# Patient Record
Sex: Female | Born: 1940 | Race: White | Hispanic: No | Smoking: Never smoker
Health system: Southern US, Community
[De-identification: ages and names within clinical notes are randomized; demographics above are authoritative.]

## PROBLEM LIST (undated history)

## (undated) DIAGNOSIS — R030 Elevated blood-pressure reading, without diagnosis of hypertension: Secondary | ICD-10-CM

## (undated) DIAGNOSIS — E559 Vitamin D deficiency, unspecified: Secondary | ICD-10-CM

## (undated) DIAGNOSIS — J45909 Unspecified asthma, uncomplicated: Secondary | ICD-10-CM

## (undated) DIAGNOSIS — T8859XA Other complications of anesthesia, initial encounter: Secondary | ICD-10-CM

## (undated) DIAGNOSIS — R7303 Prediabetes: Secondary | ICD-10-CM

## (undated) DIAGNOSIS — IMO0001 Reserved for inherently not codable concepts without codable children: Secondary | ICD-10-CM

## (undated) DIAGNOSIS — M1990 Unspecified osteoarthritis, unspecified site: Secondary | ICD-10-CM

## (undated) DIAGNOSIS — R011 Cardiac murmur, unspecified: Secondary | ICD-10-CM

## (undated) HISTORY — DX: Elevated blood-pressure reading, without diagnosis of hypertension: R03.0

## (undated) HISTORY — PX: FOOT SURGERY: SHX648

## (undated) HISTORY — DX: Prediabetes: R73.03

## (undated) HISTORY — DX: Reserved for inherently not codable concepts without codable children: IMO0001

## (undated) HISTORY — DX: Vitamin D deficiency, unspecified: E55.9

## (undated) HISTORY — PX: TONSILLECTOMY: SUR1361

## (undated) HISTORY — PX: VAGINAL HYSTERECTOMY: SUR661

## (undated) HISTORY — PX: OTHER SURGICAL HISTORY: SHX169

## (undated) HISTORY — PX: APPENDECTOMY: SHX54

---

## 1999-10-14 ENCOUNTER — Other Ambulatory Visit: Admission: RE | Admit: 1999-10-14 | Discharge: 1999-10-14 | Payer: Self-pay | Admitting: Gynecology

## 2000-04-08 ENCOUNTER — Encounter: Admission: RE | Admit: 2000-04-08 | Discharge: 2000-04-08 | Payer: Self-pay | Admitting: Gynecology

## 2000-04-08 ENCOUNTER — Encounter: Payer: Self-pay | Admitting: Gynecology

## 2000-08-04 ENCOUNTER — Encounter: Payer: Self-pay | Admitting: *Deleted

## 2000-08-04 ENCOUNTER — Ambulatory Visit (HOSPITAL_COMMUNITY): Admission: RE | Admit: 2000-08-04 | Discharge: 2000-08-04 | Payer: Self-pay | Admitting: *Deleted

## 2000-10-26 ENCOUNTER — Other Ambulatory Visit: Admission: RE | Admit: 2000-10-26 | Discharge: 2000-10-26 | Payer: Self-pay | Admitting: Gynecology

## 2001-04-11 ENCOUNTER — Encounter: Payer: Self-pay | Admitting: Gynecology

## 2001-04-11 ENCOUNTER — Encounter: Admission: RE | Admit: 2001-04-11 | Discharge: 2001-04-11 | Payer: Self-pay | Admitting: Gynecology

## 2001-10-30 ENCOUNTER — Other Ambulatory Visit: Admission: RE | Admit: 2001-10-30 | Discharge: 2001-10-30 | Payer: Self-pay | Admitting: Gynecology

## 2002-04-02 ENCOUNTER — Emergency Department (HOSPITAL_COMMUNITY): Admission: EM | Admit: 2002-04-02 | Discharge: 2002-04-02 | Payer: Self-pay | Admitting: Emergency Medicine

## 2002-04-19 ENCOUNTER — Encounter: Admission: RE | Admit: 2002-04-19 | Discharge: 2002-04-19 | Payer: Self-pay | Admitting: Gynecology

## 2002-04-19 ENCOUNTER — Encounter: Payer: Self-pay | Admitting: Gynecology

## 2003-05-06 ENCOUNTER — Encounter: Admission: RE | Admit: 2003-05-06 | Discharge: 2003-05-06 | Payer: Self-pay | Admitting: Internal Medicine

## 2003-05-06 ENCOUNTER — Encounter: Payer: Self-pay | Admitting: Internal Medicine

## 2004-12-02 ENCOUNTER — Ambulatory Visit (HOSPITAL_COMMUNITY): Admission: RE | Admit: 2004-12-02 | Discharge: 2004-12-02 | Payer: Self-pay | Admitting: Internal Medicine

## 2005-03-22 ENCOUNTER — Encounter: Admission: RE | Admit: 2005-03-22 | Discharge: 2005-03-22 | Payer: Self-pay | Admitting: Internal Medicine

## 2005-04-14 ENCOUNTER — Ambulatory Visit (HOSPITAL_COMMUNITY): Admission: RE | Admit: 2005-04-14 | Discharge: 2005-04-14 | Payer: Self-pay | Admitting: *Deleted

## 2005-04-14 ENCOUNTER — Encounter (INDEPENDENT_AMBULATORY_CARE_PROVIDER_SITE_OTHER): Payer: Self-pay | Admitting: Specialist

## 2005-12-07 ENCOUNTER — Ambulatory Visit (HOSPITAL_COMMUNITY): Admission: RE | Admit: 2005-12-07 | Discharge: 2005-12-07 | Payer: Self-pay | Admitting: Internal Medicine

## 2006-07-21 ENCOUNTER — Encounter: Admission: RE | Admit: 2006-07-21 | Discharge: 2006-07-21 | Payer: Self-pay | Admitting: Internal Medicine

## 2006-08-12 ENCOUNTER — Encounter: Admission: RE | Admit: 2006-08-12 | Discharge: 2006-08-12 | Payer: Self-pay | Admitting: Internal Medicine

## 2006-12-13 ENCOUNTER — Ambulatory Visit (HOSPITAL_COMMUNITY): Admission: RE | Admit: 2006-12-13 | Discharge: 2006-12-13 | Payer: Self-pay | Admitting: Internal Medicine

## 2007-12-15 ENCOUNTER — Ambulatory Visit (HOSPITAL_COMMUNITY): Admission: RE | Admit: 2007-12-15 | Discharge: 2007-12-15 | Payer: Self-pay | Admitting: Internal Medicine

## 2008-05-23 ENCOUNTER — Other Ambulatory Visit: Admission: RE | Admit: 2008-05-23 | Discharge: 2008-05-23 | Payer: Self-pay | Admitting: Internal Medicine

## 2008-05-29 ENCOUNTER — Encounter: Admission: RE | Admit: 2008-05-29 | Discharge: 2008-05-29 | Payer: Self-pay | Admitting: Internal Medicine

## 2008-12-17 ENCOUNTER — Ambulatory Visit (HOSPITAL_COMMUNITY): Admission: RE | Admit: 2008-12-17 | Discharge: 2008-12-17 | Payer: Self-pay | Admitting: Internal Medicine

## 2009-05-26 ENCOUNTER — Other Ambulatory Visit: Admission: RE | Admit: 2009-05-26 | Discharge: 2009-05-26 | Payer: Self-pay | Admitting: Internal Medicine

## 2009-12-18 ENCOUNTER — Ambulatory Visit (HOSPITAL_COMMUNITY): Admission: RE | Admit: 2009-12-18 | Discharge: 2009-12-18 | Payer: Self-pay | Admitting: Internal Medicine

## 2010-07-16 ENCOUNTER — Emergency Department (HOSPITAL_COMMUNITY): Admission: EM | Admit: 2010-07-16 | Discharge: 2010-01-27 | Payer: Self-pay | Admitting: Emergency Medicine

## 2010-11-16 ENCOUNTER — Other Ambulatory Visit (HOSPITAL_COMMUNITY): Payer: Self-pay | Admitting: Internal Medicine

## 2010-11-16 DIAGNOSIS — Z1231 Encounter for screening mammogram for malignant neoplasm of breast: Secondary | ICD-10-CM

## 2010-12-21 ENCOUNTER — Ambulatory Visit (HOSPITAL_COMMUNITY)
Admission: RE | Admit: 2010-12-21 | Discharge: 2010-12-21 | Disposition: A | Discharge status: Home / Self Care | Payer: Medicare Other | Source: Ambulatory Visit | Attending: Internal Medicine | Admitting: Internal Medicine

## 2010-12-21 DIAGNOSIS — Z1231 Encounter for screening mammogram for malignant neoplasm of breast: Secondary | ICD-10-CM | POA: Insufficient documentation

## 2010-12-25 NOTE — Op Note (Signed)
NAME:  Angelica Romero             ACCOUNT NO.:  [id number]   MEDICAL RECORD NO.:  [id number]          PATIENT TYPE:  AMB   LOCATION:  ENDO                         FACILITY:  [facility]   PHYSICIAN:  Georgiana Spinner, M.D.    DATE OF BIRTH:  May 29, 1941   DATE OF PROCEDURE:  04/14/2005  DATE OF DISCHARGE:                                 OPERATIVE REPORT   Upper endoscopy.   INDICATIONS:  GERD.   ANESTHESIA:  Demerol 50, Versed 4 mg.   PROCEDURE:  With patient mildly sedated in the left lateral decubitus  position, the Olympus videoscopic endoscope was inserted in the mouth,  passed under direct vision through the esophagus which appeared normal until  we reached the distal esophagus, and there were changes of esophagitis with  an ulcer and erythema, photographed and biopsied.  We entered into the  stomach.  Fundus, body, antrum, duodenal bulb, second portion of duodenum  were visualized.  From this point, the endoscope was slowly withdrawn,  taking circumferential views of duodenal mucosa until the endoscope had been  pulled back in the stomach, placed in retroflexion to view the stomach from  below.  The endoscope was straightened, withdrawn, taking circumferential  views of the remaining gastric and esophageal mucosa.  The patient's vital  signs and pulse oximeter remained stable.  The patient tolerated the  procedure well without apparent complications.   FINDINGS:  Esophagitis, biopsied.  Await biopsy report.  The patient will  call me for results and follow-up with me as an outpatient.           ______________________________  Georgiana Spinner, M.D.     GMO/MEDQ  D:  04/14/2005  T:  04/14/2005  Job:  [id number]

## 2010-12-25 NOTE — Op Note (Signed)
NAME:  Angelica Romero             ACCOUNT NO.:  [id number]   MEDICAL RECORD NO.:  [id number]          PATIENT TYPE:  AMB   LOCATION:  ENDO                         FACILITY:  [facility]   PHYSICIAN:  Georgiana Spinner, M.D.    DATE OF BIRTH:  Dec 15, 1940   DATE OF PROCEDURE:  04/14/2005  DATE OF DISCHARGE:                                 OPERATIVE REPORT   PROCEDURE:  Colonoscopy.   INDICATIONS:  Colon cancer screening.   ANESTHESIA:  Demerol 30, Versed 2 mg additionally.   DESCRIPTION OF PROCEDURE:  With the patient mildly sedated in the left  lateral decubitus position, the Olympus videoscopic colonoscope was inserted  into the rectum and passed under direct vision to the cecum identified by  the ileocecal valve and appendiceal orifice both of which were photographed.  From this point, the colonoscope was slowly withdrawn taking circumferential  views of colonic mucosa stopping in the rectum which appeared normal on  direct and showed hemorrhoids on retroflexed view. The endoscope was  straightened and withdrawn. The patient's vital signs and pulse oximeter  remained stable. The patient tolerated the procedure well without apparent  complications.   FINDINGS:  Internal hemorrhoids otherwise an unremarkable colonoscopic  examination to the cecum.   PLAN:  Consider repeat examination in 5-10 years.           ______________________________  Georgiana Spinner, M.D.     GMO/MEDQ  D:  04/14/2005  T:  04/14/2005  Job:  [id number]

## 2011-11-17 ENCOUNTER — Other Ambulatory Visit (HOSPITAL_COMMUNITY): Payer: Self-pay | Admitting: Internal Medicine

## 2011-11-17 DIAGNOSIS — Z1231 Encounter for screening mammogram for malignant neoplasm of breast: Secondary | ICD-10-CM

## 2011-12-22 ENCOUNTER — Ambulatory Visit (HOSPITAL_COMMUNITY)
Admission: RE | Admit: 2011-12-22 | Discharge: 2011-12-22 | Disposition: A | Discharge status: Home / Self Care | Payer: Medicare Other | Source: Ambulatory Visit | Attending: Internal Medicine | Admitting: Internal Medicine

## 2011-12-22 DIAGNOSIS — Z1231 Encounter for screening mammogram for malignant neoplasm of breast: Secondary | ICD-10-CM | POA: Insufficient documentation

## 2012-11-14 ENCOUNTER — Other Ambulatory Visit (HOSPITAL_COMMUNITY): Payer: Self-pay | Admitting: Internal Medicine

## 2012-11-14 DIAGNOSIS — Z1231 Encounter for screening mammogram for malignant neoplasm of breast: Secondary | ICD-10-CM

## 2012-12-25 ENCOUNTER — Ambulatory Visit (HOSPITAL_COMMUNITY)
Admission: RE | Admit: 2012-12-25 | Discharge: 2012-12-25 | Disposition: A | Discharge status: Home / Self Care | Payer: Medicare Other | Source: Ambulatory Visit | Attending: Internal Medicine | Admitting: Internal Medicine

## 2012-12-25 DIAGNOSIS — Z1231 Encounter for screening mammogram for malignant neoplasm of breast: Secondary | ICD-10-CM

## 2013-12-05 ENCOUNTER — Other Ambulatory Visit (HOSPITAL_COMMUNITY): Payer: Self-pay | Admitting: Internal Medicine

## 2013-12-05 DIAGNOSIS — Z1231 Encounter for screening mammogram for malignant neoplasm of breast: Secondary | ICD-10-CM

## 2013-12-27 ENCOUNTER — Ambulatory Visit (HOSPITAL_COMMUNITY)
Admission: RE | Admit: 2013-12-27 | Discharge: 2013-12-27 | Disposition: A | Discharge status: Home / Self Care | Payer: Medicare Other | Source: Ambulatory Visit | Attending: Internal Medicine | Admitting: Internal Medicine

## 2013-12-27 DIAGNOSIS — Z1231 Encounter for screening mammogram for malignant neoplasm of breast: Secondary | ICD-10-CM | POA: Insufficient documentation

## 2014-07-01 ENCOUNTER — Other Ambulatory Visit: Payer: Self-pay | Admitting: Internal Medicine

## 2014-07-01 DIAGNOSIS — I779 Disorder of arteries and arterioles, unspecified: Secondary | ICD-10-CM

## 2014-07-15 ENCOUNTER — Ambulatory Visit
Admission: RE | Admit: 2014-07-15 | Discharge: 2014-07-15 | Disposition: A | Discharge status: Home / Self Care | Payer: Medicare Other | Source: Ambulatory Visit | Attending: Internal Medicine | Admitting: Internal Medicine

## 2014-07-15 DIAGNOSIS — I779 Disorder of arteries and arterioles, unspecified: Secondary | ICD-10-CM

## 2014-08-28 DIAGNOSIS — H524 Presbyopia: Secondary | ICD-10-CM | POA: Diagnosis not present

## 2014-08-28 DIAGNOSIS — H40013 Open angle with borderline findings, low risk, bilateral: Secondary | ICD-10-CM | POA: Diagnosis not present

## 2014-08-28 DIAGNOSIS — H1851 Endothelial corneal dystrophy: Secondary | ICD-10-CM | POA: Diagnosis not present

## 2014-09-05 DIAGNOSIS — M6740 Ganglion, unspecified site: Secondary | ICD-10-CM | POA: Diagnosis not present

## 2014-09-05 DIAGNOSIS — L570 Actinic keratosis: Secondary | ICD-10-CM | POA: Diagnosis not present

## 2014-09-05 DIAGNOSIS — L820 Inflamed seborrheic keratosis: Secondary | ICD-10-CM | POA: Diagnosis not present

## 2014-12-02 ENCOUNTER — Other Ambulatory Visit (HOSPITAL_COMMUNITY): Payer: Self-pay | Admitting: Family Medicine

## 2014-12-02 DIAGNOSIS — Z1231 Encounter for screening mammogram for malignant neoplasm of breast: Secondary | ICD-10-CM

## 2014-12-30 ENCOUNTER — Ambulatory Visit (HOSPITAL_COMMUNITY): Payer: Medicare Other

## 2014-12-30 ENCOUNTER — Ambulatory Visit (HOSPITAL_COMMUNITY)
Admission: RE | Admit: 2014-12-30 | Discharge: 2014-12-30 | Disposition: A | Discharge status: Home / Self Care | Payer: Medicare Other | Source: Ambulatory Visit | Attending: Family Medicine | Admitting: Family Medicine

## 2014-12-30 DIAGNOSIS — Z1231 Encounter for screening mammogram for malignant neoplasm of breast: Secondary | ICD-10-CM | POA: Diagnosis not present

## 2015-01-02 ENCOUNTER — Ambulatory Visit (INDEPENDENT_AMBULATORY_CARE_PROVIDER_SITE_OTHER): Payer: Medicare Other | Admitting: Internal Medicine

## 2015-01-02 ENCOUNTER — Encounter: Payer: Self-pay | Admitting: Internal Medicine

## 2015-01-02 VITALS — BP 140/78 | HR 76 | Temp 97.8°F | Resp 16 | Ht 62.25 in | Wt 193.6 lb

## 2015-01-02 DIAGNOSIS — E7800 Pure hypercholesterolemia, unspecified: Secondary | ICD-10-CM

## 2015-01-02 DIAGNOSIS — R030 Elevated blood-pressure reading, without diagnosis of hypertension: Secondary | ICD-10-CM

## 2015-01-02 DIAGNOSIS — D509 Iron deficiency anemia, unspecified: Secondary | ICD-10-CM

## 2015-01-02 DIAGNOSIS — R0989 Other specified symptoms and signs involving the circulatory and respiratory systems: Secondary | ICD-10-CM | POA: Insufficient documentation

## 2015-01-02 DIAGNOSIS — R7309 Other abnormal glucose: Secondary | ICD-10-CM

## 2015-01-02 DIAGNOSIS — Z79899 Other long term (current) drug therapy: Secondary | ICD-10-CM | POA: Diagnosis not present

## 2015-01-02 DIAGNOSIS — E559 Vitamin D deficiency, unspecified: Secondary | ICD-10-CM | POA: Diagnosis not present

## 2015-01-02 DIAGNOSIS — E60 Dietary zinc deficiency: Secondary | ICD-10-CM

## 2015-01-02 DIAGNOSIS — IMO0001 Reserved for inherently not codable concepts without codable children: Secondary | ICD-10-CM

## 2015-01-02 LAB — CBC WITH DIFFERENTIAL/PLATELET
BASOS PCT: 1 % (ref 0–1)
Basophils Absolute: 0.1 10*3/uL (ref 0.0–0.1)
EOS ABS: 0.1 10*3/uL (ref 0.0–0.7)
EOS PCT: 2 % (ref 0–5)
HEMATOCRIT: 44.2 % (ref 36.0–46.0)
Hemoglobin: 15.1 g/dL — ABNORMAL HIGH (ref 12.0–15.0)
LYMPHS ABS: 2.3 10*3/uL (ref 0.7–4.0)
Lymphocytes Relative: 43 % (ref 12–46)
MCH: 30.4 pg (ref 26.0–34.0)
MCHC: 34.2 g/dL (ref 30.0–36.0)
MCV: 88.9 fL (ref 78.0–100.0)
MONO ABS: 0.6 10*3/uL (ref 0.1–1.0)
MPV: 9.6 fL (ref 8.6–12.4)
Monocytes Relative: 12 % (ref 3–12)
NEUTROS ABS: 2.3 10*3/uL (ref 1.7–7.7)
Neutrophils Relative %: 42 % — ABNORMAL LOW (ref 43–77)
Platelets: 246 10*3/uL (ref 150–400)
RBC: 4.97 MIL/uL (ref 3.87–5.11)
RDW: 14.8 % (ref 11.5–15.5)
WBC: 5.4 10*3/uL (ref 4.0–10.5)

## 2015-01-02 LAB — BASIC METABOLIC PANEL WITH GFR
BUN: 16 mg/dL (ref 6–23)
CALCIUM: 9.9 mg/dL (ref 8.4–10.5)
CHLORIDE: 107 meq/L (ref 96–112)
CO2: 26 mEq/L (ref 19–32)
Creat: 0.75 mg/dL (ref 0.50–1.10)
GFR, Est African American: >89 mL/min
GFR, Est Non African American: 78 mL/min
GLUCOSE: 98 mg/dL (ref 70–99)
POTASSIUM: 4.4 meq/L (ref 3.5–5.3)
Sodium: 142 mEq/L (ref 135–145)

## 2015-01-02 LAB — HEPATIC FUNCTION PANEL
ALBUMIN: 4.5 g/dL (ref 3.5–5.2)
ALK PHOS: 55 U/L (ref 39–117)
ALT: 30 U/L (ref 0–35)
AST: 31 U/L (ref 0–37)
Bilirubin, Direct: 0.1 mg/dL (ref 0.0–0.3)
Indirect Bilirubin: 0.4 mg/dL (ref 0.2–1.2)
TOTAL PROTEIN: 7.2 g/dL (ref 6.0–8.3)
Total Bilirubin: 0.5 mg/dL (ref 0.2–1.2)

## 2015-01-02 LAB — LIPID PANEL
CHOLESTEROL: 135 mg/dL (ref 0–200)
HDL: 47 mg/dL (ref >=46)
LDL Cholesterol: 77 mg/dL (ref 0–99)
Total CHOL/HDL Ratio: 2.9 Ratio
Triglycerides: 55 mg/dL (ref <150)
VLDL: 11 mg/dL (ref 0–40)

## 2015-01-02 LAB — TSH: TSH: 1.326 u[IU]/mL (ref 0.350–4.500)

## 2015-01-02 LAB — IRON AND TIBC
%SAT: 33 % (ref 20–55)
Iron: 99 ug/dL (ref 42–145)
TIBC: 302 ug/dL (ref 250–470)
UIBC: 203 ug/dL (ref 125–400)

## 2015-01-02 LAB — MAGNESIUM: Magnesium: 2.3 mg/dL (ref 1.5–2.5)

## 2015-01-02 NOTE — Patient Instructions (Signed)
Recommend Adult Low dose Aspirin or baby Aspirin 81 mg daily   To reduce risk of Colon Cancer 20 %,   Skin Cancer 26 % ,   Melanoma 46%   and   Pancreatic cancer 60%  ++++++++++++++++++  Vitamin D goal is between 70-100.   Please make sure that you are taking your Vitamin D as directed.   It is very important as a natural anti-inflammatory   helping hair, skin, and nails, as well as reducing stroke and heart attack risk.   It helps your bones and helps with mood.  It also decreases numerous cancer risks so please take it as directed.   Low Vit D is associated with a 200-300% higher risk for CANCER   and 200-300% higher risk for HEART   ATTACK  &  STROKE.    ......................................  It is also associated with higher death rate at younger ages,   autoimmune diseases like Rheumatoid arthritis, Lupus, Multiple Sclerosis.     Also many other serious conditions, like depression, Alzheimer's  Dementia, infertility, muscle aches, fatigue, fibromyalgia - just to name a few.  +++++++++++++++++++    Recommend the book "The END of DIETING" by Dr Joel Fuhrman   & the book "The END of DIABETES " by Dr Joel Fuhrman  At [employer] - get book & Audio CD's     Being diabetic has a  300% increased risk for heart attack, stroke, cancer, and alzheimer- type vascular dementia. It is very important that you work harder with diet by avoiding all foods that are white. Avoid white rice (brown & wild rice is OK), white potatoes (sweetpotatoes in moderation is OK), White bread or wheat bread or anything made out of white flour like bagels, donuts, rolls, buns, biscuits, cakes, pastries, cookies, pizza crust, and pasta (made from white flour & egg whites) - vegetarian pasta or spinach or wheat pasta is OK. Multigrain breads like Arnold's or [employer], or multigrain sandwich thins or flatbreads.  Diet, exercise and weight loss can reverse and cure diabetes in the early  stages.  Diet, exercise and weight loss is very important in the control and prevention of complications of diabetes which affects every system in your body, ie. Brain - dementia/stroke, eyes - glaucoma/blindness, heart - heart attack/heart failure, kidneys - dialysis, stomach - gastric paralysis, intestines - malabsorption, nerves - severe painful neuritis, circulation - gangrene & loss of a leg(s), and finally cancer and Alzheimers.    I recommend avoid fried & greasy foods,  sweets/candy, white rice (brown or wild rice or Quinoa is OK), white potatoes (sweet potatoes are OK) - anything made from white flour - bagels, doughnuts, rolls, buns, biscuits,white and wheat breads, pizza crust and traditional pasta made of white flour & egg white(vegetarian pasta or spinach or wheat pasta is OK).  Multi-grain bread is OK - like multi-grain flat bread or sandwich thins. Avoid alcohol in excess. Exercise is also important.    Eat all the vegetables you want - avoid meat, especially red meat and dairy - especially cheese.  Cheese is the most concentrated form of trans-fats which is the worst thing to clog up our arteries. Veggie cheese is OK which can be found in the fresh produce section at [employer] or [employer] or [employer]  ++++++++++++++++++++++++++  Preventive Care for Adults  A healthy lifestyle and preventive care can promote health and wellness. Preventive health guidelines for women include the following key practices.  A routine yearly physical is   a good way to check with your health care provider about your health and preventive screening. It is a chance to share any concerns and updates on your health and to receive a thorough exam.  Visit your dentist for a routine exam and preventive care every 6 months. Brush your teeth twice a day and floss once a day. Good oral hygiene prevents tooth decay and gum disease.  The frequency of eye exams is based on your age, health, family medical  history, use of contact lenses, and other factors. Follow your health care provider's recommendations for frequency of eye exams.  Eat a healthy diet. Foods like vegetables, fruits, whole grains, low-fat dairy products, and lean protein foods contain the nutrients you need without too many calories. Decrease your intake of foods high in solid fats, added sugars, and salt. Eat the right amount of calories for you.Get information about a proper diet from your health care provider, if necessary.  Regular physical exercise is one of the most important things you can do for your health. Most adults should get at least 150 minutes of moderate-intensity exercise (any activity that increases your heart rate and causes you to sweat) each week. In addition, most adults need muscle-strengthening exercises on 2 or more days a week.  Maintain a healthy weight. The body mass index (BMI) is a screening tool to identify possible weight problems. It provides an estimate of body fat based on height and weight. Your health care provider can find your BMI and can help you achieve or maintain a healthy weight.For adults 20 years and older:  A BMI below 18.5 is considered underweight.  A BMI of 18.5 to 24.9 is normal.  A BMI of 25 to 29.9 is considered overweight.  A BMI of 30 and above is considered obese.  Maintain normal blood lipids and cholesterol levels by exercising and minimizing your intake of saturated fat. Eat a balanced diet with plenty of fruit and vegetables. If your lipid or cholesterol levels are high, you are over 50, or you are at high risk for heart disease, you may need your cholesterol levels checked more frequently.Ongoing high lipid and cholesterol levels should be treated with medicines if diet and exercise are not working.  If you smoke, find out from your health care provider how to quit. If you do not use tobacco, do not start.  Lung cancer screening is recommended for adults aged 55-80  years who are at high risk for developing lung cancer because of a history of smoking. A yearly low-dose CT scan of the lungs is recommended for people who have at least a 30-pack-year history of smoking and are a current smoker or have quit within the past 15 years. A pack year of smoking is smoking an average of 1 pack of cigarettes a day for 1 year (for example: 1 pack a day for 30 years or 2 packs a day for 15 years). Yearly screening should continue until the smoker has stopped smoking for at least 15 years. Yearly screening should be stopped for people who develop a health problem that would prevent them from having lung cancer treatment.  Avoid use of street drugs. Do not share needles with anyone. Ask for help if you need support or instructions about stopping the use of drugs.  High blood pressure causes heart disease and increases the risk of stroke.  Ongoing high blood pressure should be treated with medicines if weight loss and exercise do not work.  If   you are 55-79 years old, ask your health care provider if you should take aspirin to prevent strokes.  Diabetes screening involves taking a blood sample to check your fasting blood sugar level. This should be done once every 3 years, after age 45, if you are within normal weight and without risk factors for diabetes. Testing should be considered at a younger age or be carried out more frequently if you are overweight and have at least 1 risk factor for diabetes.  Breast cancer screening is essential preventive care for women. You should practice "breast self-awareness." This means understanding the normal appearance and feel of your breasts and may include breast self-examination. Any changes detected, no matter how small, should be reported to a health care provider. Women in their 20s and 30s should have a clinical breast exam (CBE) by a health care provider as part of a regular health exam every 1 to 3 years. After age 40, women should have a  CBE every year. Starting at age 40, women should consider having a mammogram (breast X-ray test) every year. Women who have a family history of breast cancer should talk to their health care provider about genetic screening. Women at a high risk of breast cancer should talk to their health care providers about having an MRI and a mammogram every year.  Breast cancer gene (BRCA)-related cancer risk assessment is recommended for women who have family members with BRCA-related cancers. BRCA-related cancers include breast, ovarian, tubal, and peritoneal cancers. Having family members with these cancers may be associated with an increased risk for harmful changes (mutations) in the breast cancer genes BRCA1 and BRCA2. Results of the assessment will determine the need for genetic counseling and BRCA1 and BRCA2 testing.  Routine pelvic exams to screen for cancer are no longer recommended for nonpregnant women who are considered low risk for cancer of the pelvic organs (ovaries, uterus, and vagina) and who do not have symptoms. Ask your health care provider if a screening pelvic exam is right for you.  If you have had past treatment for cervical cancer or a condition that could lead to cancer, you need Pap tests and screening for cancer for at least 20 years after your treatment. If Pap tests have been discontinued, your risk factors (such as having a new sexual partner) need to be reassessed to determine if screening should be resumed. Some women have medical problems that increase the chance of getting cervical cancer. In these cases, your health care provider may recommend more frequent screening and Pap tests.    Colorectal cancer can be detected and often prevented. Most routine colorectal cancer screening begins at the age of 50 years and continues through age 75 years. However, your health care provider may recommend screening at an earlier age if you have risk factors for colon cancer. On a yearly basis,  your health care provider may provide home test kits to check for hidden blood in the stool. Use of a small camera at the end of a tube, to directly examine the colon (sigmoidoscopy or colonoscopy), can detect the earliest forms of colorectal cancer. Talk to your health care provider about this at age 50, when routine screening begins. Direct exam of the colon should be repeated every 5-10 years through age 75 years, unless early forms of pre-cancerous polyps or small growths are found.  Osteoporosis is a disease in which the bones lose minerals and strength with aging. This can result in serious bone fractures or breaks. The   risk of osteoporosis can be identified using a bone density scan. Women ages 65 years and over and women at risk for fractures or osteoporosis should discuss screening with their health care providers. Ask your health care provider whether you should take a calcium supplement or vitamin D to reduce the rate of osteoporosis.  Menopause can be associated with physical symptoms and risks. Hormone replacement therapy is available to decrease symptoms and risks. You should talk to your health care provider about whether hormone replacement therapy is right for you.  Use sunscreen. Apply sunscreen liberally and repeatedly throughout the day. You should seek shade when your shadow is shorter than you. Protect yourself by wearing long sleeves, pants, a wide-brimmed hat, and sunglasses year round, whenever you are outdoors.  Once a month, do a whole body skin exam, using a mirror to look at the skin on your back. Tell your health care provider of new moles, moles that have irregular borders, moles that are larger than a pencil eraser, or moles that have changed in shape or color.  Stay current with required vaccines (immunizations).  Influenza vaccine. All adults should be immunized every year.  Tetanus, diphtheria, and acellular pertussis (Td, Tdap) vaccine. Pregnant women should receive  1 dose of Tdap vaccine during each pregnancy. The dose should be obtained regardless of the length of time since the last dose. Immunization is preferred during the 27th-36th week of gestation. An adult who has not previously received Tdap or who does not know her vaccine status should receive 1 dose of Tdap. This initial dose should be followed by tetanus and diphtheria toxoids (Td) booster doses every 10 years. Adults with an unknown or incomplete history of completing a 3-dose immunization series with Td-containing vaccines should begin or complete a primary immunization series including a Tdap dose. Adults should receive a Td booster every 10 years.    Zoster vaccine. One dose is recommended for adults aged 60 years or older unless certain conditions are present.    Pneumococcal 13-valent conjugate (PCV13) vaccine. When indicated, a person who is uncertain of her immunization history and has no record of immunization should receive the PCV13 vaccine. An adult aged 19 years or older who has certain medical conditions and has not been previously immunized should receive 1 dose of PCV13 vaccine. This PCV13 should be followed with a dose of pneumococcal polysaccharide (PPSV23) vaccine. The PPSV23 vaccine dose should be obtained at least 8 weeks after the dose of PCV13 vaccine. An adult aged 19 years or older who has certain medical conditions and previously received 1 or more doses of PPSV23 vaccine should receive 1 dose of PCV13. The PCV13 vaccine dose should be obtained 1 or more years after the last PPSV23 vaccine dose.    Pneumococcal polysaccharide (PPSV23) vaccine. When PCV13 is also indicated, PCV13 should be obtained first. All adults aged 65 years and older should be immunized. An adult younger than age 65 years who has certain medical conditions should be immunized. Any person who resides in a nursing home or long-term care facility should be immunized. An adult smoker should be immunized.  People with an immunocompromised condition and certain other conditions should receive both PCV13 and PPSV23 vaccines. People with human immunodeficiency virus (HIV) infection should be immunized as soon as possible after diagnosis. Immunization during chemotherapy or radiation therapy should be avoided. Routine use of PPSV23 vaccine is not recommended for American Indians, [place] Natives, or people younger than 65 years unless there are medical   conditions that require PPSV23 vaccine. When indicated, people who have unknown immunization and have no record of immunization should receive PPSV23 vaccine. One-time revaccination 5 years after the first dose of PPSV23 is recommended for people aged 19-64 years who have chronic kidney failure, nephrotic syndrome, asplenia, or immunocompromised conditions. People who received 1-2 doses of PPSV23 before age 9 years should receive another dose of PPSV23 vaccine at age 62 years or later if at least 5 years have passed since the previous dose. Doses of PPSV23 are not needed for people immunized with PPSV23 at or after age 53 years.   Preventive Services / Frequency  Ages 32 years and over  Blood pressure check.  Lipid and cholesterol check.  Lung cancer screening. / Every year if you are aged 71-80 years and have a 30-pack-year history of smoking and currently smoke or have quit within the past 15 years. Yearly screening is stopped once you have quit smoking for at least 15 years or develop a health problem that would prevent you from having lung cancer treatment.  Clinical breast exam.** / Every year after age 65 years.  BRCA-related cancer risk assessment.** / For women who have family members with a BRCA-related cancer (breast, ovarian, tubal, or peritoneal cancers).  Mammogram.** / Every year beginning at age 46 years and continuing for as long as you are in good health. Consult with your health care provider.  Pap test.** / Every 3 years starting at  age 71 years through age 47 or 50 years with 3 consecutive normal Pap tests. Testing can be stopped between 65 and 70 years with 3 consecutive normal Pap tests and no abnormal Pap or HPV tests in the past 10 years.  Fecal occult blood test (FOBT) of stool. / Every year beginning at age 44 years and continuing until age 73 years. You may not need to do this test if you get a colonoscopy every 10 years.  Flexible sigmoidoscopy or colonoscopy.** / Every 5 years for a flexible sigmoidoscopy or every 10 years for a colonoscopy beginning at age 37 years and continuing until age 2 years.  Hepatitis C blood test.** / For all people born from [birth year] through 1967 and any individual with known risks for hepatitis C.  Osteoporosis screening.** / A one-time screening for women ages 9 years and over and women at risk for fractures or osteoporosis.  Skin self-exam. / Monthly.  Influenza vaccine. / Every year.  Tetanus, diphtheria, and acellular pertussis (Tdap/Td) vaccine.** / 1 dose of Td every 10 years.  Zoster vaccine.** / 1 dose for adults aged 31 years or older.  Pneumococcal 13-valent conjugate (PCV13) vaccine.** / Consult your health care provider.  Pneumococcal polysaccharide (PPSV23) vaccine.** / 1 dose for all adults aged 32 years and older. Screening for abdominal aortic aneurysm (AAA)  by ultrasound is recommended for people who have history of high blood pressure or who are current or former smokers.

## 2015-01-02 NOTE — Progress Notes (Signed)
Patient ID: Angelica Romero, female   DOB: [birth year], 74 y.o.   MRN: [id number]   This very nice 74 y.o. [name] presents to establish care.    Patient is on no treatment  for HTN, but is norted to have an elevated BP 140/78. She doe relate a very remote hx/o elevated BP during a pregnancy 40 +/- yrs ago. Patient has had no complaints of any cardiac type chest pain, palpitations, dyspnea/orthopnea/PND, dizziness, claudication, or dependent edema. She does not exercise.    Patient has limited insight to her medical status as to whether she's ever gad elevated Cholesterol, glucose or low Vitamin D.  Does relate 2 surgeries by Dr Ernesto Rutherford and in Dec 2015 he ordered Head CT scan which alleged was negative.   Current meds include Biotin 5000 mcg daily, Aleve 220mg  prn, bASA 81 mg daily, and Mag 400 mg daily.  Allergies  Allergen Reactions  . Codeine Nausea And Vomiting  . Hydrocodone Nausea And Vomiting  . Omeprazole Nausea And Vomiting   PMHx:   Immunization History  Administered Date(s) Administered  . Influenza-Unspecified 04/09/2014   FHx:    Non contributory  SHx:    Non contributory Systems Review:  Constitutional: Denies fever, chills, wt changes, headaches, insomnia, fatigue, night sweats, change in appetite. Eyes: Denies redness, blurred vision, diplopia, discharge, itchy, watery eyes.  ENT: Denies discharge, congestion, post nasal drip, epistaxis, sore throat, earache, hearing loss, dental pain, tinnitus, vertigo, sinus pain, snoring.  CV: Denies chest pain, palpitations, irregular heartbeat, syncope, dyspnea, diaphoresis, orthopnea, PND, claudication or edema. Respiratory: denies cough, dyspnea, DOE, pleurisy, hoarseness, laryngitis, wheezing.  Gastrointestinal: Denies dysphagia, odynophagia, heartburn, reflux, water brash, abdominal pain or cramps, nausea, vomiting, bloating, diarrhea, constipation, hematemesis, melena, hematochezia  or hemorrhoids. Genitourinary: Denies dysuria,  frequency, urgency, nocturia, hesitancy, discharge, hematuria or flank pain. Musculoskeletal: Denies arthralgias, myalgias, stiffness, jt. swelling, pain, limping or strain/sprain.  Skin: Denies pruritus, rash, hives, warts, acne, eczema or change in skin lesion(s). Neuro: No weakness, tremor, incoordination, spasms, paresthesia or pain. Psychiatric: Denies confusion, memory loss or sensory loss. Endo: Denies change in weight, skin or hair change.  Heme/Lymph: No excessive bleeding, bruising or enlarged lymph nodes.  Physical Exam  BP 140/78   Pulse 76  Temp 97.8 F  Resp 16  Ht 5' 2.25"   Wt 193 lb 9.6 oz     BMI 35.13   Appears over nourished and in no distress.  Eyes: PERRLA, EOMs, conjunctiva no swelling or erythema. Sinuses: No frontal/maxillary tenderness ENT/Mouth: EAC's clear, TM's nl w/o erythema, bulging. Nares clear w/o erythema, swelling, exudates. Oropharynx clear without erythema or exudates. Oral hygiene is good. Tongue normal, non obstructing. Hearing intact.  Neck: Supple. Thyroid nl. Car 2+/2+ without bruits, nodes or JVD. Chest: Respirations nl with BS clear & equal w/o rales, rhonchi, wheezing or stridor.  Cor: Heart sounds normal w/ regular rate and rhythm without sig. murmurs, gallops, clicks, or rubs. Peripheral pulses normal and equal  without edema.  Abdomen: Soft & bowel sounds normal. Non-tender w/o guarding, rebound, hernias, masses, or organomegaly.  Lymphatics: Unremarkable.  Musculoskeletal: Full ROM all peripheral extremities, joint stability, 5/5 strength, and normal gait.  Skin: Warm, dry without exposed rashes, lesions or ecchymosis apparent. Patient relates concern over increased hair loss.  Neuro: Cranial nerves intact, reflexes equal bilaterally. Sensory-motor testing grossly intact. Tendon reflexes grossly intact.  Pysch: Alert & oriented x 3.  Insight and judgement nl & appropriate. No ideations.  Assessment and Plan:  1. Elevated BP  -  TSH  2. Elevated cholesterol  - Lipid panel  3. Abnormal glucose  - Hemoglobin A1c - Insulin, random  4. Anemia, iron deficiency  - Iron and TIBC  5. Vitamin D deficiency  - Vit D  25 hydroxy   6. Zinc deficiency  - Zinc  7. Medication management  - CBC with Differential/Platelet - BASIC METABOLIC PANEL WITH GFR - Hepatic function panel - Magnesium   Recommended regular exercise, BP monitoring, weight control, and discussed med and SE's. Recommended labs to assess and monitor clinical status. Further disposition pending results of labs. Over 30 minutes of exam, counseling, chart review was performed

## 2015-01-03 LAB — HEMOGLOBIN A1C
Hgb A1c MFr Bld: 5.9 % — ABNORMAL HIGH (ref <5.7)
Mean Plasma Glucose: 123 mg/dL — ABNORMAL HIGH (ref <117)

## 2015-01-03 LAB — INSULIN, RANDOM: Insulin: 15 u[IU]/mL (ref 2.0–19.6)

## 2015-01-03 LAB — VITAMIN D 25 HYDROXY (VIT D DEFICIENCY, FRACTURES): VIT D 25 HYDROXY: 38 ng/mL (ref 30–100)

## 2015-01-07 LAB — ZINC: Zinc: 87 ug/dL (ref 60–130)

## 2015-01-14 ENCOUNTER — Encounter (INDEPENDENT_AMBULATORY_CARE_PROVIDER_SITE_OTHER): Payer: Self-pay

## 2015-02-10 ENCOUNTER — Encounter: Payer: Self-pay | Admitting: *Deleted

## 2015-05-29 DIAGNOSIS — H353130 Nonexudative age-related macular degeneration, bilateral, stage unspecified: Secondary | ICD-10-CM | POA: Diagnosis not present

## 2015-05-29 DIAGNOSIS — Z961 Presence of intraocular lens: Secondary | ICD-10-CM | POA: Diagnosis not present

## 2015-05-29 DIAGNOSIS — H40013 Open angle with borderline findings, low risk, bilateral: Secondary | ICD-10-CM | POA: Diagnosis not present

## 2015-05-29 DIAGNOSIS — H35033 Hypertensive retinopathy, bilateral: Secondary | ICD-10-CM | POA: Diagnosis not present

## 2015-07-31 ENCOUNTER — Ambulatory Visit (INDEPENDENT_AMBULATORY_CARE_PROVIDER_SITE_OTHER): Payer: Medicare Other | Admitting: Internal Medicine

## 2015-07-31 ENCOUNTER — Encounter: Payer: Self-pay | Admitting: Internal Medicine

## 2015-07-31 VITALS — BP 156/82 | HR 64 | Temp 97.4°F | Resp 16 | Ht 62.0 in | Wt 187.6 lb

## 2015-07-31 DIAGNOSIS — R7309 Other abnormal glucose: Secondary | ICD-10-CM | POA: Insufficient documentation

## 2015-07-31 DIAGNOSIS — R030 Elevated blood-pressure reading, without diagnosis of hypertension: Secondary | ICD-10-CM

## 2015-07-31 DIAGNOSIS — IMO0001 Reserved for inherently not codable concepts without codable children: Secondary | ICD-10-CM

## 2015-07-31 DIAGNOSIS — Z13220 Encounter for screening for lipoid disorders: Secondary | ICD-10-CM | POA: Diagnosis not present

## 2015-07-31 DIAGNOSIS — Z1331 Encounter for screening for depression: Secondary | ICD-10-CM

## 2015-07-31 DIAGNOSIS — E559 Vitamin D deficiency, unspecified: Secondary | ICD-10-CM | POA: Insufficient documentation

## 2015-07-31 DIAGNOSIS — Z23 Encounter for immunization: Secondary | ICD-10-CM

## 2015-07-31 DIAGNOSIS — Z79899 Other long term (current) drug therapy: Secondary | ICD-10-CM | POA: Diagnosis not present

## 2015-07-31 DIAGNOSIS — R7303 Prediabetes: Secondary | ICD-10-CM

## 2015-07-31 DIAGNOSIS — R6889 Other general symptoms and signs: Secondary | ICD-10-CM

## 2015-07-31 DIAGNOSIS — Z9181 History of falling: Secondary | ICD-10-CM

## 2015-07-31 DIAGNOSIS — Z0001 Encounter for general adult medical examination with abnormal findings: Secondary | ICD-10-CM

## 2015-07-31 DIAGNOSIS — Z1212 Encounter for screening for malignant neoplasm of rectum: Secondary | ICD-10-CM

## 2015-07-31 LAB — CBC WITH DIFFERENTIAL/PLATELET
BASOS PCT: 1 % (ref 0–1)
Basophils Absolute: 0.1 10*3/uL (ref 0.0–0.1)
Eosinophils Absolute: 0.2 10*3/uL (ref 0.0–0.7)
Eosinophils Relative: 3 % (ref 0–5)
HCT: 40.3 % (ref 36.0–46.0)
HEMOGLOBIN: 14.1 g/dL (ref 12.0–15.0)
Lymphocytes Relative: 35 % (ref 12–46)
Lymphs Abs: 2.3 10*3/uL (ref 0.7–4.0)
MCH: 31.1 pg (ref 26.0–34.0)
MCHC: 35 g/dL (ref 30.0–36.0)
MCV: 88.8 fL (ref 78.0–100.0)
MONOS PCT: 10 % (ref 3–12)
MPV: 9.9 fL (ref 8.6–12.4)
Monocytes Absolute: 0.7 10*3/uL (ref 0.1–1.0)
NEUTROS ABS: 3.3 10*3/uL (ref 1.7–7.7)
NEUTROS PCT: 51 % (ref 43–77)
Platelets: 262 10*3/uL (ref 150–400)
RBC: 4.54 MIL/uL (ref 3.87–5.11)
RDW: 15.1 % (ref 11.5–15.5)
WBC: 6.5 10*3/uL (ref 4.0–10.5)

## 2015-07-31 LAB — HEMOGLOBIN A1C
HEMOGLOBIN A1C: 5.7 % — AB (ref <5.7)
Mean Plasma Glucose: 117 mg/dL — ABNORMAL HIGH (ref <117)

## 2015-07-31 NOTE — Patient Instructions (Signed)

## 2015-07-31 NOTE — Progress Notes (Signed)
Patient ID: Angelica Romero, female   DOB: 10/06/1940, 74 y.o.   MRN: [id number]  Medicare  Annual  Wellness Visit And  Comprehensive Evaluation & Examination   Assessment:   Plan:   During the course of the visit the patient was educated and counseled about appropriate screening and preventive services including:    Pneumococcal vaccine   Influenza vaccine  Td vaccine  Screening electrocardiogram  Bone densitometry screening  Colorectal cancer screening  Diabetes screening  Glaucoma screening  Nutrition counseling   Advanced directives: requested  Screening recommendations, referrals: Vaccinations:  Immunization History  Administered Date(s) Administered  . DT 07/31/2015  . DTaP 06/09/2005  . Influenza-Unspecified 04/09/2014, 05/10/2015  . Pneumococcal Conjugate-13 07/01/2014  . Pneumococcal-Unspecified 06/10/1999, 06/09/2008  . Zoster 06/10/2007  Hep B vaccine not indicated  Nutrition assessed and recommended  Colonoscopy 04/2005 & is agreeable to do a ColoGard screen Recommended yearly ophthalmology/optometry visit for glaucoma screening and checkup Recommended yearly dental visit for hygiene and checkup Advanced directives - Yes  Conditions/risks identified: BMI: Discussed weight loss, diet, and increase physical activity.  Increase physical activity: AHA recommends 150 minutes of physical activity a week.  Medications reviewed PreDiabetes is not at goal, ACE/ARB therapy: Not Indicated Urinary Incontinence is not an issue: discussed non pharmacology and pharmacology options.  Fall risk: low- discussed PT, home fall assessment, medications.   Subjective:      Angelica Romero  presents for [employer] Visit and presents for a comprehensive evaluation, examination and management of multiple medical co-morbidities.  Date of last medicare wellness visit is unknown. This very nice 74 y.o. [name] presents for 3 month follow up with hx/o elevated BP,  Chol Screening, Pre-Diabetes and Vitamin D Deficiency.      Patient is monitored expectantly for elevated BP & BP has been controlled at home. Today's BP is elevated at 156/82. Patient has had no complaints of any cardiac type chest pain, palpitations, dyspnea/orthopnea/PND, dizziness, claudication, or dependent edema.     Hyperlipidemia is controlled with diet. Patient denies myalgias or other med SE's. Last Lipids were at goal with Cholesterol 135; HDL 47; LDL 77; Triglycerides 55 on 01/02/2015.     Also, the patient has history of Morbid Obesity (BMI 34+) and consequent PreDiabetes and has had no symptoms of reactive hypoglycemia, diabetic polys, paresthesias or visual blurring.  Last A1c was  5.9% on 01/02/2015.        Further, the patient also has history of Vitamin D Deficiency at 46 in May 2016 and supplements vitamin D without any suspected side-effects.  Names of Other Physician/Practitioners you currently use: 1. [facility] here for primary care 2. Dr Monna Fam, eye doctor, last visit Oct 2016 3. Dr Orene Desanctis, [place], dentist, last visit Nov 2016  Patient Care Team: [name], MD as PCP - General (Internal Medicine) Monna Fam, MD as Consulting Physician (Ophthalmology) Druscilla Brownie, MD as Consulting Physician (Dermatology)  Medication Review: Medication Sig  . naproxen sodium (ANAPROX) 220 MG tablet Take 220 mg by mouth 2 (two) times daily with a meal. PRN  . aspirin EC 81 MG tablet Take 81 mg by mouth daily. Reported on 07/31/2015  . Biotin 5000 MCG TABS Take 1 tablet by mouth daily. Reported on 07/31/2015  . Magnesium 400 MG TABS Take 1 tablet by mouth daily. Reported on 07/31/2015   Allergies  Allergen Reactions  . Codeine Nausea And Vomiting  . Hydrocodone Nausea And Vomiting  . Omeprazole  Nausea And Vomiting   Current Problems (verified) Patient Active Problem List   Diagnosis Date Noted  . Screening cholesterol level  07/31/2015  . Prediabetes 07/31/2015  . Vitamin D deficiency 07/31/2015  . Elevated BP 01/02/2015   Screening Tests Health Maintenance  Topic Date Due  . TETANUS/TDAP  11/13/1957  . INFLUENZA VACCINE  03/09/2016  . DEXA SCAN  Completed  . ZOSTAVAX  Completed  . PNA vac Low Risk Adult  Completed   Immunization History  Administered Date(s) Administered  . DT 07/31/2015  . DTaP 06/09/2005  . Influenza-Unspecified 04/09/2014, 05/10/2015  . Pneumococcal Conjugate-13 07/01/2014  . Pneumococcal-Unspecified 06/10/1999, 06/09/2008  . Zoster 06/10/2007   Preventative care: Last colonoscopy: 04/2005 (Dr Alfredia Client)   Risk Factors: Tobacco Social History  Substance Use Topics  . Smoking status: Never Smoker   . Smokeless tobacco: None  . Alcohol Use: No   She does not smoke.  Patient is not a former smoker. Are there smokers in your home (other than you)?  No Alcohol Current alcohol use: none  Caffeine Current caffeine use: coffee 2-3 cups/day /day  Exercise Current exercise: walking and yard work  Nutrition/Diet Current diet: in general, a "healthy" diet    Cardiac risk factors: advanced age (older than 81 for men, 22 for women), dyslipidemia, hypertension and obesity (BMI >= 30 kg/m2).  Depression Screen (Note: if answer to either of the following is "Yes", a more complete depression screening is indicated)   Q1: Over the past two weeks, have you felt down, depressed or hopeless? No  Q2: Over the past two weeks, have you felt little interest or pleasure in doing things? No  Have you lost interest or pleasure in daily life? No  Do you often feel hopeless? No  Do you cry easily over simple problems? No  Activities of Daily Living In your present state of health, do you have any difficulty performing the following activities?:  Driving? No Managing money?  No Feeding yourself? No Getting from bed to chair? No Climbing a flight of stairs? No Preparing food and eating?:  No Bathing or showering? No Getting dressed: No Getting to the toilet? No Using the toilet:No Moving around from place to place: No In the past year have you fallen or had a near fall?:No   Are you sexually active?  No  Do you have more than one partner?  No  Vision Difficulties: No  Hearing Difficulties: No Do you often ask people to speak up or repeat themselves? No Do you experience ringing or noises in your ears? No Do you have difficulty understanding soft or whispered voices? Sometimes.  Cognition  Do you feel that you have a problem with memory?No  Do you often misplace items? No  Do you feel safe at home?  Yes  Advanced directives Does patient have a [place]? Yes Does patient have a Living Will? Yes  ROS: Constitutional: Denies fever, chills, weight loss/gain, headaches, insomnia, fatigue, night sweats, and change in appetite. Eyes: Denies redness, blurred vision, diplopia, discharge, itchy, watery eyes.  ENT: Denies discharge, congestion, post nasal drip, epistaxis, sore throat, earache, hearing loss, dental pain, Tinnitus, Vertigo, Sinus pain, snoring.  Cardio: Denies chest pain, palpitations, irregular heartbeat, syncope, dyspnea, diaphoresis, orthopnea, PND, claudication, edema Respiratory: denies cough, dyspnea, DOE, pleurisy, hoarseness, laryngitis, wheezing.  Gastrointestinal: Denies dysphagia, heartburn, reflux, water brash, pain, cramps, nausea, vomiting, bloating, diarrhea, constipation, hematemesis, melena, hematochezia, jaundice, hemorrhoids Genitourinary: Denies dysuria, frequency, urgency, nocturia,  hesitancy, discharge, hematuria, flank pain Breast: Breast lumps, nipple discharge, bleeding.  Musculoskeletal: Denies arthralgia, myalgia, stiffness, Jt. Swelling, pain, limp, and strain/sprain. Denies falls. Skin: Denies puritis, rash, hives, warts, acne, eczema, changing in skin lesion Neuro: No weakness, tremor, incoordination, spasms,  paresthesia, pain Psychiatric: Denies confusion, memory loss, sensory loss. Denies Depression. Endocrine: Denies change in weight, skin, hair change, nocturia, and paresthesia, diabetic polys, visual blurring, hyper / hypo glycemic episodes.  Heme/Lymph: No excessive bleeding, bruising, enlarged lymph nodes  Objective:     BP 156/82 mmHg  Pulse 64  Temp(Src) 97.4 F (36.3 C)  Resp 16  Ht 5\' 2"  (1.575 m)  Wt 187 lb 9.6 oz (85.095 kg)  BMI 34.30 kg/m2  General Appearance: Well nourished, alert, WD/WN, female and in no apparent distress. Eyes: PERRLA, EOMs, conjunctiva no swelling or erythema, normal fundi and vessels. Sinuses: No frontal/maxillary tenderness ENT/Mouth: EACs patent / TMs  nl. Nares clear without erythema, swelling, mucoid exudates. Oral hygiene is good. No erythema, swelling, or exudate. Tongue normal, non-obstructing. Tonsils not swollen or erythematous. Hearing normal.  Neck: Supple, thyroid normal. No bruits, nodes or JVD. Respiratory: Respiratory effort normal.  BS equal and clear bilateral without rales, rhonci, wheezing or stridor. Cardio: Heart sounds are normal with regular rate and rhythm and no murmurs, rubs or gallops. Peripheral pulses are normal and equal bilaterally without edema. No aortic or femoral bruits. Chest: symmetric with normal excursions and percussion. Breasts: Symmetric, without lumps, nipple discharge, retractions, or fibrocystic changes.  Abdomen: Flat, soft  with nl bowel sounds. Nontender, no guarding, rebound, hernias, masses, or organomegaly.  Lymphatics: Non tender without lymphadenopathy.  Musculoskeletal: Full ROM all peripheral extremities, joint stability, 5/5 strength, and normal gait. Skin: Warm and dry without rashes, lesions, cyanosis, clubbing or  ecchymosis.  Neuro: Cranial nerves intact, reflexes equal bilaterally. Normal muscle tone, no cerebellar symptoms. Sensation intact.  Pysch: Alert and oriented X 3, normal affect,  Insight and Judgment appropriate.   Cognitive Testing  Alert? Yes  Normal Appearance?Yes  Oriented to person? Yes  Place? Yes   Time? Yes  Recall of three objects?  Yes  Can perform simple calculations? Yes  Displays appropriate judgment? Yes  Can read the correct time from a watch/clock?Yes  Medicare Attestation I have personally reviewed: The patient's medical and social history Their use of alcohol, tobacco or illicit drugs Their current medications and supplements The patient's functional ability including ADLs,fall risks, home safety risks, cognitive, and hearing and visual impairment Diet and physical activities Evidence for depression or mood disorders  The patient's weight, height, BMI, and visual acuity have been recorded in the chart.  I have made referrals, counseling, and provided education to the patient based on review of the above and I have provided the patient with a written personalized care plan for preventive services.  Over 40 minutes of exam, counseling, chart review was performed.  Leeasia Secrist DAVID, MD   08/01/2015

## 2015-08-01 ENCOUNTER — Encounter: Payer: Self-pay | Admitting: Internal Medicine

## 2015-08-01 LAB — MAGNESIUM: Magnesium: 2.1 mg/dL (ref 1.5–2.5)

## 2015-08-01 LAB — URINALYSIS, MICROSCOPIC ONLY
BACTERIA UA: NONE SEEN [HPF]
CASTS: NONE SEEN [LPF]
Crystals: NONE SEEN [HPF]
RBC / HPF: NONE SEEN RBC/HPF (ref <=2)
YEAST: NONE SEEN [HPF]

## 2015-08-01 LAB — BASIC METABOLIC PANEL WITH GFR
BUN: 15 mg/dL (ref 7–25)
CO2: 25 mmol/L (ref 20–31)
Calcium: 9.7 mg/dL (ref 8.6–10.4)
Chloride: 105 mmol/L (ref 98–110)
Creat: 0.71 mg/dL (ref 0.60–0.93)
GFR, EST NON AFRICAN AMERICAN: 83 mL/min (ref >=60)
GFR, Est African American: >89 mL/min (ref >=60)
Glucose, Bld: 86 mg/dL (ref 65–99)
Potassium: 4 mmol/L (ref 3.5–5.3)
Sodium: 137 mmol/L (ref 135–146)

## 2015-08-01 LAB — HEPATIC FUNCTION PANEL
ALT: 25 U/L (ref 6–29)
AST: 29 U/L (ref 10–35)
Albumin: 4.3 g/dL (ref 3.6–5.1)
Alkaline Phosphatase: 57 U/L (ref 33–130)
BILIRUBIN DIRECT: 0.1 mg/dL (ref <=0.2)
BILIRUBIN INDIRECT: 0.2 mg/dL (ref 0.2–1.2)
Total Bilirubin: 0.3 mg/dL (ref 0.2–1.2)
Total Protein: 6.9 g/dL (ref 6.1–8.1)

## 2015-08-01 LAB — LIPID PANEL
Cholesterol: 122 mg/dL — ABNORMAL LOW (ref 125–200)
HDL: 51 mg/dL (ref >=46)
LDL CALC: 52 mg/dL (ref <130)
Total CHOL/HDL Ratio: 2.4 Ratio (ref <=5.0)
Triglycerides: 95 mg/dL (ref <150)
VLDL: 19 mg/dL (ref <30)

## 2015-08-01 LAB — URINALYSIS, ROUTINE W REFLEX MICROSCOPIC
Bilirubin Urine: NEGATIVE
Glucose, UA: NEGATIVE
Hgb urine dipstick: NEGATIVE
Ketones, ur: NEGATIVE
Nitrite: NEGATIVE
Protein, ur: NEGATIVE
Specific Gravity, Urine: 1.013 (ref 1.001–1.035)
pH: 6.5 (ref 5.0–8.0)

## 2015-08-01 LAB — TSH: TSH: 1.187 u[IU]/mL (ref 0.350–4.500)

## 2015-08-01 LAB — INSULIN, RANDOM: INSULIN: 16.6 u[IU]/mL (ref 2.0–19.6)

## 2015-08-01 LAB — VITAMIN D 25 HYDROXY (VIT D DEFICIENCY, FRACTURES): VIT D 25 HYDROXY: 33 ng/mL (ref 30–100)

## 2015-08-20 DIAGNOSIS — Z1212 Encounter for screening for malignant neoplasm of rectum: Secondary | ICD-10-CM | POA: Diagnosis not present

## 2015-08-20 DIAGNOSIS — Z1211 Encounter for screening for malignant neoplasm of colon: Secondary | ICD-10-CM | POA: Diagnosis not present

## 2015-09-01 LAB — COLOGUARD: Cologuard: NEGATIVE

## 2015-09-03 ENCOUNTER — Telehealth: Payer: Self-pay | Admitting: *Deleted

## 2015-09-03 NOTE — Telephone Encounter (Signed)
Pt aware of negative Cologuard results.

## 2015-09-08 ENCOUNTER — Other Ambulatory Visit: Payer: Self-pay | Admitting: *Deleted

## 2015-09-08 DIAGNOSIS — Z1212 Encounter for screening for malignant neoplasm of rectum: Secondary | ICD-10-CM

## 2015-09-08 LAB — POC HEMOCCULT BLD/STL (HOME/3-CARD/SCREEN)
Card #3 Fecal Occult Blood, POC: NEGATIVE
FECAL OCCULT BLD: NEGATIVE
Fecal Occult Blood, POC: NEGATIVE

## 2015-11-01 ENCOUNTER — Encounter: Payer: Self-pay | Admitting: *Deleted

## 2015-12-11 ENCOUNTER — Other Ambulatory Visit: Payer: Self-pay

## 2015-12-11 DIAGNOSIS — Z1231 Encounter for screening mammogram for malignant neoplasm of breast: Secondary | ICD-10-CM

## 2015-12-26 ENCOUNTER — Ambulatory Visit (INDEPENDENT_AMBULATORY_CARE_PROVIDER_SITE_OTHER): Payer: Medicare Other | Admitting: Internal Medicine

## 2015-12-26 ENCOUNTER — Encounter: Payer: Self-pay | Admitting: Internal Medicine

## 2015-12-26 VITALS — BP 140/82 | HR 72 | Temp 97.7°F | Resp 16 | Ht 62.0 in | Wt 191.0 lb

## 2015-12-26 DIAGNOSIS — J0101 Acute recurrent maxillary sinusitis: Secondary | ICD-10-CM | POA: Diagnosis not present

## 2015-12-26 DIAGNOSIS — J209 Acute bronchitis, unspecified: Secondary | ICD-10-CM | POA: Diagnosis not present

## 2015-12-26 MED ORDER — PREDNISONE 20 MG PO TABS: ORAL_TABLET | ORAL | Status: DC

## 2015-12-26 MED ORDER — AZITHROMYCIN 250 MG PO TABS: ORAL_TABLET | ORAL | Status: AC

## 2015-12-26 NOTE — Progress Notes (Signed)
  Subjective:    Patient ID: Angelica Romero, female    DOB: 06/10/1941, 75 y.o.   MRN: [id number]  HPI  Thids very nice 75 yo [name] presents with 1 + week hx/o sinus soreness,  pressure & congestion as well as cough productive of a greenish sputum. Denies fevers, chills, sweats, rash or dyspnea.   Medication Sig  . aspirin EC 81 MG tablet Take 81 mg by mouth daily. Reported on 07/31/2015  . Biotin 5000 MCG TABS Take 1 tablet by mouth daily. Reported on 07/31/2015  . naproxen sodium (ANAPROX) 220 MG tablet Take 220 mg by mouth 2 (two) times daily with a meal. PRN  . Magnesium 400 MG TABS Take 1 tablet by mouth daily. Reported on 07/31/2015   No facility-administered medications prior to visit.   Allergies  Allergen Reactions  . Codeine Nausea And Vomiting  . Hydrocodone Nausea And Vomiting  . Omeprazole Nausea And Vomiting   Past Medical History  Diagnosis Date  . Elevated BP   . Vitamin D deficiency   . Prediabetes     Review of Systems  10 point systems review negative except as above.    Objective:   Physical Exam  BP 140/82 mmHg  Pulse 72  Temp(Src) 97.7 F (36.5 C)  Resp 16  Ht 5\' 2"  (1.575 m)  Wt 191 lb (86.637 kg)  BMI 34.93 kg/m2  Sl hoarse. (+) congested cough.  No respiratory stridor. Skin clear w/o rash, cyanosis.   HEENT - Eac's patent. TM's Nl. EOM's full. PERRLA. (+) tender maxillary areas. NasoOroPharynx clear. Neck - supple. Nl Thyroid. Carotids 2+ & No bruits, nodes, JVD Chest - CScfattered coarse rales &  Rhonchi and no wheezes. Cor - Nl HS. RRR w/o sig MGR. PP 1(+). No edema. MS- FROM w/o deformities. Muscle power, tone and bulk Nl. Gait Nl. Neuro -Nl w/o focal abnormalities.    Assessment & Plan:    1. Acute recurrent maxillary sinusitis   2. Acute bronchitis, unspecified organism  - azithromycin (ZITHROMAX Z-PAK) 250 MG tablet; Take 2 tablets (500 mg) on  Day 1,  followed by 1 tablet (250 mg) once daily on Days 2 through 5.  Dispense: 6  each; Refill: 0 - predniSONE (DELTASONE) 20 MG tablet; 1 tab 3 x day for 3 days, then 1 tab 2 x day for 3 days, then 1 tab 1 x day for 5 days  Dispense: 20 tablet; Refill: 0 - Recc delsym  - Discussed  med's, SE's and diet as DASH.

## 2015-12-31 ENCOUNTER — Ambulatory Visit
Admission: RE | Admit: 2015-12-31 | Discharge: 2015-12-31 | Disposition: A | Discharge status: Home / Self Care | Payer: Medicare Other | Source: Ambulatory Visit

## 2015-12-31 DIAGNOSIS — Z1231 Encounter for screening mammogram for malignant neoplasm of breast: Secondary | ICD-10-CM | POA: Diagnosis not present

## 2016-02-05 ENCOUNTER — Encounter: Payer: Self-pay | Admitting: Internal Medicine

## 2016-02-05 ENCOUNTER — Ambulatory Visit (INDEPENDENT_AMBULATORY_CARE_PROVIDER_SITE_OTHER): Payer: Medicare Other | Admitting: Internal Medicine

## 2016-02-05 VITALS — BP 122/64 | HR 68 | Temp 98.0°F | Resp 16 | Ht 62.0 in | Wt 194.0 lb

## 2016-02-05 DIAGNOSIS — Z0000 Encounter for general adult medical examination without abnormal findings: Secondary | ICD-10-CM | POA: Insufficient documentation

## 2016-02-05 DIAGNOSIS — J309 Allergic rhinitis, unspecified: Secondary | ICD-10-CM | POA: Diagnosis not present

## 2016-02-05 DIAGNOSIS — R7303 Prediabetes: Secondary | ICD-10-CM

## 2016-02-05 DIAGNOSIS — R030 Elevated blood-pressure reading, without diagnosis of hypertension: Secondary | ICD-10-CM | POA: Diagnosis not present

## 2016-02-05 DIAGNOSIS — B351 Tinea unguium: Secondary | ICD-10-CM

## 2016-02-05 DIAGNOSIS — E669 Obesity, unspecified: Secondary | ICD-10-CM

## 2016-02-05 DIAGNOSIS — IMO0001 Reserved for inherently not codable concepts without codable children: Secondary | ICD-10-CM

## 2016-02-05 DIAGNOSIS — E559 Vitamin D deficiency, unspecified: Secondary | ICD-10-CM

## 2016-02-05 MED ORDER — FLUTICASONE PROPIONATE 50 MCG/ACT NA SUSP: 2.0000 | Freq: Every day | NASAL | Status: DC

## 2016-02-05 MED ORDER — TERBINAFINE HCL 250 MG PO TABS: 250.0000 mg | ORAL_TABLET | Freq: Every day | ORAL | Status: DC

## 2016-02-05 MED ORDER — FLUTICASONE FUROATE-VILANTEROL 200-25 MCG/INH IN AEPB: 1.0000 | INHALATION_SPRAY | Freq: Every day | RESPIRATORY_TRACT | Status: DC

## 2016-02-05 NOTE — Progress Notes (Signed)
MEDICARE ANNUAL WELLNESS VISIT AND FOLLOW UP  Assessment:    1. Medicare annual wellness visit, subsequent -due next year  2. Onychomycosis -lamasil -recheck LFTs in 6 weeks at nurse visit  3. Elevated BP -well controlled today -dash diet -exercise as tolerated -monitor  4. Prediabetes -diet and exercise as tolerated  5. Vitamin D deficiency -cont Vit D supplement  6. Allergic rhinitis, unspecified allergic rhinitis type -zyrtec -flonase -breo sample -call if no improvement  7.  Obesity (BMI 35) -diet and exercise as tolerated    Over 30 minutes of exam, counseling, chart review, and critical decision making was performed  Future Appointments Date Time Provider [place]  03/18/2016 9:00 AM [facility] None  09/08/2016 9:00 AM [name], MD [facility] None    Plan:   During the course of the visit the patient was educated and counseled about appropriate screening and preventive services including:    Pneumococcal vaccine   Influenza vaccine  Td vaccine  Prevnar 13  Screening electrocardiogram  Screening mammography  Bone densitometry screening  Colorectal cancer screening  Diabetes screening  Glaucoma screening  Nutrition counseling   Advanced directives: given info/requested copies   Subjective:   Angelica Romero is a 75 y.o. female who presents for Medicare Annual Wellness Visit and 3 month follow up on hypertension, prediabetes, hyperlipidemia, vitamin D def.   Her blood pressure has been controlled at home, today their BP is BP: 122/64 mmHg She does workout. She denies chest pain, shortness of breath, dizziness.  She reports that she is walking the dogs and also is doing a lot of yard work.  She reports that when she sits down she gets tired.  She realizes that her weight is heavier than it should be.  She reports that she thinks its from her diet.     She is not on cholesterol medication and denies  myalgias. Her cholesterol is at goal. The cholesterol last visit was:   Lab Results  Component Value Date   CHOL 122* 07/31/2015   HDL 51 07/31/2015   LDLCALC 52 07/31/2015   TRIG 95 07/31/2015   CHOLHDL 2.4 07/31/2015   She reports that she has been working on diet and exercise.  She walks daily.   Lab Results  Component Value Date   HGBA1C 5.7* 07/31/2015    Last GFR Lab Results  Component Value Date   GFRNONAA 83 07/31/2015   Lab Results  Component Value Date   GFRAA >89 07/31/2015   Patient is on Vitamin D supplement. Lab Results  Component Value Date   VD25OH 33 07/31/2015     She is concerned for fungal growth in her left second toenail.  It has been getting thicker and is curling which is sometimes painful.  She does have a hammer toe on this side which she states does not bother her.  She has tried OTC topicals.    She also reports that her URI which she was seen for approximately a month ago is not improving much.  She is still coughing.  She has a lot of nasal drainage.  She does not bring anything up.  She could not tolerate prednisone well.    Medication Review Current Outpatient Prescriptions on File Prior to Visit  Medication Sig Dispense Refill  . aspirin EC 81 MG tablet Take 81 mg by mouth daily. Reported on 07/31/2015    . Biotin 5000 MCG TABS Take 1 tablet by mouth daily. Reported on 07/31/2015    .  Cholecalciferol (VITAMIN D PO) Take 5,000 Units by mouth daily.    . naproxen sodium (ANAPROX) 220 MG tablet Take 220 mg by mouth 2 (two) times daily with a meal. PRN     No current facility-administered medications on file prior to visit.    Allergies: Allergies  Allergen Reactions  . Codeine Nausea And Vomiting  . Hydrocodone Nausea And Vomiting  . Omeprazole Nausea And Vomiting    Current Problems (verified) has Elevated BP; Screening cholesterol level; Prediabetes; and Vitamin D deficiency on her problem list.  Screening Tests Immunization  History  Administered Date(s) Administered  . DT 07/31/2015  . DTaP 06/09/2005  . Influenza-Unspecified 04/09/2014, 05/10/2015  . Pneumococcal Conjugate-13 07/01/2014  . Pneumococcal-Unspecified 06/10/1999, 06/09/2008  . Zoster 06/10/2007    Preventative care: Last colonoscopy: 08/20/15 Last mammogram: 01/02/16  [id number]  Prior vaccinations: TD or Tdap: 2016  Influenza: 2016  Pneumococcal: 2009 Prevnar13: 2015 Shingles/Zostavax: 2008  Names of Other Physician/Practitioners you currently use: 1. [facility]- here for primary care 2. Herbert Deaner, eye doctor, last visit 8/17 3. Dr. Orene Desanctis, dentist, last visit 02/09/16 Patient Care Team: [name], MD as PCP - General (Internal Medicine) Monna Fam, MD as Consulting Physician (Ophthalmology) Druscilla Brownie, MD as Consulting Physician (Dermatology)  Surgical: She  has no past surgical history on file. Family Her family history is not on file. Social history  She reports that she has never smoked. She does not have any smokeless tobacco history on file. She reports that she does not drink alcohol. Her drug history is not on file.  MEDICARE WELLNESS OBJECTIVES: Physical activity: Current Exercise Habits: Home exercise routine, Type of exercise: walking (yard work), Intensity: Mild Cardiac risk factors: Cardiac Risk Factors include: advanced age (>57men, >39 women);dyslipidemia;hypertension;sedentary lifestyle Depression/mood screen:   Depression screen [facility] 2/9 02/05/2016  Decreased Interest 0  Down, Depressed, Hopeless 0  PHQ - 2 Score 0    ADLs:  In your present state of health, do you have any difficulty performing the following activities: 02/05/2016 12/26/2015  Hearing? N N  Vision? N N  Difficulty concentrating or making decisions? N N  Walking or climbing stairs? N N  Dressing or bathing? N N  Doing errands, shopping? N N  Preparing Food and eating ? N -  Using the Toilet? N  -  In the past six months, have you accidently leaked urine? N -  Do you have problems with loss of bowel control? N -  Managing your Medications? N -  Managing your Finances? N -  Housekeeping or managing your Housekeeping? N -     Cognitive Testing  Alert? Yes  Normal Appearance?Yes  Oriented to person? Yes  Place? Yes   Time? Yes  Recall of three objects?  Yes  Can perform simple calculations? Yes  Displays appropriate judgment?Yes  Can read the correct time from a watch face?Yes  EOL planning: Does patient have an advance directive?: Yes Type of Advance Directive: Healthcare Power of Attorney, Living will Does patient want to make changes to advanced directive?: No - Patient declined Copy of advanced directive(s) in chart?: Yes   Objective:   Today's Vitals   02/05/16 0935  BP: 122/64  Pulse: 68  Temp: 98 F (36.7 C)  TempSrc: Temporal  Resp: 16  Height: 5\' 2"  (1.575 m)  Weight: 194 lb (87.998 kg)   Body mass index is 35.47 kg/(m^2).  General appearance: alert, no distress, WD/WN,  female HEENT: normocephalic, sclerae anicteric,  TMs pearly, nares patent, no discharge or erythema, pharynx normal Oral cavity: MMM, no lesions Neck: supple, no lymphadenopathy, no thyromegaly, no masses Heart: RRR, normal S1, S2, no murmurs Lungs: CTA bilaterally, no wheezes, rhonchi, or rales Abdomen: +bs, soft, non tender, non distended, no masses, no hepatomegaly, no splenomegaly Musculoskeletal: nontender, no swelling, no obvious deformity Extremities: no edema, no cyanosis, no clubbing Pulses: 2+ symmetric, upper and lower extremities, normal cap refill Neurological: alert, oriented x 3, CN2-12 intact, strength normal upper extremities and lower extremities, sensation normal throughout, DTRs 2+ throughout, no cerebellar signs, gait normal Psychiatric: normal affect, behavior normal, pleasant  Breast: defer Gyn: defer Rectal: defer   Medicare Attestation I have personally  reviewed: The patient's medical and social history Their use of alcohol, tobacco or illicit drugs Their current medications and supplements The patient's functional ability including ADLs,fall risks, home safety risks, cognitive, and hearing and visual impairment Diet and physical activities Evidence for depression or mood disorders  The patient's weight, height, BMI, and visual acuity have been recorded in the chart.  I have made referrals, counseling, and provided education to the patient based on review of the above and I have provided the patient with a written personalized care plan for preventive services.     Starlyn Skeans, PA-C   02/05/2016

## 2016-03-18 ENCOUNTER — Other Ambulatory Visit: Payer: Medicare Other

## 2016-03-18 DIAGNOSIS — Z79899 Other long term (current) drug therapy: Secondary | ICD-10-CM | POA: Diagnosis not present

## 2016-03-18 LAB — HEPATIC FUNCTION PANEL
ALT: 24 U/L (ref 6–29)
AST: 28 U/L (ref 10–35)
Albumin: 4.5 g/dL (ref 3.6–5.1)
Alkaline Phosphatase: 62 U/L (ref 33–130)
BILIRUBIN DIRECT: 0.1 mg/dL (ref <=0.2)
Indirect Bilirubin: 0.3 mg/dL (ref 0.2–1.2)
TOTAL PROTEIN: 7 g/dL (ref 6.1–8.1)
Total Bilirubin: 0.4 mg/dL (ref 0.2–1.2)

## 2016-05-13 ENCOUNTER — Other Ambulatory Visit: Payer: Self-pay | Admitting: *Deleted

## 2016-05-13 MED ORDER — FLUTICASONE PROPIONATE 50 MCG/ACT NA SUSP: 2.0000 | Freq: Every day | NASAL | 3 refills | Status: DC

## 2016-06-01 DIAGNOSIS — H35033 Hypertensive retinopathy, bilateral: Secondary | ICD-10-CM | POA: Diagnosis not present

## 2016-06-01 DIAGNOSIS — H35363 Drusen (degenerative) of macula, bilateral: Secondary | ICD-10-CM | POA: Diagnosis not present

## 2016-06-01 DIAGNOSIS — H40013 Open angle with borderline findings, low risk, bilateral: Secondary | ICD-10-CM | POA: Diagnosis not present

## 2016-06-01 DIAGNOSIS — H532 Diplopia: Secondary | ICD-10-CM | POA: Diagnosis not present

## 2016-09-08 ENCOUNTER — Ambulatory Visit (INDEPENDENT_AMBULATORY_CARE_PROVIDER_SITE_OTHER): Payer: Medicare Other | Admitting: Internal Medicine

## 2016-09-08 ENCOUNTER — Encounter: Payer: Self-pay | Admitting: Internal Medicine

## 2016-09-08 VITALS — BP 136/78 | HR 52 | Temp 97.3°F | Resp 16 | Ht 62.0 in | Wt 193.8 lb

## 2016-09-08 DIAGNOSIS — E785 Hyperlipidemia, unspecified: Secondary | ICD-10-CM | POA: Insufficient documentation

## 2016-09-08 DIAGNOSIS — E559 Vitamin D deficiency, unspecified: Secondary | ICD-10-CM

## 2016-09-08 DIAGNOSIS — Z136 Encounter for screening for cardiovascular disorders: Secondary | ICD-10-CM | POA: Diagnosis not present

## 2016-09-08 DIAGNOSIS — E7800 Pure hypercholesterolemia, unspecified: Secondary | ICD-10-CM | POA: Diagnosis not present

## 2016-09-08 DIAGNOSIS — Z0000 Encounter for general adult medical examination without abnormal findings: Secondary | ICD-10-CM | POA: Diagnosis not present

## 2016-09-08 DIAGNOSIS — R030 Elevated blood-pressure reading, without diagnosis of hypertension: Secondary | ICD-10-CM

## 2016-09-08 DIAGNOSIS — E782 Mixed hyperlipidemia: Secondary | ICD-10-CM | POA: Insufficient documentation

## 2016-09-08 DIAGNOSIS — R7303 Prediabetes: Secondary | ICD-10-CM | POA: Diagnosis not present

## 2016-09-08 DIAGNOSIS — Z1212 Encounter for screening for malignant neoplasm of rectum: Secondary | ICD-10-CM

## 2016-09-08 DIAGNOSIS — Z79899 Other long term (current) drug therapy: Secondary | ICD-10-CM

## 2016-09-08 DIAGNOSIS — Z0001 Encounter for general adult medical examination with abnormal findings: Secondary | ICD-10-CM

## 2016-09-08 NOTE — Progress Notes (Signed)
[facility] [name], M.D.    Angelica Romero. [name], P.A.-C      Starlyn Skeans, P.A.-C  [facility]                [address] [place] [id number] Telephone [phone] Telefax [phone]  Annual Screening/Preventative Visit & Comprehensive Evaluation &  Examination     This very nice 76 y.o. [facility] presents for a Screening/Preventative Visit & comprehensive evaluation and management of multiple medical co-morbidities.  Patient has been followed for HTN, T2_NIDDM  Prediabetes, Hyperlipidemia and Vitamin D Deficiency.      Patient has hx/o borderline elevated BP 140/78 in May 2016 and is followed expectantly. Patient's BP has been controlled at home and patient denies any cardiac symptoms as chest pain, palpitations, shortness of breath, dizziness or ankle swelling. Today's BP is at goal - 136/78.      Patient's lipids are controlled with diet. Last lipids were at goal: Lab Results  Component Value Date   CHOL 122 (L) 07/31/2015   HDL 51 07/31/2015   LDLCALC 52 07/31/2015   TRIG 95 07/31/2015   CHOLHDL 2.4 07/31/2015      Patient has prediabetes predating since May 2016 with A1c 5.9% and patient denies reactive hypoglycemic symptoms, visual blurring, diabetic polys, or paresthesias. Last A1c was near goal: Lab Results  Component Value Date   HGBA1C 5.7 (H) 07/31/2015      Finally, patient has history of Vitamin D Deficiency and last Vitamin D was low: Lab Results  Component Value Date   VD25OH 33 07/31/2015   Current Outpatient Prescriptions on File Prior to Visit  Medication Sig  . aspirin EC 81 MG  Take 81 mg by mouth daily.  . Biotin 5000 MCG TABS Take 1 tablet by mouth daily  . VITAMIN D 5,000 Units Take daily.  Marland Kitchen FLONASE  nasal spray Place 2 sprays into both nostrils daily. - uses occas to infreq.  Marland Kitchen BREO ELLIPTA 200-25 Inhale 1 puff into the lungs daily. -uses rarely  .  ANAPROX 220 MG tablet Take 220 mg by mouth 2 (two) times daily with a meal. PRN   Allergies  Allergen Reactions  . Codeine Nausea And Vomiting  . Hydrocodone Nausea And Vomiting  . Omeprazole Nausea And Vomiting   Past Medical History:  Diagnosis Date  . Elevated BP   . Prediabetes   . Vitamin D deficiency    Health Maintenance  Topic Date Due  . TETANUS/TDAP  07/30/2025  . INFLUENZA VACCINE  Completed  . DEXA SCAN  Completed  . ZOSTAVAX  Completed  . PNA vac Low Risk Adult  Completed   Immunization History  Administered Date(s) Administered  . DT 07/31/2015  . DTaP 06/09/2005  . Influenza-Unspecified 04/09/2014, 05/10/2015, 04/22/2016  . Pneumococcal Conjugate-13 07/01/2014  . Pneumococcal-Unspecified 06/10/1999, 06/09/2008  . Zoster 06/10/2007   No past surgical history on file. No family history on file. Social History  Substance Use Topics  . Smoking status: Never Smoker  . Smokeless tobacco: Not on file  . Alcohol use No    ROS Constitutional: Denies fever, chills, weight loss/gain, headaches, insomnia,  night sweats, and change in appetite. Does c/o fatigue. Eyes: Denies redness, blurred vision, diplopia, discharge, itchy, watery eyes.  ENT: Denies discharge, congestion, post nasal drip, epistaxis, sore throat, earache, hearing loss, dental pain, Tinnitus, Vertigo, Sinus  pain, snoring.  Cardio: Denies chest pain, palpitations, irregular heartbeat, syncope, dyspnea, diaphoresis, orthopnea, PND, claudication, edema Respiratory: denies cough, dyspnea, DOE, pleurisy, hoarseness, laryngitis, wheezing.  Gastrointestinal: Denies dysphagia, heartburn, reflux, water brash, pain, cramps, nausea, vomiting, bloating, diarrhea, constipation, hematemesis, melena, hematochezia, jaundice, hemorrhoids Genitourinary: Denies dysuria, frequency, urgency, nocturia, hesitancy, discharge, hematuria, flank pain Breast: Breast lumps, nipple discharge, bleeding.  Musculoskeletal: Denies  arthralgia, myalgia, stiffness, Jt. Swelling, pain, limp, and strain/sprain. Denies falls. Skin: Denies puritis, rash, hives, warts, acne, eczema, changing in skin lesion Neuro: No weakness, tremor, incoordination, spasms, paresthesia, pain Psychiatric: Denies confusion, memory loss, sensory loss. Denies Depression. Endocrine: Denies change in weight, skin, hair change, nocturia, and paresthesia, diabetic polys, visual blurring, hyper / hypo glycemic episodes.  Heme/Lymph: No excessive bleeding, bruising, enlarged lymph nodes.  Physical Exam  BP 136/78   Pulse (!) 52   Temp 97.3 F (36.3 C)   Resp 16   Ht 5\' 2"  (1.575 m)   Wt 193 lb 12.8 oz (87.9 kg)   BMI 35.45 kg/m   General Appearance: Over nourished and in no apparent distress.  Eyes: PERRLA, EOMs, conjunctiva no swelling or erythema, normal fundi and vessels. Sinuses: No frontal/maxillary tenderness ENT/Mouth: EACs patent / TMs  nl. Nares clear without erythema, swelling, mucoid exudates. Oral hygiene is good. No erythema, swelling, or exudate. Tongue normal, non-obstructing. Tonsils not swollen or erythematous. Hearing normal.  Neck: Supple, thyroid normal. No bruits, nodes or JVD. Respiratory: Respiratory effort normal.  BS equal and clear bilateral without rales, rhonci, wheezing or stridor. Cardio: Heart sounds are normal with regular rate and rhythm and no murmurs, rubs or gallops. Peripheral pulses are normal and equal bilaterally without edema. No aortic or femoral bruits. Chest: symmetric with normal excursions and percussion. Breasts: Symmetric, without lumps, nipple discharge, retractions, or fibrocystic changes.  Abdomen:  Rotund, soft with bowel sounds active. Nontender, no guarding, rebound, hernias, masses, or organomegaly.  Lymphatics: Non tender without lymphadenopathy.  Genitourinary:  Musculoskeletal: Full ROM all peripheral extremities, joint stability, 5/5 strength, and normal gait. Skin: Warm and dry without  rashes, lesions, cyanosis, clubbing or  ecchymosis.  Neuro: Cranial nerves intact, reflexes equal bilaterally. Normal muscle tone, no cerebellar symptoms. Sensation intact.  Pysch: Alert and oriented X 3, normal affect, Insight and Judgment appropriate.   Assessment and Plan  1. Annual Preventative Screening Examination   2. Elevated BP without diagnosis of hypertension  - Microalbumin / creatinine urine ratio - EKG 12-Lead - Urinalysis, Routine w reflex microscopic - CBC with Differential/Platelet - BASIC METABOLIC PANEL WITH GFR - TSH  3. Elevated cholesterol  - EKG 12-Lead - Hepatic function panel - Lipid panel - TSH  4. Prediabetes  - EKG 12-Lead - Hemoglobin A1c - Insulin, random  5. Vitamin D deficiency  - VITAMIN D 25 Hydroxy   6. Screening for rectal cancer  - POC Hemoccult Bld/Stl   7. Screening for ischemic heart disease  - EKG 12-Lead  8. Medication management  - Urinalysis, Routine w reflex microscopic - CBC with Differential/Platelet - BASIC METABOLIC PANEL WITH GFR - Hepatic function panel - Magnesium - Hemoglobin A1c - VITAMIN D 25 Hydroxy       Continue prudent diet as discussed, weight control, BP monitoring, regular exercise, and medications. Discussed med's effects and SE's. Screening labs and tests as requested with regular follow-up as recommended. Over 40 minutes of exam, counseling, chart review and high complex critical decision making was performed.

## 2016-09-08 NOTE — Patient Instructions (Signed)

## 2016-09-09 LAB — HEMOGLOBIN A1C
Hgb A1c MFr Bld: 5.5 % (ref <5.7)
Mean Plasma Glucose: 111 mg/dL

## 2016-09-09 LAB — URINALYSIS, MICROSCOPIC ONLY
Bacteria, UA: NONE SEEN [HPF]
CASTS: NONE SEEN [LPF]
Crystals: NONE SEEN [HPF]
RBC / HPF: NONE SEEN RBC/HPF (ref <=2)
YEAST: NONE SEEN [HPF]

## 2016-09-09 LAB — BASIC METABOLIC PANEL WITH GFR
BUN: 24 mg/dL (ref 7–25)
CHLORIDE: 105 mmol/L (ref 98–110)
CO2: 26 mmol/L (ref 20–31)
Calcium: 9.5 mg/dL (ref 8.6–10.4)
Creat: 0.85 mg/dL (ref 0.60–0.93)
GFR, Est African American: 76 mL/min (ref >=60)
GFR, Est Non African American: 66 mL/min (ref >=60)
GLUCOSE: 91 mg/dL (ref 65–99)
POTASSIUM: 4.5 mmol/L (ref 3.5–5.3)
Sodium: 139 mmol/L (ref 135–146)

## 2016-09-09 LAB — CBC WITH DIFFERENTIAL/PLATELET
BASOS PCT: 0 %
Basophils Absolute: 0 cells/uL (ref 0–200)
Eosinophils Absolute: 85 cells/uL (ref 15–500)
Eosinophils Relative: 1 %
HEMATOCRIT: 47.4 % — AB (ref 35.0–45.0)
HEMOGLOBIN: 16 g/dL — AB (ref 11.7–15.5)
Lymphocytes Relative: 11 %
Lymphs Abs: 935 cells/uL (ref 850–3900)
MCH: 30.8 pg (ref 27.0–33.0)
MCHC: 33.8 g/dL (ref 32.0–36.0)
MCV: 91.2 fL (ref 80.0–100.0)
MONO ABS: 595 {cells}/uL (ref 200–950)
MPV: 9.8 fL (ref 7.5–12.5)
Monocytes Relative: 7 %
NEUTROS ABS: 6885 {cells}/uL (ref 1500–7800)
Neutrophils Relative %: 81 %
Platelets: 303 10*3/uL (ref 140–400)
RBC: 5.2 MIL/uL — ABNORMAL HIGH (ref 3.80–5.10)
RDW: 14.3 % (ref 11.0–15.0)
WBC: 8.5 10*3/uL (ref 3.8–10.8)

## 2016-09-09 LAB — LIPID PANEL
Cholesterol: 142 mg/dL (ref <200)
HDL: 47 mg/dL — AB (ref >50)
LDL CALC: 81 mg/dL (ref <100)
Total CHOL/HDL Ratio: 3 Ratio (ref <5.0)
Triglycerides: 69 mg/dL (ref <150)
VLDL: 14 mg/dL (ref <30)

## 2016-09-09 LAB — HEPATIC FUNCTION PANEL
ALBUMIN: 4.6 g/dL (ref 3.6–5.1)
ALT: 25 U/L (ref 6–29)
AST: 27 U/L (ref 10–35)
Alkaline Phosphatase: 56 U/L (ref 33–130)
Bilirubin, Direct: 0.1 mg/dL (ref <=0.2)
Indirect Bilirubin: 0.4 mg/dL (ref 0.2–1.2)
TOTAL PROTEIN: 7.3 g/dL (ref 6.1–8.1)
Total Bilirubin: 0.5 mg/dL (ref 0.2–1.2)

## 2016-09-09 LAB — URINALYSIS, ROUTINE W REFLEX MICROSCOPIC
Bilirubin Urine: NEGATIVE
GLUCOSE, UA: NEGATIVE
Hgb urine dipstick: NEGATIVE
KETONES UR: NEGATIVE
NITRITE: NEGATIVE
PH: 5.5 (ref 5.0–8.0)
Protein, ur: NEGATIVE
SPECIFIC GRAVITY, URINE: 1.023 (ref 1.001–1.035)

## 2016-09-09 LAB — INSULIN, RANDOM: INSULIN: 15.6 u[IU]/mL (ref 2.0–19.6)

## 2016-09-09 LAB — MAGNESIUM: Magnesium: 2.1 mg/dL (ref 1.5–2.5)

## 2016-09-09 LAB — MICROALBUMIN / CREATININE URINE RATIO
Creatinine, Urine: 178 mg/dL (ref 20–320)
MICROALB UR: 0.7 mg/dL
MICROALB/CREAT RATIO: 4 ug/mg{creat} (ref <30)

## 2016-09-09 LAB — VITAMIN D 25 HYDROXY (VIT D DEFICIENCY, FRACTURES): Vit D, 25-Hydroxy: 64 ng/mL (ref 30–100)

## 2016-09-09 LAB — TSH: TSH: 1.97 m[IU]/L

## 2016-10-05 ENCOUNTER — Other Ambulatory Visit: Payer: Self-pay

## 2016-10-05 DIAGNOSIS — Z1212 Encounter for screening for malignant neoplasm of rectum: Secondary | ICD-10-CM

## 2016-10-05 LAB — POC HEMOCCULT BLD/STL (HOME/3-CARD/SCREEN)
FECAL OCCULT BLD: NEGATIVE
FECAL OCCULT BLD: NEGATIVE
Fecal Occult Blood, POC: NEGATIVE

## 2016-11-18 ENCOUNTER — Other Ambulatory Visit: Payer: Self-pay | Admitting: Internal Medicine

## 2016-11-18 DIAGNOSIS — Z1231 Encounter for screening mammogram for malignant neoplasm of breast: Secondary | ICD-10-CM

## 2016-12-06 DIAGNOSIS — H02833 Dermatochalasis of right eye, unspecified eyelid: Secondary | ICD-10-CM | POA: Diagnosis not present

## 2016-12-06 DIAGNOSIS — H40013 Open angle with borderline findings, low risk, bilateral: Secondary | ICD-10-CM | POA: Diagnosis not present

## 2016-12-27 DIAGNOSIS — H02423 Myogenic ptosis of bilateral eyelids: Secondary | ICD-10-CM | POA: Diagnosis not present

## 2016-12-27 DIAGNOSIS — H02831 Dermatochalasis of right upper eyelid: Secondary | ICD-10-CM | POA: Diagnosis not present

## 2016-12-27 DIAGNOSIS — H02834 Dermatochalasis of left upper eyelid: Secondary | ICD-10-CM | POA: Diagnosis not present

## 2016-12-27 DIAGNOSIS — H0279 Other degenerative disorders of eyelid and periocular area: Secondary | ICD-10-CM | POA: Diagnosis not present

## 2016-12-27 DIAGNOSIS — H02413 Mechanical ptosis of bilateral eyelids: Secondary | ICD-10-CM | POA: Diagnosis not present

## 2016-12-31 ENCOUNTER — Ambulatory Visit
Admission: RE | Admit: 2016-12-31 | Discharge: 2016-12-31 | Disposition: A | Discharge status: Home / Self Care | Payer: Medicare Other | Source: Ambulatory Visit | Attending: Internal Medicine | Admitting: Internal Medicine

## 2016-12-31 DIAGNOSIS — Z1231 Encounter for screening mammogram for malignant neoplasm of breast: Secondary | ICD-10-CM

## 2017-02-14 DIAGNOSIS — L908 Other atrophic disorders of skin: Secondary | ICD-10-CM | POA: Diagnosis not present

## 2017-02-14 DIAGNOSIS — H0230 Blepharochalasis unspecified eye, unspecified eyelid: Secondary | ICD-10-CM | POA: Diagnosis not present

## 2017-03-07 ENCOUNTER — Ambulatory Visit: Payer: Self-pay | Admitting: Internal Medicine

## 2017-03-18 DIAGNOSIS — H0230 Blepharochalasis unspecified eye, unspecified eyelid: Secondary | ICD-10-CM | POA: Diagnosis not present

## 2017-03-18 DIAGNOSIS — L908 Other atrophic disorders of skin: Secondary | ICD-10-CM | POA: Diagnosis not present

## 2017-03-22 DIAGNOSIS — H02834 Dermatochalasis of left upper eyelid: Secondary | ICD-10-CM | POA: Diagnosis not present

## 2017-03-22 DIAGNOSIS — H02831 Dermatochalasis of right upper eyelid: Secondary | ICD-10-CM | POA: Diagnosis not present

## 2017-03-22 DIAGNOSIS — L908 Other atrophic disorders of skin: Secondary | ICD-10-CM | POA: Diagnosis not present

## 2017-03-22 DIAGNOSIS — H02403 Unspecified ptosis of bilateral eyelids: Secondary | ICD-10-CM | POA: Diagnosis not present

## 2017-04-12 NOTE — Progress Notes (Signed)
MEDICARE ANNUAL WELLNESS VISIT AND FOLLOW UP  Assessment:   Prediabetes Discussed general issues about diabetes pathophysiology and management., Educational material distributed., Suggested low cholesterol diet., Encouraged aerobic exercise., Discussed foot care., Reminded to get yearly retinal exam.  Elevated cholesterol -continue medications, check lipids, decrease fatty foods, increase activity.  -     Lipid panel  Elevated BP without diagnosis of hypertension - continue medications, DASH diet, exercise and monitor at home. Call if greater than 130/80.  -     CBC with Differential/Platelet -     BASIC METABOLIC PANEL WITH GFR -     Hepatic function panel -     TSH  Vitamin D deficiency Continue supplement  Medicare annual wellness visit, subsequent 1 year  Advanced care planning/counseling discussion Discussed with patient  Medication management  Screening, anemia, deficiency, iron -     Iron,Total/Total Iron Binding Cap -     Vitamin B12  Morbid Obesity with co morbidities - long discussion about weight loss, diet, and exercise  Over 30 minutes of exam, counseling, chart review, and critical decision making was performed  Future Appointments Date Time Provider [place]  07/14/2017 10:45 AM [name], MD [facility] None  10/05/2017 9:00 AM [name], MD [facility] None    Plan:   During the course of the visit the patient was educated and counseled about appropriate screening and preventive services including:    Pneumococcal vaccine   Influenza vaccine  Td vaccine  Prevnar 13  Screening electrocardiogram  Screening mammography  Bone densitometry screening  Colorectal cancer screening  Diabetes screening  Glaucoma screening  Nutrition counseling   Advanced directives: given info/requested copies   Subjective:   Angelica Romero is a 76 y.o. female who presents for Medicare Annual Wellness Visit and 3 month follow up  on hypertension, prediabetes, hyperlipidemia, vitamin D def.   Her blood pressure has been controlled at home, today their BP is BP: 120/78 She does workout. She denies chest pain, shortness of breath, dizziness.  She had eye surgery for ptosis July 14th but was out in the yard and now has a rash on her face last Thursday, has been putting cream on it.  She is not on breo at this time.  She has some epigastric pain, pain in her legs at night.  She is not on cholesterol medication and denies myalgias. Her cholesterol is at goal. The cholesterol last visit was:   Lab Results  Component Value Date   CHOL 142 09/08/2016   HDL 47 (L) 09/08/2016   LDLCALC 81 09/08/2016   TRIG 69 09/08/2016   CHOLHDL 3.0 09/08/2016   She reports that she has been working on diet and exercise.  She walks daily.   Lab Results  Component Value Date   HGBA1C 5.5 09/08/2016   Last GFR Lab Results  Component Value Date   GFRNONAA 66 09/08/2016   Patient is on Vitamin D supplement. Lab Results  Component Value Date   VD25OH 64 09/08/2016     BMI is Body mass index is 35.67 kg/m., she is working on diet and exercise. Wt Readings from Last 3 Encounters:  04/13/17 195 lb (88.5 kg)  09/08/16 193 lb 12.8 oz (87.9 kg)  02/05/16 194 lb (88 kg)     Medication Review Current Outpatient Prescriptions on File Prior to Visit  Medication Sig Dispense Refill  . aspirin EC 81 MG tablet Take 81 mg by mouth daily. Reported on 07/31/2015    .  Biotin 5000 MCG TABS Take 1 tablet by mouth daily. Reported on 07/31/2015    . Cholecalciferol (VITAMIN D PO) Take 5,000 Units by mouth daily.    . fluticasone (FLONASE) 50 MCG/ACT nasal spray Place 2 sprays into both nostrils daily. (Patient not taking: Reported on 04/13/2017) 16 g 3  . fluticasone furoate-vilanterol (BREO ELLIPTA) 200-25 MCG/INH AEPB Inhale 1 puff into the lungs daily. (Patient not taking: Reported on 04/13/2017) 1 each 0  . naproxen sodium (ANAPROX) 220 MG tablet  Take 220 mg by mouth 2 (two) times daily with a meal. PRN     No current facility-administered medications on file prior to visit.     Allergies: Allergies  Allergen Reactions  . Codeine Nausea And Vomiting  . Hydrocodone Nausea And Vomiting  . Omeprazole Nausea And Vomiting    Current Problems (verified) has Screening for rectal cancer; Prediabetes; Vitamin D deficiency; Medicare annual wellness visit, subsequent; Elevated cholesterol; and Elevated BP without diagnosis of hypertension on her problem list.  Screening Tests Immunization History  Administered Date(s) Administered  . DT 07/31/2015  . DTaP 06/09/2005  . Influenza-Unspecified 04/09/2014, 05/10/2015, 04/22/2016  . Pneumococcal Conjugate-13 07/01/2014  . Pneumococcal-Unspecified 06/10/1999, 06/09/2008  . Zoster 06/10/2007    Preventative care: Last colonoscopy: 08/20/15 Last mammogram: 12/2016  [id number] 06/2014 lumbar spine CXR 2007  Prior vaccinations: TD or Tdap: 2016  Influenza: 2018 today  Pneumococcal: 2009 Prevnar13: 2015 Shingles/Zostavax: 2008  Names of Other Physician/Practitioners you currently use: 1. [facility]- here for primary care 2. Herbert Deaner, eye doctor, last visit 2018 3. Dr. Orene Desanctis, dentist, last visit q 6 months Patient Care Team: [name], MD as PCP - General (Internal Medicine) Monna Fam, MD as Consulting Physician (Ophthalmology) Druscilla Brownie, MD as Consulting Physician (Dermatology)  Surgical: She  has no past surgical history on file. Family Her family history includes Breast cancer in her mother. Social history  She reports that she has never smoked. She has never used smokeless tobacco. She reports that she does not drink alcohol. Her drug history is not on file.  MEDICARE WELLNESS OBJECTIVES: Physical activity: Current Exercise Habits: The patient does not participate in regular exercise at present (care taker and yard  work) Cardiac risk factors:   Depression/mood screen:   Depression screen [facility] 2/9 04/13/2017  Decreased Interest 0  Down, Depressed, Hopeless 0  PHQ - 2 Score 0    ADLs:  In your present state of health, do you have any difficulty performing the following activities: 09/08/2016  Hearing? N  Vision? N  Difficulty concentrating or making decisions? N  Walking or climbing stairs? N  Dressing or bathing? N  Doing errands, shopping? N  Some recent data might be hidden     Cognitive Testing  Alert? Yes  Normal Appearance?Yes  Oriented to person? Yes  Place? Yes   Time? Yes  Recall of three objects?  Yes  Can perform simple calculations? Yes  Displays appropriate judgment?Yes  Can read the correct time from a watch face?Yes  EOL planning: Does Patient Have a Medical Advance Directive?: Yes Type of Advance Directive: Healthcare Power of Attorney, Living will Copy of [place] in Chart?: No - copy requested   Objective:   Today's Vitals   04/13/17 1054  BP: 120/78  Pulse: 71  Resp: 16  Temp: (!) 97.4 F (36.3 C)  SpO2: 96%  Weight: 195 lb (88.5 kg)  Height: 5\' 2"  (1.575 m)  PainSc: 0-No pain  Body mass index is 35.67 kg/m.  General appearance: alert, no distress, WD/WN,  female HEENT: normocephalic, sclerae anicteric, TMs pearly, nares patent, no discharge or erythema, pharynx normal Oral cavity: MMM, no lesions Neck: supple, no lymphadenopathy, no thyromegaly, no masses Heart: RRR, normal S1, S2, no murmurs Lungs: CTA bilaterally, no wheezes, rhonchi, or rales Abdomen: +bs, soft, non tender, non distended, no masses, no hepatomegaly, no splenomegaly Musculoskeletal: nontender, no swelling, no obvious deformity Extremities: no edema, no cyanosis, no clubbing Pulses: 2+ symmetric, upper and lower extremities, normal cap refill Neurological: alert, oriented x 3, CN2-12 intact, strength normal upper extremities and lower extremities, sensation normal  throughout, DTRs 2+ throughout, no cerebellar signs, gait normal Psychiatric: normal affect, behavior normal, pleasant  Breast: defer Gyn: defer Rectal: defer   Medicare Attestation I have personally reviewed: The patient's medical and social history Their use of alcohol, tobacco or illicit drugs Their current medications and supplements The patient's functional ability including ADLs,fall risks, home safety risks, cognitive, and hearing and visual impairment Diet and physical activities Evidence for depression or mood disorders  The patient's weight, height, BMI, and visual acuity have been recorded in the chart.  I have made referrals, counseling, and provided education to the patient based on review of the above and I have provided the patient with a written personalized care plan for preventive services.     Vicie Mutters, PA-C   04/13/2017

## 2017-04-13 ENCOUNTER — Ambulatory Visit (INDEPENDENT_AMBULATORY_CARE_PROVIDER_SITE_OTHER): Payer: Medicare Other | Admitting: Physician Assistant

## 2017-04-13 ENCOUNTER — Encounter: Payer: Self-pay | Admitting: Physician Assistant

## 2017-04-13 VITALS — BP 120/78 | HR 71 | Temp 97.4°F | Resp 16 | Ht 62.0 in | Wt 195.0 lb

## 2017-04-13 DIAGNOSIS — E7800 Pure hypercholesterolemia, unspecified: Secondary | ICD-10-CM

## 2017-04-13 DIAGNOSIS — R7303 Prediabetes: Secondary | ICD-10-CM | POA: Diagnosis not present

## 2017-04-13 DIAGNOSIS — Z7189 Other specified counseling: Secondary | ICD-10-CM | POA: Diagnosis not present

## 2017-04-13 DIAGNOSIS — R6889 Other general symptoms and signs: Secondary | ICD-10-CM

## 2017-04-13 DIAGNOSIS — E559 Vitamin D deficiency, unspecified: Secondary | ICD-10-CM | POA: Diagnosis not present

## 2017-04-13 DIAGNOSIS — R030 Elevated blood-pressure reading, without diagnosis of hypertension: Secondary | ICD-10-CM | POA: Diagnosis not present

## 2017-04-13 DIAGNOSIS — Z130 Encounter for screening for diseases of the blood and blood-forming organs and certain disorders involving the immune mechanism: Secondary | ICD-10-CM

## 2017-04-13 DIAGNOSIS — Z0001 Encounter for general adult medical examination with abnormal findings: Secondary | ICD-10-CM | POA: Diagnosis not present

## 2017-04-13 DIAGNOSIS — R0989 Other specified symptoms and signs involving the circulatory and respiratory systems: Secondary | ICD-10-CM | POA: Diagnosis not present

## 2017-04-13 DIAGNOSIS — E782 Mixed hyperlipidemia: Secondary | ICD-10-CM | POA: Diagnosis not present

## 2017-04-13 DIAGNOSIS — Z79899 Other long term (current) drug therapy: Secondary | ICD-10-CM | POA: Diagnosis not present

## 2017-04-13 DIAGNOSIS — Z6835 Body mass index (BMI) 35.0-35.9, adult: Secondary | ICD-10-CM

## 2017-04-13 DIAGNOSIS — R7309 Other abnormal glucose: Secondary | ICD-10-CM | POA: Diagnosis not present

## 2017-04-13 DIAGNOSIS — Z0000 Encounter for general adult medical examination without abnormal findings: Secondary | ICD-10-CM

## 2017-04-13 DIAGNOSIS — Z23 Encounter for immunization: Secondary | ICD-10-CM

## 2017-04-13 DIAGNOSIS — M159 Polyosteoarthritis, unspecified: Secondary | ICD-10-CM | POA: Diagnosis not present

## 2017-04-13 LAB — CBC WITH DIFFERENTIAL/PLATELET
BASOS PCT: 0.9 %
Basophils Absolute: 62 cells/uL (ref 0–200)
EOS ABS: 345 {cells}/uL (ref 15–500)
Eosinophils Relative: 5 %
HCT: 43.6 % (ref 35.0–45.0)
Hemoglobin: 14.9 g/dL (ref 11.7–15.5)
Lymphs Abs: 2325 cells/uL (ref 850–3900)
MCH: 30.4 pg (ref 27.0–33.0)
MCHC: 34.2 g/dL (ref 32.0–36.0)
MCV: 89 fL (ref 80.0–100.0)
MPV: 10.9 fL (ref 7.5–12.5)
Monocytes Relative: 11.2 %
NEUTROS PCT: 49.2 %
Neutro Abs: 3395 cells/uL (ref 1500–7800)
PLATELETS: 262 10*3/uL (ref 140–400)
RBC: 4.9 10*6/uL (ref 3.80–5.10)
RDW: 13.6 % (ref 11.0–15.0)
TOTAL LYMPHOCYTE: 33.7 %
WBC: 6.9 10*3/uL (ref 3.8–10.8)
WBCMIX: 773 {cells}/uL (ref 200–950)

## 2017-04-13 LAB — BASIC METABOLIC PANEL WITH GFR
BUN: 20 mg/dL (ref 7–25)
CHLORIDE: 107 mmol/L (ref 98–110)
CO2: 25 mmol/L (ref 20–32)
Calcium: 9.8 mg/dL (ref 8.6–10.4)
Creat: 0.78 mg/dL (ref 0.60–0.93)
GFR, EST AFRICAN AMERICAN: 84 mL/min/{1.73_m2} (ref >=60)
GFR, EST NON AFRICAN AMERICAN: 73 mL/min/{1.73_m2} (ref >=60)
Glucose, Bld: 90 mg/dL (ref 65–99)
Potassium: 4.3 mmol/L (ref 3.5–5.3)
Sodium: 141 mmol/L (ref 135–146)

## 2017-04-13 LAB — LIPID PANEL
Cholesterol: 136 mg/dL (ref <200)
HDL: 47 mg/dL — ABNORMAL LOW (ref >50)
LDL CHOLESTEROL (CALC): 67 mg/dL
Non-HDL Cholesterol (Calc): 89 mg/dL (calc) (ref <130)
TRIGLYCERIDES: 140 mg/dL (ref <150)
Total CHOL/HDL Ratio: 2.9 (calc) (ref <5.0)

## 2017-04-13 LAB — IRON, TOTAL/TOTAL IRON BINDING CAP
%SAT: 30 % (calc) (ref 11–50)
IRON: 89 ug/dL (ref 45–160)
TIBC: 301 mcg/dL (calc) (ref 250–450)

## 2017-04-13 LAB — HEPATIC FUNCTION PANEL
AG Ratio: 1.8 (calc) (ref 1.0–2.5)
ALBUMIN MSPROF: 4.5 g/dL (ref 3.6–5.1)
ALT: 24 U/L (ref 6–29)
AST: 27 U/L (ref 10–35)
Alkaline phosphatase (APISO): 58 U/L (ref 33–130)
BILIRUBIN DIRECT: 0.1 mg/dL (ref 0.0–0.2)
BILIRUBIN INDIRECT: 0.3 mg/dL (ref 0.2–1.2)
BILIRUBIN TOTAL: 0.4 mg/dL (ref 0.2–1.2)
Globulin: 2.5 g/dL (calc) (ref 1.9–3.7)
Total Protein: 7 g/dL (ref 6.1–8.1)

## 2017-04-13 LAB — VITAMIN B12: VITAMIN B 12: 565 pg/mL (ref 200–1100)

## 2017-04-13 LAB — TSH: TSH: 1.44 mIU/L (ref 0.40–4.50)

## 2017-04-13 NOTE — Patient Instructions (Addendum)
We want weight loss that will last so you should lose 1-2 pounds a week.  THAT IS IT! Please pick THREE things a month to change. Once it is a habit check off the item. Then pick another three items off the list to become habits.  If you are already doing a habit on the list GREAT!  Cross that item off! o Don't drink your calories. Ie, alcohol, soda, fruit juice, and sweet tea.  o Drink more water. Drink a glass when you feel hungry or before each meal.  o Eat breakfast - Complex carb and protein (likeDannon light and fit yogurt, oatmeal, fruit, eggs, [place] bacon). o Measure your cereal.  Eat no more than one cup a day. (ie [place]) o Eat an apple a day. o Add a vegetable a day. o Try a new vegetable a month. o Use Pam! Stop using oil or butter to cook. o Don't finish your plate or use smaller plates. o Share your dessert. o Eat sugar free Jello for dessert or frozen grapes. o Don't eat 2-3 hours before bed. o Switch to whole wheat bread, pasta, and brown rice. o Make healthier choices when you eat out. No fries! o Pick baked chicken, NOT fried. o Don't forget to SLOW DOWN when you eat. It is not going anywhere.  o Take the stairs. o Park far away in the parking lot o [employer] (or weights) for 10 minutes while watching TV. o Walk at work for 10 minutes during break. o Walk outside 1 time a week with your friend, kids, dog, or significant other. o Start a walking group at [place] the mall as much as you can tolerate.  o Keep a food diary. o Weigh yourself daily. o Walk for 15 minutes 3 days per week. o Cook at home more often and eat out less.  If life happens and you go back to old habits, it is okay.  Just start over. You can do it!   If you experience chest pain, get short of breath, or tired during the exercise, please stop immediately and inform your doctor.   Drink 80-100 oz a day of water, measure it out Eat 3 meals a day, have to do breakfast, eat protein- hard  boiled eggs, protein bar like [place] protein bar, greek yogurt like oikos triple zero, chobani 100, or light n fit greek     Bad carbs also include fruit juice, alcohol, and sweet tea. These are empty calories that do not signal to your brain that you are full.   Please remember the good carbs are still carbs which convert into sugar. So please measure them out no more than 1/2-1 cup of rice, oatmeal, pasta, and beans  Veggies are however free foods! Pile them on.   Not all fruit is created equal. Please see the list below, the fruit at the bottom is higher in sugars than the fruit at the top. Please avoid all dried fruits.

## 2017-04-14 NOTE — Progress Notes (Signed)
Pt aware of lab results & voiced understanding of those results.

## 2017-04-14 NOTE — Progress Notes (Signed)
LVM for pt to return office call for LAB results.

## 2017-06-07 DIAGNOSIS — H02833 Dermatochalasis of right eye, unspecified eyelid: Secondary | ICD-10-CM | POA: Diagnosis not present

## 2017-06-07 DIAGNOSIS — H40013 Open angle with borderline findings, low risk, bilateral: Secondary | ICD-10-CM | POA: Diagnosis not present

## 2017-06-07 DIAGNOSIS — H35363 Drusen (degenerative) of macula, bilateral: Secondary | ICD-10-CM | POA: Diagnosis not present

## 2017-06-07 DIAGNOSIS — H532 Diplopia: Secondary | ICD-10-CM | POA: Diagnosis not present

## 2017-07-13 ENCOUNTER — Encounter: Payer: Self-pay | Admitting: Internal Medicine

## 2017-07-13 NOTE — Progress Notes (Signed)
This very nice 76 y.o.  [name] presents for 3 month follow up with Hypertension, Hyperlipidemia, Pre-Diabetes and Vitamin D Deficiency.  (Daughter is Earlie Counts) Patient c/o "severe" pains in all of her joints.      Patient is followed expectantly for  labile HTN & BP has been controlled at home. Today's BP is elevated at 150/74 and rechecked at 161/73  P 56.  Patient has had no complaints of any cardiac type chest pain (May 2016), palpitations, dyspnea / orthopnea / PND, dizziness, claudication, or dependent edema.     Hyperlipidemia is controlled with diet. Last Lipids were at goal: Lab Results  Component Value Date   CHOL 136 04/13/2017   HDL 47 (L) 04/13/2017   LDLCALC 81 09/08/2016   TRIG 140 04/13/2017   CHOLHDL 2.9 04/13/2017      Also, the patient has history of PreDiabetes (A1c 5.9% /May 2016) and she has had no symptoms of reactive hypoglycemia, diabetic polys, paresthesias or visual blurring.  Last A1c was normal at goal: Lab Results  Component Value Date   HGBA1C 5.5 09/08/2016      Further, the patient also has history of Vitamin D Deficiency ("33" / Dec 2016)  and supplements vitamin D without any suspected side-effects. Last vitamin D was at goal:  Lab Results  Component Value Date   VD25OH 64 09/08/2016   Current Outpatient Medications on File Prior to Visit  Medication Sig  . aspirin EC 81 MG tablet Take 81 mg by mouth daily. Reported on 07/31/2015  . Biotin 5000 MCG TABS Take 1 tablet by mouth daily. Reported on 07/31/2015  . Cholecalciferol (VITAMIN D PO) Take 5,000 Units by mouth daily.  . fluticasone (FLONASE) 50 MCG/ACT nasal spray Place 2 sprays into both nostrils daily.  . naproxen sodium (ANAPROX) 220 MG tablet Take 220 mg by mouth 2 (two) times daily with a meal. PRN   No current facility-administered medications on file prior to visit.    Allergies  Allergen Reactions  . Codeine Nausea And Vomiting  . Hydrocodone Nausea And Vomiting  . Omeprazole  Nausea And Vomiting   PMHx:   Past Medical History:  Diagnosis Date  . Elevated BP   . Prediabetes   . Vitamin D deficiency    Immunization History  Administered Date(s) Administered  . DT 07/31/2015  . DTaP 06/09/2005  . Influenza, High Dose Seasonal PF 04/13/2017  . Influenza-Unspecified 04/09/2014, 05/10/2015, 04/22/2016  . Pneumococcal Conjugate-13 07/01/2014  . Pneumococcal-Unspecified 06/10/1999, 06/09/2008  . Zoster 06/10/2007   History reviewed. No pertinent surgical history. FHx:    Reviewed / unchanged  SHx:    Reviewed / unchanged  Systems Review:  Constitutional: Denies fever, chills, wt changes, headaches, insomnia, fatigue, night sweats, change in appetite. Eyes: Denies redness, blurred vision, diplopia, discharge, itchy, watery eyes.  ENT: Denies discharge, congestion, post nasal drip, epistaxis, sore throat, earache, hearing loss, dental pain, tinnitus, vertigo, sinus pain, snoring.  CV: Denies chest pain, palpitations, irregular heartbeat, syncope, dyspnea, diaphoresis, orthopnea, PND, claudication or edema. Respiratory: denies cough, dyspnea, DOE, pleurisy, hoarseness, laryngitis, wheezing.  Gastrointestinal: Denies dysphagia, odynophagia, heartburn, reflux, water brash, abdominal pain or cramps, nausea, vomiting, bloating, diarrhea, constipation, hematemesis, melena, hematochezia  or hemorrhoids. Genitourinary: Denies dysuria, frequency, urgency, nocturia, hesitancy, discharge, hematuria or flank pain. Musculoskeletal: Denies arthralgias, myalgias, stiffness, jt. swelling, pain, limping or strain/sprain.  Skin: Denies pruritus, rash, hives, warts, acne, eczema or change in skin lesion(s). Neuro: No weakness, tremor, incoordination,  spasms, paresthesia or pain. Psychiatric: Denies confusion, memory loss or sensory loss. Endo: Denies change in weight, skin or hair change.  Heme/Lymph: No excessive bleeding, bruising or enlarged lymph nodes.  Physical Exam  BP  (!) 150/74   Pulse 64   Temp (!) 97 F (36.1 C)   Resp 18   Ht 5\' 2"  (1.575 m)   Wt 195 lb 6.4 oz (88.6 kg)   BMI 35.74 kg/m   Appears well nourished, well groomed  and in no distress.  Eyes: PERRLA, EOMs, conjunctiva no swelling or erythema. Sinuses: No frontal/maxillary tenderness ENT/Mouth: EAC's clear, TM's nl w/o erythema, bulging. Nares clear w/o erythema, swelling, exudates. Oropharynx clear without erythema or exudates. Oral hygiene is good. Tongue normal, non obstructing. Hearing intact.  Neck: Supple. Thyroid nl. Car 2+/2+ without bruits, nodes or JVD. Chest: Respirations nl with BS clear & equal w/o rales, rhonchi, wheezing or stridor.  Cor: Heart sounds normal w/ regular rate and rhythm without sig. murmurs, gallops, clicks or rubs. Peripheral pulses normal and equal  without edema.  Abdomen: Soft & bowel sounds normal. Non-tender w/o guarding, rebound, hernias, masses or organomegaly.  Lymphatics: Unremarkable.  Musculoskeletal: Full ROM all peripheral extremities, joint stability, 5/5 strength and normal gait.  Skin: Warm, dry without exposed rashes, lesions or ecchymosis apparent.  Neuro: Cranial nerves intact, reflexes equal bilaterally. Sensory-motor testing grossly intact. Tendon reflexes grossly intact.  Pysch: Alert & oriented x 3.  Insight and judgement nl & appropriate. No ideations.  Assessment and Plan:  1. Labile hypertension  - Start Lisinopril /Hctz  10/12.5 #90 x 1 rf - 1 tab daily   - monitor blood pressure   - Continue DASH diet. Reminder to go to the ER if any CP,  SOB, nausea, dizziness, severe HA, changes vision/speech.  - CBC with Differential/Platelet - BASIC METABOLIC PANEL WITH GFR - Magnesium - TSH  2. Hyperlipidemia, mixed  - Continue diet/meds, exercise,& lifestyle modifications.  - Continue monitor periodic cholesterol/liver & renal functions   - Hepatic function panel - Lipid panel - TSH  3. Prediabetes  - Continue diet,  exercise, lifestyle modifications.  - Monitor appropriate labs.  - Hemoglobin A1c - Insulin, random  4. Vitamin D deficiency  - VITAMIN D 25 Hydroxy  - Continue supplementation.  5. Abnormal glucose  - Insulin, random  6. Medication management  - CBC with Differential/Platelet - BASIC METABOLIC PANEL WITH GFR - Hepatic function panel - Magnesium - Lipid panel - TSH - Hemoglobin A1c - Insulin, random - VITAMIN D 25 Hydroxy   7. Needs flu shot  - Flu vaccine HIGH DOSE PF  8. DJD  - Start Prednisone 10 mg daily for pain control to avioid Opioids and see in 1 mot to begin slow gradual taper.        Discussed  regular exercise, BP monitoring, weight control to achieve/maintain BMI less than 25 and discussed med and SE's. Recommended labs to assess and monitor clinical status with further disposition pending results of labs. Over 30 minutes of exam, counseling, chart review was performed.

## 2017-07-13 NOTE — Patient Instructions (Signed)

## 2017-07-14 ENCOUNTER — Ambulatory Visit (INDEPENDENT_AMBULATORY_CARE_PROVIDER_SITE_OTHER): Payer: Medicare Other | Admitting: Internal Medicine

## 2017-07-14 VITALS — BP 150/74 | HR 64 | Temp 97.0°F | Resp 18 | Ht 62.0 in | Wt 195.4 lb

## 2017-07-14 DIAGNOSIS — E559 Vitamin D deficiency, unspecified: Secondary | ICD-10-CM | POA: Diagnosis not present

## 2017-07-14 DIAGNOSIS — R7303 Prediabetes: Secondary | ICD-10-CM | POA: Diagnosis not present

## 2017-07-14 DIAGNOSIS — R0989 Other specified symptoms and signs involving the circulatory and respiratory systems: Secondary | ICD-10-CM | POA: Diagnosis not present

## 2017-07-14 DIAGNOSIS — R7309 Other abnormal glucose: Secondary | ICD-10-CM

## 2017-07-14 DIAGNOSIS — M159 Polyosteoarthritis, unspecified: Secondary | ICD-10-CM | POA: Insufficient documentation

## 2017-07-14 DIAGNOSIS — Z23 Encounter for immunization: Secondary | ICD-10-CM | POA: Diagnosis not present

## 2017-07-14 DIAGNOSIS — E782 Mixed hyperlipidemia: Secondary | ICD-10-CM | POA: Diagnosis not present

## 2017-07-14 DIAGNOSIS — Z79899 Other long term (current) drug therapy: Secondary | ICD-10-CM

## 2017-07-14 MED ORDER — LISINOPRIL-HYDROCHLOROTHIAZIDE 10-12.5 MG PO TABS: 1.0000 | ORAL_TABLET | Freq: Every day | ORAL | 1 refills | Status: DC

## 2017-07-14 MED ORDER — PREDNISONE 10 MG PO TABS: 10.0000 mg | ORAL_TABLET | Freq: Every day | ORAL | 1 refills | Status: DC

## 2017-07-15 LAB — CBC WITH DIFFERENTIAL/PLATELET
BASOS ABS: 49 {cells}/uL (ref 0–200)
Basophils Relative: 0.9 %
EOS ABS: 108 {cells}/uL (ref 15–500)
EOS PCT: 2 %
HEMATOCRIT: 44.2 % (ref 35.0–45.0)
HEMOGLOBIN: 15.1 g/dL (ref 11.7–15.5)
LYMPHS ABS: 2462 {cells}/uL (ref 850–3900)
MCH: 30.2 pg (ref 27.0–33.0)
MCHC: 34.2 g/dL (ref 32.0–36.0)
MCV: 88.4 fL (ref 80.0–100.0)
MPV: 10.1 fL (ref 7.5–12.5)
Monocytes Relative: 8.8 %
NEUTROS ABS: 2306 {cells}/uL (ref 1500–7800)
NEUTROS PCT: 42.7 %
Platelets: 285 10*3/uL (ref 140–400)
RBC: 5 10*6/uL (ref 3.80–5.10)
RDW: 13.5 % (ref 11.0–15.0)
Total Lymphocyte: 45.6 %
WBC: 5.4 10*3/uL (ref 3.8–10.8)
WBCMIX: 475 {cells}/uL (ref 200–950)

## 2017-07-15 LAB — BASIC METABOLIC PANEL WITH GFR
BUN: 15 mg/dL (ref 7–25)
CO2: 26 mmol/L (ref 20–32)
CREATININE: 0.73 mg/dL (ref 0.60–0.93)
Calcium: 9.9 mg/dL (ref 8.6–10.4)
Chloride: 105 mmol/L (ref 98–110)
GFR, EST AFRICAN AMERICAN: 91 mL/min/{1.73_m2} (ref >=60)
GFR, EST NON AFRICAN AMERICAN: 79 mL/min/{1.73_m2} (ref >=60)
Glucose, Bld: 98 mg/dL (ref 65–99)
POTASSIUM: 4.4 mmol/L (ref 3.5–5.3)
SODIUM: 140 mmol/L (ref 135–146)

## 2017-07-15 LAB — LIPID PANEL
CHOL/HDL RATIO: 2.8 (calc) (ref <5.0)
Cholesterol: 140 mg/dL (ref <200)
HDL: 50 mg/dL — AB (ref >50)
LDL Cholesterol (Calc): 76 mg/dL (calc)
NON-HDL CHOLESTEROL (CALC): 90 mg/dL (ref <130)
TRIGLYCERIDES: 61 mg/dL (ref <150)

## 2017-07-15 LAB — HEPATIC FUNCTION PANEL
AG RATIO: 1.5 (calc) (ref 1.0–2.5)
ALKALINE PHOSPHATASE (APISO): 60 U/L (ref 33–130)
ALT: 28 U/L (ref 6–29)
AST: 30 U/L (ref 10–35)
Albumin: 4.4 g/dL (ref 3.6–5.1)
BILIRUBIN DIRECT: 0.1 mg/dL (ref 0.0–0.2)
BILIRUBIN TOTAL: 0.5 mg/dL (ref 0.2–1.2)
Globulin: 3 g/dL (calc) (ref 1.9–3.7)
Indirect Bilirubin: 0.4 mg/dL (calc) (ref 0.2–1.2)
Total Protein: 7.4 g/dL (ref 6.1–8.1)

## 2017-07-15 LAB — MAGNESIUM: Magnesium: 2.2 mg/dL (ref 1.5–2.5)

## 2017-07-15 LAB — TSH: TSH: 1.74 m[IU]/L (ref 0.40–4.50)

## 2017-07-15 LAB — HEMOGLOBIN A1C
EAG (MMOL/L): 6.5 (calc)
Hgb A1c MFr Bld: 5.7 % of total Hgb — ABNORMAL HIGH (ref <5.7)
MEAN PLASMA GLUCOSE: 117 (calc)

## 2017-07-15 LAB — VITAMIN D 25 HYDROXY (VIT D DEFICIENCY, FRACTURES): Vit D, 25-Hydroxy: 55 ng/mL (ref 30–100)

## 2017-07-15 LAB — INSULIN, RANDOM: Insulin: 16.9 u[IU]/mL (ref 2.0–19.6)

## 2017-08-10 DIAGNOSIS — H0230 Blepharochalasis unspecified eye, unspecified eyelid: Secondary | ICD-10-CM | POA: Diagnosis not present

## 2017-08-10 DIAGNOSIS — H57819 Brow ptosis, unspecified: Secondary | ICD-10-CM | POA: Diagnosis not present

## 2017-08-15 ENCOUNTER — Ambulatory Visit: Payer: Self-pay | Admitting: Internal Medicine

## 2017-08-17 ENCOUNTER — Ambulatory Visit (INDEPENDENT_AMBULATORY_CARE_PROVIDER_SITE_OTHER): Payer: Medicare Other | Admitting: Internal Medicine

## 2017-08-17 ENCOUNTER — Encounter: Payer: Self-pay | Admitting: Internal Medicine

## 2017-08-17 VITALS — BP 118/70 | HR 76 | Temp 97.2°F | Resp 16 | Ht 62.0 in | Wt 197.2 lb

## 2017-08-17 DIAGNOSIS — R0989 Other specified symptoms and signs involving the circulatory and respiratory systems: Secondary | ICD-10-CM

## 2017-08-17 DIAGNOSIS — M159 Polyosteoarthritis, unspecified: Secondary | ICD-10-CM | POA: Diagnosis not present

## 2017-08-17 MED ORDER — PREDNISONE 10 MG PO TABS: 10.0000 mg | ORAL_TABLET | Freq: Every day | ORAL | 1 refills | Status: DC

## 2017-08-17 NOTE — Progress Notes (Signed)
This very nice 77 y.o. [name] presents for 1 month follow up with Hypertension, adding Lisinopril/Hctz for uncontrolled BP and also started low dose prednisone for pain control (and to avoid Opioids) of  her multiple arthralgias. She reports her multiple arthralgias have markedly improved and in fact has self tapered her Prednisone to qod.      Patient reports BP has been controlled at home. Today's BP is at goal - 118/70. Patient has had no complaints of any cardiac type chest pain, palpitations, dyspnea / orthopnea / PND, dizziness, claudication, or dependent edema.     Hyperlipidemia is controlled with diet & meds. Patient denies myalgias or other med SE's. Last Lipids were  Lab Results  Component Value Date   CHOL 140 07/14/2017   HDL 50 (L) 07/14/2017   LDLCALC 81 09/08/2016   TRIG 61 07/14/2017   CHOLHDL 2.8 07/14/2017      Also, the patient has history of PreDiabetes and has had no symptoms of reactive hypoglycemia, diabetic polys, paresthesias or visual blurring.  Last A1c was not at goal. Lab Results  Component Value Date   HGBA1C 5.7 (H) 07/14/2017      Further, the patient also has history of Vitamin D Deficiency and supplements vitamin D without any suspected side-effects. Last vitamin D was near goal:  Lab Results  Component Value Date   VD25OH 55 07/14/2017   Current Outpatient Medications on File Prior to Visit  Medication Sig  . aspirin EC 81 MG tablet Take 81 mg by mouth daily. Reported on 07/31/2015  . Biotin 5000 MCG TABS Take 1 tablet by mouth daily. Reported on 07/31/2015  . Cholecalciferol (VITAMIN D PO) Take 5,000 Units by mouth daily.  . fluticasone (FLONASE) 50 MCG/ACT nasal spray Place 2 sprays into both nostrils daily.  Marland Kitchen lisinopril-hydrochlorothiazide (ZESTORETIC) 10-12.5 MG tablet Take 1 tablet by mouth daily.  . naproxen sodium (ANAPROX) 220 MG tablet Take 220 mg by mouth 2 (two) times daily with a meal. PRN   No current facility-administered  medications on file prior to visit.    Allergies  Allergen Reactions  . Codeine Nausea And Vomiting  . Hydrocodone Nausea And Vomiting  . Omeprazole Nausea And Vomiting   PMHx:   Past Medical History:  Diagnosis Date  . Elevated BP   . Prediabetes   . Vitamin D deficiency    Immunization History  Administered Date(s) Administered  . DT 07/31/2015  . DTaP 06/09/2005  . Influenza, High Dose Seasonal PF 04/13/2017  . Influenza-Unspecified 04/09/2014, 05/10/2015, 04/22/2016  . Pneumococcal Conjugate-13 07/01/2014  . Pneumococcal-Unspecified 06/10/1999, 06/09/2008  . Zoster 06/10/2007   No past surgical history on file. FHx:    Reviewed / unchanged  SHx:    Reviewed / unchanged  Systems Review:  Constitutional: Denies fever, chills, wt changes, headaches, insomnia, fatigue, night sweats, change in appetite. Eyes: Denies redness, blurred vision, diplopia, discharge, itchy, watery eyes.  ENT: Denies discharge, congestion, post nasal drip, epistaxis, sore throat, earache, hearing loss, dental pain, tinnitus, vertigo, sinus pain, snoring.  CV: Denies chest pain, palpitations, irregular heartbeat, syncope, dyspnea, diaphoresis, orthopnea, PND, claudication or edema. Respiratory: denies cough, dyspnea, DOE, pleurisy, hoarseness, laryngitis, wheezing.  Gastrointestinal: Denies dysphagia, odynophagia, heartburn, reflux, water brash, abdominal pain or cramps, nausea, vomiting, bloating, diarrhea, constipation, hematemesis, melena, hematochezia  or hemorrhoids. Genitourinary: Denies dysuria, frequency, urgency, nocturia, hesitancy, discharge, hematuria or flank pain. Musculoskeletal: Denies arthralgias, myalgias, stiffness, jt. swelling, pain, limping or strain/sprain.  Skin: Denies  pruritus, rash, hives, warts, acne, eczema or change in skin lesion(s). Neuro: No weakness, tremor, incoordination, spasms, paresthesia or pain. Psychiatric: Denies confusion, memory loss or sensory loss. Endo:  Denies change in weight, skin or hair change.  Heme/Lymph: No excessive bleeding, bruising or enlarged lymph nodes.  Physical Exam  BP 118/70   Pulse 76   Temp (!) 97.2 F (36.2 C)   Resp 16   Ht 5\' 2"  (1.575 m)   Wt 197 lb 3.2 oz (89.4 kg)   BMI 36.07 kg/m   Appears well nourished, well groomed  and in no distress.  Eyes: PERRLA, EOMs, conjunctiva no swelling or erythema. Sinuses: No frontal/maxillary tenderness ENT/Mouth: EAC's clear, TM's nl w/o erythema, bulging. Nares clear w/o erythema, swelling, exudates. Oropharynx clear without erythema or exudates. Oral hygiene is good. Tongue normal, non obstructing. Hearing intact.  Neck: Supple. Thyroid nl. Car 2+/2+ without bruits, nodes or JVD. Chest: Respirations nl with BS clear & equal w/o rales, rhonchi, wheezing or stridor.  Cor: Heart sounds normal w/ regular rate and rhythm without sig. murmurs, gallops, clicks or rubs. Peripheral pulses normal and equal  without edema.  Abdomen: Soft & bowel sounds normal. Non-tender w/o guarding, rebound, hernias, masses or organomegaly.  Lymphatics: Unremarkable.  Musculoskeletal: Full ROM all peripheral extremities, joint stability, 5/5 strength and normal gait.  Skin: Warm, dry without exposed rashes, lesions or ecchymosis apparent.  Neuro: Cranial nerves intact, reflexes equal bilaterally. Sensory-motor testing grossly intact. Tendon reflexes grossly intact.  Pysch: Alert & oriented x 3.  Insight and judgement nl & appropriate. No ideations.  Assessment and Plan:  1. Labile hypertension  - Continue medication, monitor blood pressure at home.  - Continue DASH diet. Reminder to go to the ER if any CP,  SOB, nausea, dizziness, severe HA, changes vision/speech  2. Osteoarthritis of multiple joints         Discussed  regular exercise, BP monitoring, weight control to achieve/maintain BMI less than 25 and discussed med and SE's. Recommended labs to assess and monitor clinical status with  further disposition pending results of labs. Over 30 minutes of exam, counseling, chart review was performed.

## 2017-10-05 ENCOUNTER — Encounter: Payer: Self-pay | Admitting: Internal Medicine

## 2017-10-05 ENCOUNTER — Ambulatory Visit (INDEPENDENT_AMBULATORY_CARE_PROVIDER_SITE_OTHER): Payer: Medicare Other | Admitting: Internal Medicine

## 2017-10-05 VITALS — BP 122/84 | HR 60 | Temp 97.2°F | Resp 16 | Ht 62.0 in | Wt 198.4 lb

## 2017-10-05 DIAGNOSIS — R7309 Other abnormal glucose: Secondary | ICD-10-CM | POA: Diagnosis not present

## 2017-10-05 DIAGNOSIS — Z136 Encounter for screening for cardiovascular disorders: Secondary | ICD-10-CM

## 2017-10-05 DIAGNOSIS — Z6835 Body mass index (BMI) 35.0-35.9, adult: Secondary | ICD-10-CM

## 2017-10-05 DIAGNOSIS — Z0000 Encounter for general adult medical examination without abnormal findings: Secondary | ICD-10-CM

## 2017-10-05 DIAGNOSIS — R7303 Prediabetes: Secondary | ICD-10-CM

## 2017-10-05 DIAGNOSIS — G894 Chronic pain syndrome: Secondary | ICD-10-CM

## 2017-10-05 DIAGNOSIS — I10 Essential (primary) hypertension: Secondary | ICD-10-CM | POA: Diagnosis not present

## 2017-10-05 DIAGNOSIS — Z79899 Other long term (current) drug therapy: Secondary | ICD-10-CM

## 2017-10-05 DIAGNOSIS — E559 Vitamin D deficiency, unspecified: Secondary | ICD-10-CM

## 2017-10-05 DIAGNOSIS — E782 Mixed hyperlipidemia: Secondary | ICD-10-CM

## 2017-10-05 DIAGNOSIS — Z1211 Encounter for screening for malignant neoplasm of colon: Secondary | ICD-10-CM

## 2017-10-05 DIAGNOSIS — Z0001 Encounter for general adult medical examination with abnormal findings: Secondary | ICD-10-CM

## 2017-10-05 DIAGNOSIS — M159 Polyosteoarthritis, unspecified: Secondary | ICD-10-CM

## 2017-10-05 DIAGNOSIS — Z1212 Encounter for screening for malignant neoplasm of rectum: Secondary | ICD-10-CM

## 2017-10-05 NOTE — Progress Notes (Signed)
[name] [name], M.D.     Angelica Romero. Angelica Romero, P.A.-C Angelica Romero, [place] [address] [place] [address] Telephone [phone] Telefax [phone] Annual Screening/Preventative Visit & Comprehensive Evaluation &  Examination     This very nice 77 y.o. Angelica Romero presents for a Screening/Preventative Visit & comprehensive evaluation and management of multiple medical co-morbidities.  Patient has been followed for HTN, HLD, Prediabetes  and Vitamin D Deficiency. Patient has hx/o near disabling pains in multiple joints attributed and for which she was started on low dose alternate day steroid dosing with remarkable improvement in increased in mobility & independence.       HTN predates since 2016. Patient's BP has been controlled at home and patient denies any cardiac symptoms as chest pain, palpitations, shortness of breath, dizziness or ankle swelling. Today's BP is at goal - 122/84.      Patient's hyperlipidemia is controlled with diet and medications. Patient denies myalgias or other medication SE's. Last lipids were at goal: Lab Results  Component Value Date   CHOL 140 07/14/2017   HDL 50 (L) 07/14/2017   LDLCALC 81 09/08/2016   TRIG 61 07/14/2017   CHOLHDL 2.8 07/14/2017      Patient has prediabetes (A1c 5.9% /may 2016) and patient denies reactive hypoglycemic symptoms, visual blurring, diabetic polys, or paresthesias. Last A1c was near goal: Lab Results  Component Value Date   HGBA1C 5.7 (H) 07/14/2017      Finally, patient has history of Vitamin D Deficiency ("33"/ Dec 2016)  and last Vitamin D was near goal (70-100): Lab Results  Component Value Date   VD25OH 55 07/14/2017   Current Outpatient Medications on File Prior to Visit  Medication Sig  . aspirin EC 81 MG tablet Take 81 mg by mouth daily. Reported on 07/31/2015  . Biotin 5000 MCG TABS Take 1 tablet by mouth daily. Reported  on 07/31/2015  . Cholecalciferol (VITAMIN D PO) Take 5,000 Units by mouth daily.  . fluticasone (FLONASE) 50 MCG/ACT nasal spray Place 2 sprays into both nostrils daily.  Marland Kitchen lisinopril-hydrochlorothiazide (ZESTORETIC) 10-12.5 MG tablet Take 1 tablet by mouth daily.  . naproxen sodium (ANAPROX) 220 MG tablet Take 220 mg by mouth 2 (two) times daily with a meal. PRN  . predniSONE (DELTASONE) 10 MG tablet Take 1 tablet (10 mg total) by mouth daily with breakfast.   No current facility-administered medications on file prior to visit.    Allergies  Allergen Reactions  . Codeine Nausea And Vomiting  . Hydrocodone Nausea And Vomiting  . Omeprazole Nausea And Vomiting   Past Medical History:  Diagnosis Date  . Elevated BP   . Prediabetes   . Vitamin D deficiency    Health Maintenance  Topic Date Due  . TETANUS/TDAP  07/30/2025  . INFLUENZA VACCINE  Completed  . DEXA SCAN  Completed  . PNA vac Low Risk Adult  Completed   Immunization History  Administered Date(s) Administered  . DT 07/31/2015  . DTaP 06/09/2005  . Influenza, High Dose Seasonal PF 04/13/2017  . Influenza-Unspecified 04/09/2014, 05/10/2015, 04/22/2016  . Pneumococcal Conjugate-13 07/01/2014  . Pneumococcal-Unspecified 06/10/1999, 06/09/2008  . Zoster 06/10/2007   Last Colon - had Negative  Cologard in 2017   Last MGM - 01/04/2017 - due 12/2017  History reviewed. No pertinent surgical history.   Family History  Problem Relation Age of Onset  . Breast cancer Mother    Social History  Tobacco Use  . Smoking status: Never Smoker  . Smokeless tobacco: Never Used  Substance Use Topics  . Alcohol use: No    Alcohol/week: 0.0 oz  . Drug use: Not on file    ROS Constitutional: Denies fever, chills, weight loss/gain, headaches, insomnia,  night sweats, and change in appetite. Does c/o fatigue. Eyes: Denies redness, blurred vision, diplopia, discharge, itchy, watery eyes.  ENT: Denies discharge, congestion, post  nasal drip, epistaxis, sore throat, earache, hearing loss, dental pain, Tinnitus, Vertigo, Sinus pain, snoring.  Cardio: Denies chest pain, palpitations, irregular heartbeat, syncope, dyspnea, diaphoresis, orthopnea, PND, claudication, edema Respiratory: denies cough, dyspnea, DOE, pleurisy, hoarseness, laryngitis, wheezing.  Gastrointestinal: Denies dysphagia, heartburn, reflux, water brash, pain, cramps, nausea, vomiting, bloating, diarrhea, constipation, hematemesis, melena, hematochezia, jaundice, hemorrhoids Genitourinary: Denies dysuria, frequency, urgency, nocturia, hesitancy, discharge, hematuria, flank pain Breast: Breast lumps, nipple discharge, bleeding.  Musculoskeletal: Denies arthralgia, myalgia, stiffness, Jt. Swelling, pain, limp, and strain/sprain. Denies falls. Skin: Denies puritis, rash, hives, warts, acne, eczema, changing in skin lesion Neuro: No weakness, tremor, incoordination, spasms, paresthesia, pain Psychiatric: Denies confusion, memory loss, sensory loss. Denies Depression. Endocrine: Denies change in weight, skin, hair change, nocturia, and paresthesia, diabetic polys, visual blurring, hyper / hypo glycemic episodes.  Heme/Lymph: No excessive bleeding, bruising, enlarged lymph nodes.  Physical Exam  BP 122/84   Pulse 60   Temp (!) 97.2 F (36.2 C)   Resp 16   Ht 5\' 2"  (1.575 m)   Wt 198 lb 6.4 oz (90 kg)   BMI 36.29 kg/m   General Appearance: Well nourished, well groomed and in no apparent distress.  Eyes: PERRLA, EOMs, conjunctiva no swelling or erythema, normal fundi and vessels. Sinuses: No frontal/maxillary tenderness ENT/Mouth: EACs patent / TMs  nl. Nares clear without erythema, swelling, mucoid exudates. Oral hygiene is good. No erythema, swelling, or exudate. Tongue normal, non-obstructing. Tonsils not swollen or erythematous. Hearing normal.  Neck: Supple, thyroid not palpable. No bruits, nodes or JVD. Respiratory: Respiratory effort normal.  BS  equal and clear bilateral without rales, rhonci, wheezing or stridor. Cardio: Heart sounds are normal with regular rate and rhythm and no murmurs, rubs or gallops. Peripheral pulses are normal and equal bilaterally without edema. No aortic or femoral bruits. Chest: symmetric with normal excursions and percussion. Breasts: Symmetric, without lumps, nipple discharge, retractions, or fibrocystic changes.  Abdomen: Flat, soft with bowel sounds active. Nontender, no guarding, rebound, hernias, masses, or organomegaly.  Lymphatics: Non tender without lymphadenopathy.  Genitourinary:  Musculoskeletal: Full ROM all peripheral extremities, joint stability, 5/5 strength, and normal gait. Skin: Warm and dry without rashes, lesions, cyanosis, clubbing or  ecchymosis.  Neuro: Cranial nerves intact, reflexes equal bilaterally. Normal muscle tone, no cerebellar symptoms. Sensation intact.  Pysch: Alert and oriented X 3, normal affect, Insight and Judgment appropriate.   Assessment and Plan  1. Annual Preventative Screening Examination  2. Essential hypertension  - EKG 12-Lead - Urinalysis, Routine w reflex microscopic - Microalbumin / creatinine urine ratio - CBC with Differential/Platelet - BASIC METABOLIC PANEL WITH GFR - Magnesium - TSH  3. Hyperlipidemia, mixed  - EKG 12-Lead - Hepatic function panel - Lipid panel - TSH  4. Abnormal glucose  - Hemoglobin A1c - Insulin, random  5. Vitamin D deficiency  - VITAMIN D 25 Hydroxl  6. Prediabetes  - Hemoglobin A1c - Insulin, random  7. Osteoarthritis of multiple joints   8. Chronic pain syndrome   9. 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 Screening for colorectal cancer  - POC Hemoccult Bld/Stl   11. Screening for ischemic heart disease  - EKG 12-Lead  12. Medication management  - Urinalysis, Routine w reflex microscopic - Microalbumin / creatinine  urine ratio - CBC with Differential/Platelet - BASIC METABOLIC PANEL WITH GFR - Hepatic function panel - Magnesium - Lipid panel - TSH - Hemoglobin A1c - Insulin, random - VITAMIN D 25 Hydroxy         Patient was counseled in prudent diet to achieve/maintain BMI less than 25 for weight control, BP monitoring, regular exercise and medications. Discussed med's effects and SE's. Screening labs and tests as requested with regular follow-up as recommended. Over 40 minutes of exam, counseling, chart review and high complex critical decision making was performed.

## 2017-10-05 NOTE — Patient Instructions (Signed)

## 2017-10-06 LAB — MICROALBUMIN / CREATININE URINE RATIO
CREATININE, URINE: 30 mg/dL (ref 20–275)
MICROALB UR: 0.4 mg/dL
Microalb Creat Ratio: 13 mcg/mg creat (ref <30)

## 2017-10-06 LAB — HEPATIC FUNCTION PANEL
AG RATIO: 1.8 (calc) (ref 1.0–2.5)
ALKALINE PHOSPHATASE (APISO): 52 U/L (ref 33–130)
ALT: 29 U/L (ref 6–29)
AST: 30 U/L (ref 10–35)
Albumin: 4.8 g/dL (ref 3.6–5.1)
BILIRUBIN DIRECT: 0.1 mg/dL (ref 0.0–0.2)
BILIRUBIN TOTAL: 0.6 mg/dL (ref 0.2–1.2)
Globulin: 2.7 g/dL (calc) (ref 1.9–3.7)
Indirect Bilirubin: 0.5 mg/dL (calc) (ref 0.2–1.2)
Total Protein: 7.5 g/dL (ref 6.1–8.1)

## 2017-10-06 LAB — BASIC METABOLIC PANEL WITH GFR
BUN: 16 mg/dL (ref 7–25)
CO2: 27 mmol/L (ref 20–32)
CREATININE: 0.84 mg/dL (ref 0.60–0.93)
Calcium: 10.3 mg/dL (ref 8.6–10.4)
Chloride: 103 mmol/L (ref 98–110)
GFR, EST AFRICAN AMERICAN: 77 mL/min/{1.73_m2} (ref >=60)
GFR, EST NON AFRICAN AMERICAN: 67 mL/min/{1.73_m2} (ref >=60)
Glucose, Bld: 96 mg/dL (ref 65–99)
Potassium: 4.1 mmol/L (ref 3.5–5.3)
SODIUM: 138 mmol/L (ref 135–146)

## 2017-10-06 LAB — LIPID PANEL
CHOL/HDL RATIO: 2.8 (calc) (ref <5.0)
Cholesterol: 144 mg/dL (ref <200)
HDL: 52 mg/dL (ref >50)
LDL Cholesterol (Calc): 77 mg/dL (calc)
Non-HDL Cholesterol (Calc): 92 mg/dL (calc) (ref <130)
Triglycerides: 73 mg/dL (ref <150)

## 2017-10-06 LAB — URINALYSIS, ROUTINE W REFLEX MICROSCOPIC
BACTERIA UA: NONE SEEN /HPF
BILIRUBIN URINE: NEGATIVE
Glucose, UA: NEGATIVE
HGB URINE DIPSTICK: NEGATIVE
Hyaline Cast: NONE SEEN /LPF
KETONES UR: NEGATIVE
NITRITE: NEGATIVE
PROTEIN: NEGATIVE
RBC / HPF: NONE SEEN /HPF (ref 0–2)
SPECIFIC GRAVITY, URINE: 1.007 (ref 1.001–1.03)
pH: 5.5 (ref 5.0–8.0)

## 2017-10-06 LAB — CBC WITH DIFFERENTIAL/PLATELET
BASOS ABS: 41 {cells}/uL (ref 0–200)
Basophils Relative: 0.7 %
EOS PCT: 2.6 %
Eosinophils Absolute: 151 cells/uL (ref 15–500)
HCT: 44.3 % (ref 35.0–45.0)
Hemoglobin: 15.3 g/dL (ref 11.7–15.5)
Lymphs Abs: 2285 cells/uL (ref 850–3900)
MCH: 30.8 pg (ref 27.0–33.0)
MCHC: 34.5 g/dL (ref 32.0–36.0)
MCV: 89.3 fL (ref 80.0–100.0)
MONOS PCT: 10.6 %
MPV: 10.4 fL (ref 7.5–12.5)
NEUTROS PCT: 46.7 %
Neutro Abs: 2709 cells/uL (ref 1500–7800)
PLATELETS: 271 10*3/uL (ref 140–400)
RBC: 4.96 10*6/uL (ref 3.80–5.10)
RDW: 13.8 % (ref 11.0–15.0)
TOTAL LYMPHOCYTE: 39.4 %
WBC mixed population: 615 cells/uL (ref 200–950)
WBC: 5.8 10*3/uL (ref 3.8–10.8)

## 2017-10-06 LAB — HEMOGLOBIN A1C
EAG (MMOL/L): 6.6 (calc)
Hgb A1c MFr Bld: 5.8 % of total Hgb — ABNORMAL HIGH (ref <5.7)
Mean Plasma Glucose: 120 (calc)

## 2017-10-06 LAB — VITAMIN D 25 HYDROXY (VIT D DEFICIENCY, FRACTURES): VIT D 25 HYDROXY: 47 ng/mL (ref 30–100)

## 2017-10-06 LAB — INSULIN, RANDOM: INSULIN: 21.1 u[IU]/mL — AB (ref 2.0–19.6)

## 2017-10-06 LAB — TSH: TSH: 1.65 mIU/L (ref 0.40–4.50)

## 2017-10-06 LAB — MAGNESIUM: Magnesium: 2.3 mg/dL (ref 1.5–2.5)

## 2017-10-09 ENCOUNTER — Encounter: Payer: Self-pay | Admitting: Internal Medicine

## 2017-10-25 ENCOUNTER — Other Ambulatory Visit: Payer: Self-pay

## 2017-10-25 DIAGNOSIS — Z1212 Encounter for screening for malignant neoplasm of rectum: Secondary | ICD-10-CM | POA: Diagnosis not present

## 2017-10-25 DIAGNOSIS — Z1211 Encounter for screening for malignant neoplasm of colon: Secondary | ICD-10-CM

## 2017-10-25 LAB — POC HEMOCCULT BLD/STL (HOME/3-CARD/SCREEN)
Card #3 Fecal Occult Blood, POC: NEGATIVE
FECAL OCCULT BLD: NEGATIVE
Fecal Occult Blood, POC: NEGATIVE

## 2017-12-15 ENCOUNTER — Other Ambulatory Visit: Payer: Self-pay | Admitting: Internal Medicine

## 2017-12-15 DIAGNOSIS — Z1231 Encounter for screening mammogram for malignant neoplasm of breast: Secondary | ICD-10-CM

## 2017-12-29 ENCOUNTER — Telehealth: Payer: Self-pay | Admitting: *Deleted

## 2017-12-29 MED ORDER — TELMISARTAN 80 MG PO TABS: 80.0000 mg | ORAL_TABLET | Freq: Every day | ORAL | 1 refills | Status: DC

## 2017-12-29 NOTE — Telephone Encounter (Signed)
Patient called and reports she has developed a cough since on Lisinopril.  Per Dr Melford Aase, discontinue Lisinopril and an RX for Micardis 80 mg 1 tablet daily to her pharmacy.

## 2018-01-11 ENCOUNTER — Ambulatory Visit
Admission: RE | Admit: 2018-01-11 | Discharge: 2018-01-11 | Disposition: A | Discharge status: Home / Self Care | Payer: Medicare Other | Source: Ambulatory Visit | Attending: Internal Medicine | Admitting: Internal Medicine

## 2018-01-11 DIAGNOSIS — Z1231 Encounter for screening mammogram for malignant neoplasm of breast: Secondary | ICD-10-CM | POA: Diagnosis not present

## 2018-04-04 DIAGNOSIS — Z6835 Body mass index (BMI) 35.0-35.9, adult: Secondary | ICD-10-CM | POA: Insufficient documentation

## 2018-04-04 NOTE — Progress Notes (Deleted)
MEDICARE ANNUAL WELLNESS VISIT AND FOLLOW UP  Assessment:    Medicare annual wellness visit  Labile hypertension - continue medications, DASH diet, exercise and monitor at home. Call if greater than 130/80.  -     CBC with Differential/Platelet -     CMP/GFR -     TSH  Prediabetes Discussed general issues about diabetes pathophysiology and management., Educational material distributed., Suggested low cholesterol diet., Encouraged aerobic exercise., Discussed foot care., Reminded to get yearly retinal exam.  Elevated cholesterol -continue medications, check lipids, decrease fatty foods, increase activity.  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  Over 30 minutes of exam, counseling, chart review, and critical decision making was performed  Future Appointments  Date Time Provider [place]  04/05/2018 10:15 AM [name], NP [facility] None  10/24/2018  9:00 AM [name], MD [facility] None    Plan:   During the course of the visit the patient was educated and counseled about appropriate screening and preventive services including:    Pneumococcal vaccine   Influenza vaccine  Td vaccine  Prevnar 13  Screening electrocardiogram  Screening mammography  Bone densitometry screening  Colorectal cancer screening  Diabetes screening  Glaucoma screening  Nutrition counseling   Advanced directives: given info/requested copies   Subjective:   Angelica Romero is a 77 y.o. female who presents for Medicare Annual Wellness Visit and 3 month follow up on hypertension, prediabetes, hyperlipidemia, vitamin D def and morbid obesity. Patient has hx of disabling polyarthralgias without clear etiology but demonstrated significant improvement on low  dose alternate day steroid dosing and maintains mobility and independence.    BMI is There is no height or weight on file to calculate BMI., she {HAS HAS ZOX:[id number]} been working on diet and exercise. Wt Readings from Last 3 Encounters:  10/05/17 198 lb 6.4 oz (90 kg)  08/17/17 197 lb 3.2 oz (89.4 kg)  07/14/17 195 lb 6.4 oz (88.6 kg)   Her blood pressure has been controlled at home, today their BP is   She does workout. She denies chest pain, shortness of breath, dizziness.   She is not on cholesterol medication and denies myalgias. Her cholesterol is at goal. The cholesterol last visit was:   Lab Results  Component Value Date   CHOL 144 10/05/2017   HDL 52 10/05/2017   LDLCALC 77 10/05/2017   TRIG 73 10/05/2017   CHOLHDL 2.8 10/05/2017   She reports that she has been working on diet and exercise.  She walks daily.   Lab Results  Component Value Date   HGBA1C 5.8 (H) 10/05/2017   Last GFR Lab Results  Component Value Date   GFRNONAA 67 10/05/2017   Patient is on Vitamin D supplement. Lab Results  Component Value Date   VD25OH 47 10/05/2017      Medication Review Current Outpatient Medications on File Prior to Visit  Medication Sig Dispense Refill  . aspirin EC 81 MG tablet Take 81 mg by mouth daily. Reported on 07/31/2015    . Biotin 5000 MCG TABS Take 1 tablet by mouth daily. Reported on 07/31/2015    . Cholecalciferol (VITAMIN D PO) Take 5,000 Units by mouth daily.    . fluticasone (FLONASE) 50 MCG/ACT nasal spray Place 2 sprays into both nostrils daily. [place]  g 3  . naproxen sodium (ANAPROX) 220 MG tablet Take 220 mg by mouth 2 (two) times daily with a meal. PRN    . predniSONE (DELTASONE) 10 MG tablet Take 1 tablet (10 mg total) by mouth daily with breakfast. 90 tablet 1  . telmisartan (MICARDIS) 80 MG tablet Take 1 tablet (80 mg total) by mouth daily. 90 tablet 1   No current facility-administered medications on file prior to visit.     Allergies: Allergies   Allergen Reactions  . Codeine Nausea And Vomiting  . Hydrocodone Nausea And Vomiting  . Lisinopril Cough    cough  . Omeprazole Nausea And Vomiting    Current Problems (verified) has Labile hypertension; Prediabetes; Vitamin D deficiency; Encounter for Medicare annual wellness exam; Hyperlipidemia; DJD (degenerative joint disease), multiple sites; and Morbid obesity ([place]) on their problem list.  Screening Tests Immunization History  Administered Date(s) Administered  . DT 07/31/2015  . DTaP 06/09/2005  . Influenza, High Dose Seasonal PF 04/13/2017  . Influenza-Unspecified 04/09/2014, 05/10/2015, 04/22/2016  . Pneumococcal Conjugate-13 07/01/2014  . Pneumococcal-Unspecified 06/10/1999, 06/09/2008  . Zoster 06/10/2007    Preventative care: Last colonoscopy: 08/20/15 Last mammogram: 01/2018  DEXA: 2013 normal 06/2014 lumbar spine CXR 2007  Prior vaccinations: TD or Tdap: 2016  Influenza: 2018  Pneumococcal: 2009 Prevnar13: 2015 Shingles/Zostavax: 2008  Names of Other Physician/Practitioners you currently use: 1. [facility]- here for primary care 2. Herbert Deaner, eye doctor, last visit 2018 3. Dr. Orene Desanctis, dentist, last visit q 6 months  Patient Care Team: [name], MD as PCP - General (Internal Medicine) Monna Fam, MD as Consulting Physician (Ophthalmology) Druscilla Brownie, MD as Consulting Physician (Dermatology)  Surgical: She  has no past surgical history on file. Family Her family history includes Breast cancer in her mother. Social history  She reports that she has never smoked. She has never used smokeless tobacco. She reports that she does not drink alcohol. Her drug history is not on file.  MEDICARE WELLNESS OBJECTIVES: Physical activity:   Cardiac risk factors:   Depression/mood screen:   Depression screen [facility] 2/9 10/05/2017  Decreased Interest 0  Down, Depressed, Hopeless 0  PHQ - 2 Score 0    ADLs:   In your present state of health, do you have any difficulty performing the following activities: 10/05/2017 08/17/2017  Hearing? N N  Vision? N N  Difficulty concentrating or making decisions? N N  Walking or climbing stairs? N N  Dressing or bathing? N N  Doing errands, shopping? N N  Some recent data might be hidden     Cognitive Testing  Alert? Yes  Normal Appearance?Yes  Oriented to person? Yes  Place? Yes   Time? Yes  Recall of three objects?  Yes  Can perform simple calculations? Yes  Displays appropriate judgment?Yes  Can read the correct time from a watch face?Yes  EOL planning:     Objective:   There were no vitals filed for this visit. There is no height or weight on file to calculate BMI.  General appearance: alert, no distress, WD/WN,  female HEENT: normocephalic, sclerae anicteric, TMs pearly, nares patent, no discharge or erythema, pharynx normal Oral cavity: MMM, no lesions Neck: supple, no lymphadenopathy, no thyromegaly, no masses Heart: RRR, normal S1, S2, no murmurs Lungs: CTA bilaterally, no wheezes, rhonchi, or rales Abdomen: +bs, soft, non tender, non distended, no masses, no hepatomegaly, no splenomegaly Musculoskeletal: nontender, no swelling, no obvious deformity Extremities: no edema, no cyanosis,  no clubbing Pulses: 2+ symmetric, upper and lower extremities, normal cap refill Neurological: alert, oriented x 3, CN2-12 intact, strength normal upper extremities and lower extremities, sensation normal throughout, DTRs 2+ throughout, no cerebellar signs, gait normal Psychiatric: normal affect, behavior normal, pleasant  Breast: defer Gyn: defer Rectal: defer   Medicare Attestation I have personally reviewed: The patient's medical and social history Their use of alcohol, tobacco or illicit drugs Their current medications and supplements The patient's functional ability including ADLs,fall risks, home safety risks, cognitive, and hearing and visual  impairment Diet and physical activities Evidence for depression or mood disorders  The patient's weight, height, BMI, and visual acuity have been recorded in the chart.  I have made referrals, counseling, and provided education to the patient based on review of the above and I have provided the patient with a written personalized care plan for preventive services.     Izora Ribas, NP   04/04/2018

## 2018-04-05 ENCOUNTER — Ambulatory Visit: Payer: Self-pay | Admitting: Adult Health

## 2018-04-17 NOTE — Progress Notes (Signed)
MEDICARE ANNUAL WELLNESS VISIT AND FOLLOW UP  Assessment:    Medicare annual wellness visit  Labile hypertension - continue medications, DASH diet, exercise and monitor at home. Call if greater than 130/80.  -     CBC with Differential/Platelet -     CMP/GFR -     TSH  Prediabetes Discussed general issues about diabetes pathophysiology and management., Educational material distributed., Suggested low cholesterol diet., Encouraged aerobic exercise., Discussed foot care., Reminded to get yearly retinal exam.  Elevated cholesterol -continue medications, check lipids, decrease fatty foods, increase activity.  -     Lipid panel  Vitamin D deficiency Continue supplementation Check vitamin D level   Morbid Obesity with co morbidities - long discussion about weight loss, diet, and exercise  DJD, multiple sites Controlled, uses prednisone PRN for severe pain, hasn't needed recently  Over 30 minutes of exam, counseling, chart review, and critical decision making was performed  Future Appointments  Date Time Provider [place]  10/24/2018  9:00 AM [name], MD [facility] None    Plan:   During the course of the visit the patient was educated and counseled about appropriate screening and preventive services including:    Pneumococcal vaccine   Influenza vaccine  Td vaccine  Prevnar 13  Screening electrocardiogram  Screening mammography  Bone densitometry screening  Colorectal cancer screening  Diabetes screening  Glaucoma screening  Nutrition counseling   Advanced directives: given info/requested copies   Subjective:   Angelica Romero is a 77 y.o. female who presents for Medicare Annual Wellness Visit and 3 month follow up on hypertension, prediabetes, hyperlipidemia, vitamin D def and morbid obesity. Patient has hx of disabling polyarthralgias without clear etiology but doing well with PRN prednisone use, not needing frequently recently, and  maintains mobility and independence.   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  Wt Readings from Last 3 Encounters:  04/18/18 193 lb 6.4 oz (87.7 kg)  10/05/17 198 lb 6.4 oz (90 kg)  08/17/17 197 lb 3.2 oz (89.4 kg)   Her blood pressure has been controlled at home, today their BP is BP: 114/64 She does workout. She denies chest pain, shortness of breath, dizziness.   She is not on cholesterol medication and denies myalgias. Her cholesterol is at goal. The cholesterol last visit was:   Lab Results  Component Value Date   CHOL 144 10/05/2017   HDL 52 10/05/2017   LDLCALC 77 10/05/2017   TRIG 73 10/05/2017   CHOLHDL 2.8 10/05/2017   She reports that she has been working on diet and exercise.  She walks daily.   Lab Results  Component Value Date   HGBA1C 5.8 (H) 10/05/2017   Last GFR Lab Results  Component Value Date   GFRNONAA 67 10/05/2017   Patient is on Vitamin D supplement. Lab Results  Component Value Date   VD25OH 47 10/05/2017       Medication Review Current Outpatient Medications on File Prior to Visit  Medication Sig Dispense Refill  . aspirin EC 81 MG tablet Take 81 mg by mouth daily. Reported on 07/31/2015    . Biotin 5000 MCG TABS Take 1 tablet by mouth daily. Reported on 07/31/2015    . Cholecalciferol (VITAMIN D PO) Take 5,000 Units by mouth daily.    . fluticasone (FLONASE) 50 MCG/ACT nasal  spray Place 2 sprays into both nostrils daily. 16 g 3  . naproxen sodium (ANAPROX) 220 MG tablet Take 220 mg by mouth 2 (two) times daily with a meal. PRN    . telmisartan (MICARDIS) 80 MG tablet Take 1 tablet (80 mg total) by mouth daily. 90 tablet 1  . predniSONE (DELTASONE) 10 MG tablet Take 1 tablet (10 mg total) by mouth daily with breakfast. (Patient not taking: Reported on 04/18/2018) 90  tablet 1   No current facility-administered medications on file prior to visit.     Allergies: Allergies  Allergen Reactions  . Codeine Nausea And Vomiting  . Hydrocodone Nausea And Vomiting  . Lisinopril Cough    cough  . Omeprazole Nausea And Vomiting    Current Problems (verified) has Labile hypertension; Prediabetes; Vitamin D deficiency; Encounter for Medicare annual wellness exam; Hyperlipidemia; DJD (degenerative joint disease), multiple sites; and Morbid obesity ([place]) on their problem list.  Screening Tests Immunization History  Administered Date(s) Administered  . DT 07/31/2015  . DTaP 06/09/2005  . Influenza, High Dose Seasonal PF 04/13/2017  . Influenza-Unspecified 04/09/2014, 05/10/2015, 04/22/2016  . Pneumococcal Conjugate-13 07/01/2014  . Pneumococcal-Unspecified 06/10/1999, 06/09/2008  . Zoster 06/10/2007    Preventative care: Last colonoscopy: cologuard - 08/20/15 Last mammogram: 01/2018  DEXA: 2013 normal- will get next year 06/2014 lumbar spine CXR 2007  Prior vaccinations: TD or Tdap: 2016  Influenza: 2018  Pneumococcal: 2009 Prevnar13: 2015 Shingles/Zostavax: 2008  Names of Other Physician/Practitioners you currently use: 1. [facility]- here for primary care 2. Herbert Deaner, eye doctor, last visit 2019 3. Dr. Orene Desanctis , dentist, last visit 2019,  q 6 months  Patient Care Team: [name], MD as PCP - General (Internal Medicine) Monna Fam, MD as Consulting Physician (Ophthalmology) Druscilla Brownie, MD as Consulting Physician (Dermatology)  Surgical: She  has no past surgical history on file. Family Her family history includes Breast cancer in her mother. Social history  She reports that she has never smoked. She has never used smokeless tobacco. She reports that she does not drink alcohol. Her drug history is not on file.  MEDICARE WELLNESS OBJECTIVES: Physical activity: Current Exercise Habits:  The patient does not participate in regular exercise at present, Exercise limited by: None identified Cardiac risk factors: Cardiac Risk Factors include: advanced age (>37men, >86 women);hypertension;obesity (BMI >30kg/m2);sedentary lifestyle Depression/mood screen:   Depression screen [facility] 2/9 04/18/2018  Decreased Interest 0  Down, Depressed, Hopeless 0  PHQ - 2 Score 0    ADLs:  In your present state of health, do you have any difficulty performing the following activities: 04/18/2018 10/05/2017  Hearing? N N  Vision? N N  Difficulty concentrating or making decisions? N N  Walking or climbing stairs? N N  Dressing or bathing? N N  Doing errands, shopping? N N  Some recent data might be hidden     Cognitive Testing  Alert? Yes  Normal Appearance?Yes  Oriented to person? Yes  Place? Yes   Time? Yes  Recall of three objects?  Yes  Can perform simple calculations? Yes  Displays appropriate judgment?Yes  Can read the correct time from a watch face?Yes  EOL planning: Does Patient Have a Medical Advance Directive?: Yes Type of Advance Directive: Healthcare Power of Attorney, Living will Does patient want to make changes to medical advance directive?: No - Patient declined Copy of [place] in Chart?: No - copy requested Would patient like information on creating a medical  advance directive?: No - Patient declined   Objective:   Today's Vitals   04/18/18 0955  BP: 114/64  Pulse: 65  Temp: (!) 96.3 F (35.7 C)  SpO2: 97%  Weight: 193 lb 6.4 oz (87.7 kg)  Height: 5\' 2"  (1.575 m)  PainSc: 8   PainLoc: Shoulder   Body mass index is 35.37 kg/m.  General appearance: alert, no distress, WD/WN,  female HEENT: normocephalic, sclerae anicteric, TMs pearly, nares patent, no discharge or erythema, pharynx normal Oral cavity: MMM, no lesions Neck: supple, no lymphadenopathy, no thyromegaly, no masses Heart: RRR, normal S1, S2, no murmurs Lungs: CTA bilaterally, no  wheezes, rhonchi, or rales Abdomen: +bs, soft, non tender, non distended, no masses, no hepatomegaly, no splenomegaly Musculoskeletal: nontender, no swelling, no obvious deformity Extremities: no edema, no cyanosis, no clubbing Pulses: 2+ symmetric, upper and lower extremities, normal cap refill Neurological: alert, oriented x 3, CN2-12 intact, strength normal upper extremities and lower extremities, sensation normal throughout, DTRs 2+ throughout, no cerebellar signs, gait normal Psychiatric: normal affect, behavior normal, pleasant  Breast: defer Gyn: defer Rectal: defer   Medicare Attestation I have personally reviewed: The patient's medical and social history Their use of alcohol, tobacco or illicit drugs Their current medications and supplements The patient's functional ability including ADLs,fall risks, home safety risks, cognitive, and hearing and visual impairment Diet and physical activities Evidence for depression or mood disorders  The patient's weight, height, BMI, and visual acuity have been recorded in the chart.  I have made referrals, counseling, and provided education to the patient based on review of the above and I have provided the patient with a written personalized care plan for preventive services.     Izora Ribas, NP   04/18/2018

## 2018-04-18 ENCOUNTER — Ambulatory Visit (INDEPENDENT_AMBULATORY_CARE_PROVIDER_SITE_OTHER): Payer: Medicare Other | Admitting: Adult Health

## 2018-04-18 ENCOUNTER — Encounter: Payer: Self-pay | Admitting: Adult Health

## 2018-04-18 VITALS — BP 114/64 | HR 65 | Temp 96.3°F | Ht 62.0 in | Wt 193.4 lb

## 2018-04-18 DIAGNOSIS — E782 Mixed hyperlipidemia: Secondary | ICD-10-CM | POA: Diagnosis not present

## 2018-04-18 DIAGNOSIS — Z79899 Other long term (current) drug therapy: Secondary | ICD-10-CM

## 2018-04-18 DIAGNOSIS — M159 Polyosteoarthritis, unspecified: Secondary | ICD-10-CM

## 2018-04-18 DIAGNOSIS — R7303 Prediabetes: Secondary | ICD-10-CM | POA: Diagnosis not present

## 2018-04-18 DIAGNOSIS — Z0001 Encounter for general adult medical examination with abnormal findings: Secondary | ICD-10-CM

## 2018-04-18 DIAGNOSIS — E559 Vitamin D deficiency, unspecified: Secondary | ICD-10-CM | POA: Diagnosis not present

## 2018-04-18 DIAGNOSIS — R6889 Other general symptoms and signs: Secondary | ICD-10-CM | POA: Diagnosis not present

## 2018-04-18 DIAGNOSIS — R0989 Other specified symptoms and signs involving the circulatory and respiratory systems: Secondary | ICD-10-CM

## 2018-04-18 DIAGNOSIS — Z0000 Encounter for general adult medical examination without abnormal findings: Secondary | ICD-10-CM

## 2018-04-18 NOTE — Patient Instructions (Addendum)
  Ms. Ramonita Lab , Thank you for taking time to come for your Medicare Wellness Visit. I appreciate your ongoing commitment to your health goals. Please review the following plan we discussed and let me know if I can assist you in the future.   These are the goals we discussed: Goals    . Blood Pressure < 130/80    . Weight (lb) < 185 lb (83.9 kg)       This is a list of the screening recommended for you and due dates:  Health Maintenance  Topic Date Due  . Flu Shot  05/01/2018*  . Tetanus Vaccine  07/30/2025  . DEXA scan (bone density measurement)  Completed  . Pneumonia vaccines  Completed  *Topic was postponed. The date shown is not the original due date.    Know what a healthy weight is for you (roughly BMI <25) and aim to maintain this  Aim for 7+ servings of fruits and vegetables daily  65-80+ fluid ounces of water or unsweet tea for healthy kidneys  Limit to max 1 drink of alcohol per day; avoid smoking/tobacco  Limit animal fats in diet for cholesterol and heart health - choose grass fed whenever available  Avoid highly processed foods, and foods high in saturated/trans fats  Aim for low stress - take time to unwind and care for your mental health  Aim for 150 min of moderate intensity exercise weekly for heart health, and weights twice weekly for bone health  Aim for 7-9 hours of sleep daily      When it comes to diets, agreement about the perfect plan isn't easy to find, even among the experts. Experts at the [place] developed an idea known as the Healthy Eating Plate. Just imagine a plate divided into logical, healthy portions.  The emphasis is on diet quality:  Load up on vegetables and fruits - one-half of your plate: Aim for color and variety, and remember that potatoes don't count.  Go for whole grains - one-quarter of your plate: Whole wheat, barley, wheat berries, quinoa, oats, brown rice, and foods made with them. If you want  pasta, go with whole wheat pasta.  Protein power - one-quarter of your plate: Fish, chicken, beans, and nuts are all healthy, versatile protein sources. Limit red meat.  The diet, however, does go beyond the plate, offering a few other suggestions.  Use healthy plant oils, such as olive, canola, soy, corn, sunflower and peanut. Check the labels, and avoid partially hydrogenated oil, which have unhealthy trans fats.  If you're thirsty, drink water. Coffee and tea are good in moderation, but skip sugary drinks and limit milk and dairy products to one or two daily servings.  The type of carbohydrate in the diet is more important than the amount. Some sources of carbohydrates, such as vegetables, fruits, whole grains, and beans-are healthier than others.  Finally, stay active.

## 2018-04-19 ENCOUNTER — Other Ambulatory Visit: Payer: Self-pay | Admitting: Adult Health

## 2018-04-19 LAB — CBC WITH DIFFERENTIAL/PLATELET
Basophils Absolute: 37 cells/uL (ref 0–200)
Basophils Relative: 0.5 %
EOS ABS: 73 {cells}/uL (ref 15–500)
Eosinophils Relative: 1 %
HCT: 43.2 % (ref 35.0–45.0)
Hemoglobin: 14.6 g/dL (ref 11.7–15.5)
Lymphs Abs: 2029 cells/uL (ref 850–3900)
MCH: 30.6 pg (ref 27.0–33.0)
MCHC: 33.8 g/dL (ref 32.0–36.0)
MCV: 90.6 fL (ref 80.0–100.0)
MPV: 10.7 fL (ref 7.5–12.5)
Monocytes Relative: 11 %
NEUTROS PCT: 59.7 %
Neutro Abs: 4358 cells/uL (ref 1500–7800)
Platelets: 267 10*3/uL (ref 140–400)
RBC: 4.77 10*6/uL (ref 3.80–5.10)
RDW: 13.4 % (ref 11.0–15.0)
TOTAL LYMPHOCYTE: 27.8 %
WBC mixed population: 803 cells/uL (ref 200–950)
WBC: 7.3 10*3/uL (ref 3.8–10.8)

## 2018-04-19 LAB — COMPLETE METABOLIC PANEL WITH GFR
AG RATIO: 1.7 (calc) (ref 1.0–2.5)
ALBUMIN MSPROF: 4.5 g/dL (ref 3.6–5.1)
ALT: 23 U/L (ref 6–29)
AST: 26 U/L (ref 10–35)
Alkaline phosphatase (APISO): 59 U/L (ref 33–130)
BUN: 14 mg/dL (ref 7–25)
CHLORIDE: 103 mmol/L (ref 98–110)
CO2: 28 mmol/L (ref 20–32)
Calcium: 10.3 mg/dL (ref 8.6–10.4)
Creat: 0.86 mg/dL (ref 0.60–0.93)
GFR, EST AFRICAN AMERICAN: 74 mL/min/{1.73_m2} (ref >=60)
GFR, Est Non African American: 64 mL/min/{1.73_m2} (ref >=60)
GLOBULIN: 2.7 g/dL (ref 1.9–3.7)
Glucose, Bld: 100 mg/dL — ABNORMAL HIGH (ref 65–99)
POTASSIUM: 4.3 mmol/L (ref 3.5–5.3)
Sodium: 138 mmol/L (ref 135–146)
Total Bilirubin: 0.6 mg/dL (ref 0.2–1.2)
Total Protein: 7.2 g/dL (ref 6.1–8.1)

## 2018-04-19 LAB — LIPID PANEL
Cholesterol: 141 mg/dL (ref <200)
HDL: 54 mg/dL (ref >50)
LDL Cholesterol (Calc): 72 mg/dL (calc)
NON-HDL CHOLESTEROL (CALC): 87 mg/dL (ref <130)
TRIGLYCERIDES: 69 mg/dL (ref <150)
Total CHOL/HDL Ratio: 2.6 (calc) (ref <5.0)

## 2018-04-19 LAB — TSH: TSH: 1.47 m[IU]/L (ref 0.40–4.50)

## 2018-04-19 LAB — VITAMIN D 25 HYDROXY (VIT D DEFICIENCY, FRACTURES): Vit D, 25-Hydroxy: 40 ng/mL (ref 30–100)

## 2018-04-19 LAB — HEMOGLOBIN A1C
Hgb A1c MFr Bld: 5.7 % of total Hgb — ABNORMAL HIGH (ref <5.7)
MEAN PLASMA GLUCOSE: 117 (calc)
eAG (mmol/L): 6.5 (calc)

## 2018-06-16 DIAGNOSIS — H26491 Other secondary cataract, right eye: Secondary | ICD-10-CM | POA: Diagnosis not present

## 2018-06-16 DIAGNOSIS — H35362 Drusen (degenerative) of macula, left eye: Secondary | ICD-10-CM | POA: Diagnosis not present

## 2018-06-16 DIAGNOSIS — H35033 Hypertensive retinopathy, bilateral: Secondary | ICD-10-CM | POA: Diagnosis not present

## 2018-06-16 DIAGNOSIS — H353111 Nonexudative age-related macular degeneration, right eye, early dry stage: Secondary | ICD-10-CM | POA: Diagnosis not present

## 2018-06-16 DIAGNOSIS — H40013 Open angle with borderline findings, low risk, bilateral: Secondary | ICD-10-CM | POA: Diagnosis not present

## 2018-06-16 LAB — HM DIABETES EYE EXAM

## 2018-07-02 ENCOUNTER — Other Ambulatory Visit: Payer: Self-pay | Admitting: Internal Medicine

## 2018-07-17 ENCOUNTER — Encounter: Payer: Self-pay | Admitting: *Deleted

## 2018-10-23 ENCOUNTER — Encounter: Payer: Self-pay | Admitting: Internal Medicine

## 2018-10-23 NOTE — Patient Instructions (Signed)

## 2018-10-23 NOTE — Progress Notes (Signed)
[name] [name], M.D.     Angelica Romero. [name], P.A.-C [name], [place] [address] [place], [place] [address] Telephone [phone] Telefax [phone] Annual Screening/Preventative Visit & Comprehensive Evaluation &  Examination     This very nice 78 y.o. [name] presents for a Screening /Preventative Visit & comprehensive evaluation and management of multiple medical co-morbidities.  Patient has been followed for HTN, HLD, Prediabetes  and Vitamin D Deficiency. 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      HTN predates circa 2016.  Patient's BP has been controlled over the last year  and patient denies any cardiac symptoms as chest pain, palpitations, shortness of breath, dizziness or ankle swelling. Today's BP: 124/84      Patient's hyperlipidemia is controlled with diet and medications. Patient denies myalgias or other medication SE's. 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 Last A1c was near goal: Lab Results  Component Value Date   HGBA1C 5.7 (H) 04/18/2018      Finally, patient has history of Vitamin D Deficiency ("33"/ 2016)  and last Vitamin D was still low :  Lab Results  Component Value Date   VD25OH 40 04/18/2018   Current Outpatient Medications on File Prior to Visit  Medication Sig  . aspirin EC 81 MG tablet Take 81 mg by mouth daily. Reported on 07/31/2015  . Biotin 5000 MCG TABS Take 1 tablet by mouth daily. Reported on 07/31/2015  . Cholecalciferol (VITAMIN D PO) Take 5,000 Units by mouth daily.  . fluticasone (FLONASE) 50  MCG/ACT nasal spray Place 2 sprays into both nostrils daily.  . naproxen sodium (ANAPROX) 220 MG tablet Take 220 mg by mouth 2 (two) times daily with a meal. PRN  . predniSONE (DELTASONE) 10 MG tablet Take 1 tablet (10 mg total) by mouth daily with breakfast.  . telmisartan (MICARDIS) 80 MG tablet TAKE 1 TABLET BY MOUTH ONCE DAILY   No current facility-administered medications on file prior to visit.    Allergies  Allergen Reactions  . Codeine Nausea And Vomiting  . Hydrocodone Nausea And Vomiting  . Lisinopril Cough    cough  . Omeprazole Nausea And Vomiting   Past Medical History:  Diagnosis Date  . Elevated BP   . Prediabetes   . Vitamin D deficiency    Health Maintenance  Topic Date Due  . INFLUENZA VACCINE  03/09/2018  . TETANUS/TDAP  07/30/2025  . DEXA SCAN  Completed  . PNA vac Low Risk Adult  Completed   Immunization History  Administered Date(s) Administered  . DT 07/31/2015  . DTaP 06/09/2005  . Influenza, High Dose Seasonal PF 04/13/2017  . Influenza-Unspecified 04/09/2014, 05/10/2015, 04/22/2016  . Pneumococcal Conjugate-13 07/01/2014  . Pneumococcal-Unspecified 06/10/1999, 06/09/2008  . Zoster 06/10/2007   - Negative Cologard  08/20/2015  - Last MGM - 01/11/2018  History reviewed. No pertinent surgical history.   Family History  Problem Relation Age of Onset  . Breast cancer Mother    Social History   Tobacco Use  . Smoking status: Never Smoker  . Smokeless tobacco: Never  Used  Substance Use Topics  . Alcohol use: No    Alcohol/week: 0.0 standard drinks  . Drug use: Not on file    ROS Constitutional: Denies fever, chills, weight loss/gain, headaches, insomnia,  night sweats, and change in appetite. Does c/o fatigue. Eyes: Denies redness, blurred vision, diplopia, discharge, itchy, watery eyes.  ENT: Denies discharge, congestion, post nasal drip, epistaxis, sore throat, earache, hearing loss, dental pain, Tinnitus, Vertigo, Sinus pain, snoring.   Cardio: Denies chest pain, palpitations, irregular heartbeat, syncope, dyspnea, diaphoresis, orthopnea, PND, claudication, edema Respiratory: denies cough, dyspnea, DOE, pleurisy, hoarseness, laryngitis, wheezing.  Gastrointestinal: Denies dysphagia, heartburn, reflux, water brash, pain, cramps, nausea, vomiting, bloating, diarrhea, constipation, hematemesis, melena, hematochezia, jaundice, hemorrhoids Genitourinary: Denies dysuria, frequency, urgency, nocturia, hesitancy, discharge, hematuria, flank pain Breast: Breast lumps, nipple discharge, bleeding.  Musculoskeletal: Denies arthralgia, myalgia, stiffness, Jt. Swelling, pain, limp, and strain/sprain. Denies falls. Skin: Denies puritis, rash, hives, warts, acne, eczema, changing in skin lesion Neuro: No weakness, tremor, incoordination, spasms, paresthesia, pain Psychiatric: Denies confusion, memory loss, sensory loss. Denies Depression. Endocrine: Denies change in weight, skin, hair change, nocturia, and paresthesia, diabetic polys, visual blurring, hyper / hypo glycemic episodes.  Heme/Lymph: No excessive bleeding, bruising, enlarged lymph nodes.  Physical Exam  BP 124/84   Pulse 72   Temp (!) 97.2 F (36.2 C)   Resp 16   Ht 5\' 2"  (1.575 m)   Wt 192 lb 6.4 oz (87.3 kg)   BMI 35.19 kg/m   General Appearance: Over nourished and in no apparent distress.  Eyes: PERRLA, EOMs, conjunctiva no swelling or erythema, normal fundi and vessels. Sinuses: No frontal/maxillary tenderness ENT/Mouth: EACs patent / TMs  nl. Nares clear without erythema, swelling, mucoid exudates. Oral hygiene is good. No erythema, swelling, or exudate. Tongue normal, non-obstructing. Tonsils not swollen or erythematous. Hearing normal.  Neck: Supple, thyroid not palpable. No bruits, nodes or JVD. Respiratory: Respiratory effort normal.  BS equal and clear bilateral without rales, rhonci, wheezing or stridor. Cardio: Heart sounds are normal with regular rate and  rhythm and no murmurs, rubs or gallops. Peripheral pulses are normal and equal bilaterally without edema. No aortic or femoral bruits. Chest: symmetric with normal excursions and percussion. Breasts: Symmetric, without lumps, nipple discharge, retractions, or fibrocystic changes.  Abdomen: Flat, soft with bowel sounds active. Nontender, no guarding, rebound, hernias, masses, or organomegaly.  Lymphatics: Non tender without lymphadenopathy.  Musculoskeletal: Full ROM all peripheral extremities, joint stability, 5/5 strength, and normal gait. Skin: Warm and dry without rashes, lesions, cyanosis, clubbing or  ecchymosis.  Neuro: Cranial nerves intact, reflexes equal bilaterally. Normal muscle tone, no cerebellar symptoms. Sensation intact.  Pysch: Alert and oriented X 3, normal affect, Insight and Judgment appropriate.   Assessment and Plan  1. Annual Preventative Screening Examination  2. Essential hypertension  - EKG 12-Lead - Urinalysis, Routine w reflex microscopic - Microalbumin / creatinine urine ratio - CBC with Differential/Platelet - COMPLETE METABOLIC PANEL WITH GFR - Magnesium - TSH  3. Hyperlipidemia, mixed  - EKG 12-Lead - Lipid panel - TSH  4. Abnormal glucose  - EKG 12-Lead - Hemoglobin A1c - Insulin, random  5. Vitamin D deficiency  - VITAMIN D 25 Hydroxyl  6. Prediabetes  - EKG 12-Lead - Hemoglobin A1c - Insulin, random  7. Class 2 severe obesity due to excess calories with serious comorbidity and body mass index (BMI) of 35.0 to 35.9 in adult ([place])  8. Screening for colorectal cancer  - Cologuard  9. Screening  for ischemic heart disease  - EKG 12-Lead  10. Medication management  - Urinalysis, Routine w reflex microscopic - Microalbumin / creatinine urine ratio - CBC with Differential/Platelet - COMPLETE METABOLIC PANEL WITH GFR - Magnesium - Lipid panel - TSH - Hemoglobin A1c - Insulin, random - VITAMIN D 25 Hydroxyl        Patient  was counseled in prudent diet to achieve/maintain BMI less than 25 for weight control, BP monitoring, regular exercise and medications. Discussed med's effects and SE's. Screening labs and tests as requested with regular follow-up as recommended. Over 40 minutes of exam, counseling, chart review and high complex critical decision making was performed.

## 2018-10-24 ENCOUNTER — Other Ambulatory Visit: Payer: Self-pay

## 2018-10-24 ENCOUNTER — Ambulatory Visit (INDEPENDENT_AMBULATORY_CARE_PROVIDER_SITE_OTHER): Payer: Medicare Other | Admitting: Internal Medicine

## 2018-10-24 VITALS — BP 124/84 | HR 72 | Temp 97.2°F | Resp 16 | Ht 62.0 in | Wt 192.4 lb

## 2018-10-24 DIAGNOSIS — R7303 Prediabetes: Secondary | ICD-10-CM | POA: Diagnosis not present

## 2018-10-24 DIAGNOSIS — Z0000 Encounter for general adult medical examination without abnormal findings: Secondary | ICD-10-CM

## 2018-10-24 DIAGNOSIS — R7309 Other abnormal glucose: Secondary | ICD-10-CM | POA: Diagnosis not present

## 2018-10-24 DIAGNOSIS — Z136 Encounter for screening for cardiovascular disorders: Secondary | ICD-10-CM

## 2018-10-24 DIAGNOSIS — I10 Essential (primary) hypertension: Secondary | ICD-10-CM

## 2018-10-24 DIAGNOSIS — E559 Vitamin D deficiency, unspecified: Secondary | ICD-10-CM

## 2018-10-24 DIAGNOSIS — Z79899 Other long term (current) drug therapy: Secondary | ICD-10-CM | POA: Diagnosis not present

## 2018-10-24 DIAGNOSIS — Z1212 Encounter for screening for malignant neoplasm of rectum: Secondary | ICD-10-CM

## 2018-10-24 DIAGNOSIS — Z0001 Encounter for general adult medical examination with abnormal findings: Secondary | ICD-10-CM

## 2018-10-24 DIAGNOSIS — Z1211 Encounter for screening for malignant neoplasm of colon: Secondary | ICD-10-CM

## 2018-10-24 DIAGNOSIS — Z6835 Body mass index (BMI) 35.0-35.9, adult: Secondary | ICD-10-CM

## 2018-10-24 DIAGNOSIS — E782 Mixed hyperlipidemia: Secondary | ICD-10-CM

## 2018-10-25 LAB — COMPLETE METABOLIC PANEL WITH GFR
AG Ratio: 1.8 (calc) (ref 1.0–2.5)
ALBUMIN MSPROF: 4.6 g/dL (ref 3.6–5.1)
ALT: 28 U/L (ref 6–29)
AST: 29 U/L (ref 10–35)
Alkaline phosphatase (APISO): 57 U/L (ref 37–153)
BUN: 20 mg/dL (ref 7–25)
CALCIUM: 10 mg/dL (ref 8.6–10.4)
CO2: 26 mmol/L (ref 20–32)
Chloride: 105 mmol/L (ref 98–110)
Creat: 0.86 mg/dL (ref 0.60–0.93)
GFR, EST AFRICAN AMERICAN: 74 mL/min/{1.73_m2} (ref >=60)
GFR, EST NON AFRICAN AMERICAN: 64 mL/min/{1.73_m2} (ref >=60)
GLOBULIN: 2.6 g/dL (ref 1.9–3.7)
GLUCOSE: 99 mg/dL (ref 65–99)
Potassium: 4.5 mmol/L (ref 3.5–5.3)
SODIUM: 139 mmol/L (ref 135–146)
TOTAL PROTEIN: 7.2 g/dL (ref 6.1–8.1)
Total Bilirubin: 0.5 mg/dL (ref 0.2–1.2)

## 2018-10-25 LAB — LIPID PANEL
CHOL/HDL RATIO: 2.8 (calc) (ref <5.0)
Cholesterol: 149 mg/dL (ref <200)
HDL: 54 mg/dL (ref >=50)
LDL Cholesterol (Calc): 80 mg/dL (calc)
Non-HDL Cholesterol (Calc): 95 mg/dL (calc) (ref <130)
TRIGLYCERIDES: 72 mg/dL (ref <150)

## 2018-10-25 LAB — CBC WITH DIFFERENTIAL/PLATELET
ABSOLUTE MONOCYTES: 594 {cells}/uL (ref 200–950)
BASOS ABS: 50 {cells}/uL (ref 0–200)
BASOS PCT: 0.9 %
EOS ABS: 140 {cells}/uL (ref 15–500)
Eosinophils Relative: 2.5 %
HCT: 42 % (ref 35.0–45.0)
HEMOGLOBIN: 14.4 g/dL (ref 11.7–15.5)
LYMPHS ABS: 2531 {cells}/uL (ref 850–3900)
MCH: 30.6 pg (ref 27.0–33.0)
MCHC: 34.3 g/dL (ref 32.0–36.0)
MCV: 89.4 fL (ref 80.0–100.0)
MPV: 10.2 fL (ref 7.5–12.5)
Monocytes Relative: 10.6 %
NEUTROS ABS: 2285 {cells}/uL (ref 1500–7800)
Neutrophils Relative %: 40.8 %
Platelets: 290 10*3/uL (ref 140–400)
RBC: 4.7 10*6/uL (ref 3.80–5.10)
RDW: 13.7 % (ref 11.0–15.0)
Total Lymphocyte: 45.2 %
WBC: 5.6 10*3/uL (ref 3.8–10.8)

## 2018-10-25 LAB — URINALYSIS, ROUTINE W REFLEX MICROSCOPIC
BILIRUBIN URINE: NEGATIVE
Bacteria, UA: NONE SEEN /HPF
GLUCOSE, UA: NEGATIVE
Hgb urine dipstick: NEGATIVE
Hyaline Cast: NONE SEEN /LPF
KETONES UR: NEGATIVE
NITRITE: NEGATIVE
Protein, ur: NEGATIVE
RBC / HPF: NONE SEEN /HPF (ref 0–2)
SPECIFIC GRAVITY, URINE: 1.005 (ref 1.001–1.03)
SQUAMOUS EPITHELIAL / LPF: NONE SEEN /HPF (ref <=5)
pH: 6.5 (ref 5.0–8.0)

## 2018-10-25 LAB — MICROALBUMIN / CREATININE URINE RATIO
Creatinine, Urine: 20 mg/dL (ref 20–275)
Microalb, Ur: <0.2 mg/dL

## 2018-10-25 LAB — INSULIN, RANDOM: Insulin: 13.9 u[IU]/mL

## 2018-10-25 LAB — TSH: TSH: 2.1 m[IU]/L (ref 0.40–4.50)

## 2018-10-25 LAB — VITAMIN D 25 HYDROXY (VIT D DEFICIENCY, FRACTURES): VIT D 25 HYDROXY: 39 ng/mL (ref 30–100)

## 2018-10-25 LAB — HEMOGLOBIN A1C
HEMOGLOBIN A1C: 5.9 %{Hb} — AB (ref <5.7)
MEAN PLASMA GLUCOSE: 123 (calc)
eAG (mmol/L): 6.8 (calc)

## 2018-10-25 LAB — MAGNESIUM: Magnesium: 2.3 mg/dL (ref 1.5–2.5)

## 2018-10-28 ENCOUNTER — Encounter: Payer: Self-pay | Admitting: Internal Medicine

## 2018-11-03 DIAGNOSIS — Z1211 Encounter for screening for malignant neoplasm of colon: Secondary | ICD-10-CM | POA: Diagnosis not present

## 2018-11-03 DIAGNOSIS — Z1212 Encounter for screening for malignant neoplasm of rectum: Secondary | ICD-10-CM | POA: Diagnosis not present

## 2018-11-07 LAB — COLOGUARD: Cologuard: NEGATIVE

## 2018-11-08 ENCOUNTER — Encounter: Payer: Self-pay | Admitting: *Deleted

## 2018-12-08 ENCOUNTER — Other Ambulatory Visit: Payer: Self-pay | Admitting: Internal Medicine

## 2018-12-08 DIAGNOSIS — Z1231 Encounter for screening mammogram for malignant neoplasm of breast: Secondary | ICD-10-CM

## 2019-01-03 ENCOUNTER — Other Ambulatory Visit: Payer: Self-pay | Admitting: Internal Medicine

## 2019-01-05 ENCOUNTER — Other Ambulatory Visit: Payer: Self-pay | Admitting: Internal Medicine

## 2019-01-24 NOTE — Progress Notes (Signed)
Virtual Visit via Telephone Note  I connected with [name] on 01/26/19 at 11:15 AM EDT by telephone and verified that I am speaking with the correct person using two identifiers.  Location: Patient: home Provider: [place] office    I discussed the limitations, risks, security and privacy concerns of performing an evaluation and management service by telephone and the availability of in person appointments. I also discussed with the patient that there may be a patient responsible charge related to this service. The patient expressed understanding and agreed to proceed.  I discussed the assessment and treatment plan with the patient. The patient was provided an opportunity to ask questions and all were answered. The patient agreed with the plan and demonstrated an understanding of the instructions.   The patient was advised to call back or seek an in-person evaluation if the symptoms worsen or if the condition fails to improve as anticipated.  I provided 28 minutes of non-face-to-face time during this encounter.   Izora Ribas, NP    MEDICARE ANNUAL WELLNESS VISIT AND FOLLOW UP  Assessment:    Medicare annual wellness visit  Labile hypertension - continue medications, DASH diet, exercise and monitor at home. Call if greater than 130/80.  -     CBC with Differential/Platelet -     CMP/GFR -     TSH  Prediabetes Discussed general issues about diabetes pathophysiology and management., Educational material distributed., Suggested low cholesterol diet., Encouraged aerobic exercise., Discussed foot care., Reminded to get yearly retinal exam.  Elevated cholesterol -continue medications, check lipids, decrease fatty foods, increase activity.  -     Lipid panel  Vitamin D deficiency Continue supplementation Check vitamin D level   Obesity with co morbidities - long discussion about weight loss, diet, and exercise  DJD, multiple sites Controlled, uses prednisone PRN  for severe pain, hasn't needed recently  She declines lab visit; last labs reviewed; no significant concerns or medication changes; appropriate to defer   Over 30 minutes of exam, counseling, chart review, and critical decision making was performed  Future Appointments  Date Time Provider [place]  02/01/2019  3:10 PM [facility] 3 [facility]  05/03/2019 11:00 AM [name], MD [facility] None  11/07/2019  9:00 AM [name], MD [facility] None    Plan:   During the course of the visit the patient was educated and counseled about appropriate screening and preventive services including:    Pneumococcal vaccine   Influenza vaccine  Td vaccine  Prevnar 13  Screening electrocardiogram  Screening mammography  Bone densitometry screening  Colorectal cancer screening  Diabetes screening  Glaucoma screening  Nutrition counseling   Advanced directives: given info/requested copies   Subjective:   Angelica Romero is a 78 y.o. female who presents for Medicare Annual Wellness Visit and 3 month follow up on hypertension, prediabetes, hyperlipidemia, vitamin D def and morbid obesity.  Patient has hx of disabling polyarthralgias without clear etiology but doing well with PRN prednisone use, not needing frequently recently, and maintains mobility and independence.   Her great grandchild was born prematurely and has been in the hospital, not doing well.   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  Wt Readings from Last 3 Encounters:  01/26/19 190 lb (86.2 kg)  10/24/18 192 lb 6.4 oz (87.3 kg)  04/18/18 193 lb 6.4 oz (87.7 kg)   Her blood pressure has been controlled at home, today their BP is BP: 127/76 She does workout. She denies chest pain, shortness of breath,  dizziness.   She is not on cholesterol medication and denies myalgias. Her cholesterol is at goal. The cholesterol last visit was:   Lab Results  Component Value Date   CHOL 149 10/24/2018   HDL 54 10/24/2018   LDLCALC 80 10/24/2018   TRIG 72 10/24/2018   CHOLHDL 2.8 10/24/2018   She reports that she has been working on diet and exercise.  She walks daily.   Lab Results  Component Value Date   HGBA1C 5.9 (H) 10/24/2018   Last GFR Lab Results  Component Value Date   GFRNONAA 64 10/24/2018   Patient is on Vitamin D supplement, has started taking regularly, 5000 IU since  Lab Results  Component Value Date   VD25OH 39 10/24/2018       Medication Review Current Outpatient Medications on File Prior to Visit  Medication Sig Dispense Refill  . aspirin EC 81 MG tablet Take 81 mg by mouth daily. Reported on 07/31/2015    . Biotin 5000 MCG TABS Take 1 tablet by mouth daily. Reported on 07/31/2015    . Cholecalciferol (VITAMIN D PO) Take 5,000 Units by mouth daily.    . fluticasone (FLONASE) 50 MCG/ACT nasal spray Place 2 sprays into both nostrils daily. 16 g 3  . naproxen sodium (ANAPROX) 220 MG tablet Take 220 mg by mouth 2 (two) times daily with a meal. PRN    . predniSONE (DELTASONE) 10 MG tablet Take 1 tablet (10 mg total) by mouth daily with breakfast. 90 tablet 1  . telmisartan (MICARDIS) 80 MG tablet Take 1 tablet by mouth once daily 90 tablet 0   No current facility-administered medications on file prior to visit.     Allergies: Allergies  Allergen Reactions  . Codeine Nausea And Vomiting  . Hydrocodone Nausea And Vomiting  . Lisinopril Cough    cough  . Omeprazole Nausea And Vomiting    Current Problems (verified) has Labile hypertension; Other abnormal glucose (prediabetes); Vitamin D deficiency; Encounter for Medicare annual wellness exam; Hyperlipidemia; DJD (degenerative joint disease), multiple sites; and Morbid obesity ([place]) on their problem list.  Screening  Tests Immunization History  Administered Date(s) Administered  . DT 07/31/2015  . DTaP 06/09/2005  . Influenza, High Dose Seasonal PF 04/13/2017  . Influenza-Unspecified 04/09/2014, 05/10/2015, 04/22/2016  . Pneumococcal Conjugate-13 07/01/2014  . Pneumococcal-Unspecified 06/10/1999, 06/09/2008  . Zoster 06/10/2007    Preventative care: Last colonoscopy: cologuard - 10/2018 negative - DONE Last mammogram: 01/2018, has scheduled on 6/25 DEXA: 2013 normal - defer for now 06/2014 lumbar spine CXR 2007  Prior vaccinations: TD or Tdap: 2016  Influenza: 2019  Pneumococcal: 2009 Prevnar13: 2015 Shingles/Zostavax: 2008  Names of Other Physician/Practitioners you currently use: 1. [facility]- here for primary care 2. Dr. Santo Held, eye doctor, last visit 2019, follow up 03/2019 3. Dr. Orene Desanctis, dentist, last visit 2020,  q 6 months  Patient Care Team: [name], MD as PCP - General (Internal Medicine) Monna Fam, MD as Consulting Physician (Ophthalmology) Druscilla Brownie, MD as Consulting Physician (Dermatology)  Surgical: She  has no past surgical history on file. Family Her family history includes Breast cancer in her mother. Social history  She reports that she has never smoked. She has never used smokeless tobacco. She reports that she does not  drink alcohol. No history on file for drug.  MEDICARE WELLNESS OBJECTIVES: Physical activity:   Cardiac risk factors:   Depression/mood screen:   Depression screen [facility] 2/9 10/23/2018  Decreased Interest 0  Down, Depressed, Hopeless 0  PHQ - 2 Score 0    ADLs:  In your present state of health, do you have any difficulty performing the following activities: 10/23/2018 04/18/2018  Hearing? N N  Vision? N N  Difficulty concentrating or making decisions? N N  Walking or climbing stairs? N N  Dressing or bathing? N N  Doing errands, shopping? N N  Some recent data might be hidden      Cognitive Testing  Alert? Yes  Normal Appearance?Yes  Oriented to person? Yes  Place? Yes   Time? Yes  Recall of three objects?  Yes  Can perform simple calculations? Yes  Displays appropriate judgment?Yes  Can read the correct time from a watch face?Yes  EOL planning:     Objective:   Today's Vitals   01/26/19 1120  BP: 127/76  Pulse: 72  Weight: 190 lb (86.2 kg)   Body mass index is 34.75 kg/m.  General appearance: alert, no distress, WD/WN,  female HEENT: normocephalic, sclerae anicteric, TMs pearly, nares patent, no discharge or erythema, pharynx normal Oral cavity: MMM, no lesions Neck: supple, no lymphadenopathy, no thyromegaly, no masses Heart: RRR, normal S1, S2, no murmurs Lungs: CTA bilaterally, no wheezes, rhonchi, or rales Abdomen: +bs, soft, non tender, non distended, no masses, no hepatomegaly, no splenomegaly Musculoskeletal: nontender, no swelling, no obvious deformity Extremities: no edema, no cyanosis, no clubbing Pulses: 2+ symmetric, upper and lower extremities, normal cap refill Neurological: alert, oriented x 3, CN2-12 intact, strength normal upper extremities and lower extremities, sensation normal throughout, DTRs 2+ throughout, no cerebellar signs, gait normal Psychiatric: normal affect, behavior normal, pleasant  Breast: defer Gyn: defer Rectal: defer   Medicare Attestation I have personally reviewed: The patient's medical and social history Their use of alcohol, tobacco or illicit drugs Their current medications and supplements The patient's functional ability including ADLs,fall risks, home safety risks, cognitive, and hearing and visual impairment Diet and physical activities Evidence for depression or mood disorders  The patient's weight, height, BMI, and visual acuity have been recorded in the chart.  I have made referrals, counseling, and provided education to the patient based on review of the above and I have provided the  patient with a written personalized care plan for preventive services.     Izora Ribas, NP   01/26/2019

## 2019-01-26 ENCOUNTER — Other Ambulatory Visit: Payer: Self-pay

## 2019-01-26 ENCOUNTER — Ambulatory Visit: Payer: Medicare Other | Admitting: Adult Health

## 2019-01-26 ENCOUNTER — Encounter: Payer: Self-pay | Admitting: Adult Health

## 2019-01-26 VITALS — BP 127/76 | HR 72 | Wt 190.0 lb

## 2019-01-26 DIAGNOSIS — R6889 Other general symptoms and signs: Secondary | ICD-10-CM

## 2019-01-26 DIAGNOSIS — E782 Mixed hyperlipidemia: Secondary | ICD-10-CM

## 2019-01-26 DIAGNOSIS — R0989 Other specified symptoms and signs involving the circulatory and respiratory systems: Secondary | ICD-10-CM

## 2019-01-26 DIAGNOSIS — M159 Polyosteoarthritis, unspecified: Secondary | ICD-10-CM | POA: Diagnosis not present

## 2019-01-26 DIAGNOSIS — Z0000 Encounter for general adult medical examination without abnormal findings: Secondary | ICD-10-CM

## 2019-01-26 DIAGNOSIS — Z0001 Encounter for general adult medical examination with abnormal findings: Secondary | ICD-10-CM | POA: Diagnosis not present

## 2019-01-26 DIAGNOSIS — E559 Vitamin D deficiency, unspecified: Secondary | ICD-10-CM

## 2019-01-26 DIAGNOSIS — R7309 Other abnormal glucose: Secondary | ICD-10-CM | POA: Diagnosis not present

## 2019-01-26 MED ORDER — TELMISARTAN 80 MG PO TABS: 80.0000 mg | ORAL_TABLET | Freq: Every day | ORAL | 1 refills | Status: DC

## 2019-02-01 ENCOUNTER — Ambulatory Visit
Admission: RE | Admit: 2019-02-01 | Discharge: 2019-02-01 | Disposition: A | Discharge status: Home / Self Care | Payer: Medicare Other | Source: Ambulatory Visit | Attending: Internal Medicine | Admitting: Internal Medicine

## 2019-02-01 ENCOUNTER — Other Ambulatory Visit: Payer: Self-pay

## 2019-02-01 DIAGNOSIS — Z1231 Encounter for screening mammogram for malignant neoplasm of breast: Secondary | ICD-10-CM | POA: Diagnosis not present

## 2019-03-12 DIAGNOSIS — C44329 Squamous cell carcinoma of skin of other parts of face: Secondary | ICD-10-CM | POA: Diagnosis not present

## 2019-03-12 DIAGNOSIS — L821 Other seborrheic keratosis: Secondary | ICD-10-CM | POA: Diagnosis not present

## 2019-03-12 DIAGNOSIS — L814 Other melanin hyperpigmentation: Secondary | ICD-10-CM | POA: Diagnosis not present

## 2019-03-12 DIAGNOSIS — L570 Actinic keratosis: Secondary | ICD-10-CM | POA: Diagnosis not present

## 2019-03-12 DIAGNOSIS — D225 Melanocytic nevi of trunk: Secondary | ICD-10-CM | POA: Diagnosis not present

## 2019-03-12 DIAGNOSIS — L718 Other rosacea: Secondary | ICD-10-CM | POA: Diagnosis not present

## 2019-04-10 DIAGNOSIS — C44329 Squamous cell carcinoma of skin of other parts of face: Secondary | ICD-10-CM | POA: Diagnosis not present

## 2019-05-02 ENCOUNTER — Ambulatory Visit: Payer: Self-pay | Admitting: Adult Health

## 2019-05-02 ENCOUNTER — Encounter: Payer: Self-pay | Admitting: Internal Medicine

## 2019-05-02 NOTE — Progress Notes (Signed)
History of Present Illness:      This very nice 78 y.o. [name] presents for 6 month follow up with HTN, HLD, Pre-Diabetes and Vitamin D Deficiency.  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 .      Patient is treated for HTN (2016)  & BP has been controlled at home. Today's BP was at goal - 126/76. Patient has had no complaints of any cardiac type chest pain, palpitations, dyspnea / orthopnea / PND, dizziness, claudication, or dependent edema.      Hyperlipidemia is controlled with diet & meds. Patient denies myalgias or other med SE's. Last Lipids were at goal:  Lab Results  Component Value Date   CHOL 149 10/24/2018   HDL 54 10/24/2018   LDLCALC 80 10/24/2018   TRIG 72 10/24/2018   CHOLHDL 2.8 10/24/2018       Also, the patient has Morbid Obesity (BMI 35+)  & history of PreDiabetes(A1c 5.9% / 2016)  and has had no symptoms of reactive hypoglycemia, diabetic polys, paresthesias or visual blurring.  Last A1c was not at goal:  Lab Results  Component Value Date   HGBA1C 5.9 (H) 10/24/2018       Further, the patient also has history of Vitamin D Deficiency ("33"/ 2016)  and supplements vitamin D without any suspected side-effects. Last vitamin D was still low :  Lab Results  Component Value Date   VD25OH 39 10/24/2018   Current Outpatient Medications on File Prior to Visit  Medication Sig  . aspirin EC 81 MG tablet Take 81 mg by mouth daily. Reported on 07/31/2015  . Biotin 5000 MCG TABS Take 1 tablet by mouth daily. Reported on 07/31/2015  . Cholecalciferol (VITAMIN D PO) Take 5,000 Units by mouth daily.  . naproxen sodium (ANAPROX) 220 MG tablet Take 220 mg by mouth 2 (two) times daily with a meal. PRN  . telmisartan (MICARDIS) 80 MG tablet Take 1 tablet (80 mg total) by mouth daily.   No current facility-administered medications on file prior to  visit.    Allergies  Allergen Reactions  . Codeine Nausea And Vomiting  . Hydrocodone Nausea And Vomiting  . Lisinopril Cough    cough  . Omeprazole Nausea And Vomiting   PMHx:   Past Medical History:  Diagnosis Date  . Elevated BP   . Prediabetes   . Vitamin D deficiency    Immunization History  Administered Date(s) Administered  . DT 07/31/2015  . DTaP 06/09/2005  . Influenza, High Dose Seasonal PF 04/13/2017  . Influenza-Unspecified 04/09/2014, 05/10/2015, 04/22/2016  . Pneumococcal Conjugate-13 07/01/2014  . Pneumococcal-Unspecified 06/10/1999, 06/09/2008  . Zoster 06/10/2007   History reviewed. No pertinent surgical history.  FHx:    Reviewed / unchanged  SHx:    Reviewed / unchanged   Systems Review:  Constitutional: Denies fever, chills, wt changes, headaches, insomnia, fatigue, night sweats, change in appetite. Eyes: Denies redness, blurred vision, diplopia, discharge, itchy, watery eyes.  ENT: Denies discharge, congestion, post nasal drip, epistaxis, sore throat, earache, hearing loss, dental pain, tinnitus, vertigo, sinus pain, snoring.  CV: Denies chest pain, palpitations, irregular heartbeat, syncope, dyspnea, diaphoresis, orthopnea, PND, claudication or edema. Respiratory: denies cough, dyspnea, DOE, pleurisy, hoarseness, laryngitis, wheezing.  Gastrointestinal: Denies dysphagia, odynophagia, heartburn, reflux, water brash, abdominal pain or cramps, nausea, vomiting, bloating, diarrhea, constipation, hematemesis, melena, hematochezia  or hemorrhoids. Genitourinary:  Denies dysuria, frequency, urgency, nocturia, hesitancy, discharge, hematuria or flank pain. Musculoskeletal: Denies arthralgias, myalgias, stiffness, jt. swelling, pain, limping or strain/sprain.  Skin: Denies pruritus, rash, hives, warts, acne, eczema or change in skin lesion(s). Neuro: No weakness, tremor, incoordination, spasms, paresthesia or pain. Psychiatric: Denies confusion, memory loss or  sensory loss. Endo: Denies change in weight, skin or hair change.  Heme/Lymph: No excessive bleeding, bruising or enlarged lymph nodes.  Physical Exam  BP 126/76   Pulse 68   Temp 97.6 F (36.4 C)   Resp 16   Ht 5\' 2"  (1.575 m)   Wt 193 lb (87.5 kg)   BMI 35.30 kg/m   Appears  well nourished, well groomed  and in no distress.  Eyes: PERRLA, EOMs, conjunctiva no swelling or erythema. Sinuses: No frontal/maxillary tenderness ENT/Mouth: EAC's clear, TM's nl w/o erythema, bulging. Nares clear w/o erythema, swelling, exudates. Oropharynx clear without erythema or exudates. Oral hygiene is good. Tongue normal, non obstructing. Hearing intact.  Neck: Supple. Thyroid not palpable. Car 2+/2+ without bruits, nodes or JVD. Chest: Respirations nl with BS clear & equal w/o rales, rhonchi, wheezing or stridor.  Cor: Heart sounds normal w/ regular rate and rhythm without sig. murmurs, gallops, clicks or rubs. Peripheral pulses normal and equal  without edema.  Abdomen: Soft & bowel sounds normal. Non-tender w/o guarding, rebound, hernias, masses or organomegaly.  Lymphatics: Unremarkable.  Musculoskeletal: Full ROM all peripheral extremities, joint stability, 5/5 strength and normal gait.  Skin: Warm, dry without exposed rashes, lesions or ecchymosis apparent.  Neuro: Cranial nerves intact, reflexes equal bilaterally. Sensory-motor testing grossly intact. Tendon reflexes grossly intact.  Pysch: Alert & oriented x 3.  Insight and judgement nl & appropriate. No ideations.  Assessment and Plan:  1. Essential hypertension  - Continue medication, monitor blood pressure at home.  - Continue DASH diet.  Reminder to go to the ER if any CP,  SOB, nausea, dizziness, severe HA, changes vision/speech.  - CBC with Differential/Platelet - COMPLETE METABOLIC PANEL WITH GFR - Magnesium - TSH  2. Hyperlipidemia, mixed  - Continue diet/meds, exercise,& lifestyle modifications.  - Continue monitor  periodic cholesterol/liver & renal functions   - Lipid panel - TSH  3. Abnormal glucose  - Continue diet, exercise  - Lifestyle modifications.  - Monitor appropriate labs.  - Hemoglobin A1c - Insulin, random  4. Vitamin D deficiency  - Continue supplementation.  - VITAMIN D 25 Hydroxyl  5. Prediabetes  - Hemoglobin A1c - Insulin, random  6. Osteoarthritis of multiple joints   7. Medication management  - CBC with Differential/Platelet - COMPLETE METABOLIC PANEL WITH GFR - Magnesium - Lipid panel - TSH - Hemoglobin A1c - Insulin, random - VITAMIN D 25 Hydroxyl        Discussed  regular exercise, BP monitoring, weight control to achieve/maintain BMI less than 25 and discussed med and SE's. Recommended labs to assess and monitor clinical status with further disposition pending results of labs.  I discussed the assessment and treatment plan with the patient. The patient was provided an opportunity to ask questions and all were answered. The patient agreed with the plan and demonstrated an understanding of the instructions.  I provided over 30 minutes of exam, counseling, chart review and  complex critical decision making.  Kirtland Bouchard, MD

## 2019-05-02 NOTE — Patient Instructions (Signed)

## 2019-05-03 ENCOUNTER — Ambulatory Visit (INDEPENDENT_AMBULATORY_CARE_PROVIDER_SITE_OTHER): Payer: Medicare Other | Admitting: Internal Medicine

## 2019-05-03 ENCOUNTER — Other Ambulatory Visit: Payer: Self-pay

## 2019-05-03 VITALS — BP 126/76 | HR 68 | Temp 97.6°F | Resp 16 | Ht 62.0 in | Wt 193.0 lb

## 2019-05-03 DIAGNOSIS — I10 Essential (primary) hypertension: Secondary | ICD-10-CM | POA: Diagnosis not present

## 2019-05-03 DIAGNOSIS — E782 Mixed hyperlipidemia: Secondary | ICD-10-CM | POA: Diagnosis not present

## 2019-05-03 DIAGNOSIS — M159 Polyosteoarthritis, unspecified: Secondary | ICD-10-CM

## 2019-05-03 DIAGNOSIS — R7303 Prediabetes: Secondary | ICD-10-CM

## 2019-05-03 DIAGNOSIS — E559 Vitamin D deficiency, unspecified: Secondary | ICD-10-CM

## 2019-05-03 DIAGNOSIS — R7309 Other abnormal glucose: Secondary | ICD-10-CM | POA: Diagnosis not present

## 2019-05-03 DIAGNOSIS — Z79899 Other long term (current) drug therapy: Secondary | ICD-10-CM

## 2019-05-03 MED ORDER — TOPIRAMATE 50 MG PO TABS: ORAL_TABLET | ORAL | 1 refills | Status: DC

## 2019-05-04 LAB — CBC WITH DIFFERENTIAL/PLATELET
Absolute Monocytes: 513 cells/uL (ref 200–950)
Basophils Absolute: 32 cells/uL (ref 0–200)
Basophils Relative: 0.6 %
Eosinophils Absolute: 151 cells/uL (ref 15–500)
Eosinophils Relative: 2.8 %
HCT: 42.9 % (ref 35.0–45.0)
Hemoglobin: 14.3 g/dL (ref 11.7–15.5)
Lymphs Abs: 2165 cells/uL (ref 850–3900)
MCH: 30.6 pg (ref 27.0–33.0)
MCHC: 33.3 g/dL (ref 32.0–36.0)
MCV: 91.9 fL (ref 80.0–100.0)
MPV: 10.5 fL (ref 7.5–12.5)
Monocytes Relative: 9.5 %
Neutro Abs: 2538 cells/uL (ref 1500–7800)
Neutrophils Relative %: 47 %
Platelets: 280 10*3/uL (ref 140–400)
RBC: 4.67 10*6/uL (ref 3.80–5.10)
RDW: 13.6 % (ref 11.0–15.0)
Total Lymphocyte: 40.1 %
WBC: 5.4 10*3/uL (ref 3.8–10.8)

## 2019-05-04 LAB — LIPID PANEL
Cholesterol: 132 mg/dL (ref <200)
HDL: 44 mg/dL — ABNORMAL LOW (ref >=50)
LDL Cholesterol (Calc): 72 mg/dL (calc)
Non-HDL Cholesterol (Calc): 88 mg/dL (calc) (ref <130)
Total CHOL/HDL Ratio: 3 (calc) (ref <5.0)
Triglycerides: 77 mg/dL (ref <150)

## 2019-05-04 LAB — COMPLETE METABOLIC PANEL WITH GFR
AG Ratio: 1.8 (calc) (ref 1.0–2.5)
ALT: 21 U/L (ref 6–29)
AST: 26 U/L (ref 10–35)
Albumin: 4.4 g/dL (ref 3.6–5.1)
Alkaline phosphatase (APISO): 59 U/L (ref 37–153)
BUN: 16 mg/dL (ref 7–25)
CO2: 23 mmol/L (ref 20–32)
Calcium: 9.9 mg/dL (ref 8.6–10.4)
Chloride: 107 mmol/L (ref 98–110)
Creat: 0.74 mg/dL (ref 0.60–0.88)
GFR, Est African American: 89 mL/min/{1.73_m2} (ref >=60)
GFR, Est Non African American: 77 mL/min/{1.73_m2} (ref >=60)
Globulin: 2.5 g/dL (calc) (ref 1.9–3.7)
Glucose, Bld: 99 mg/dL (ref 65–99)
Potassium: 4.4 mmol/L (ref 3.5–5.3)
Sodium: 139 mmol/L (ref 135–146)
Total Bilirubin: 0.5 mg/dL (ref 0.2–1.2)
Total Protein: 6.9 g/dL (ref 6.1–8.1)

## 2019-05-04 LAB — INSULIN, RANDOM: Insulin: 17.1 u[IU]/mL

## 2019-05-04 LAB — HEMOGLOBIN A1C
Hgb A1c MFr Bld: 5.7 % of total Hgb — ABNORMAL HIGH (ref <5.7)
Mean Plasma Glucose: 117 (calc)
eAG (mmol/L): 6.5 (calc)

## 2019-05-04 LAB — TSH: TSH: 1.86 mIU/L (ref 0.40–4.50)

## 2019-05-04 LAB — VITAMIN D 25 HYDROXY (VIT D DEFICIENCY, FRACTURES): Vit D, 25-Hydroxy: 42 ng/mL (ref 30–100)

## 2019-05-04 LAB — MAGNESIUM: Magnesium: 2.2 mg/dL (ref 1.5–2.5)

## 2019-05-07 DIAGNOSIS — L718 Other rosacea: Secondary | ICD-10-CM | POA: Diagnosis not present

## 2019-05-07 DIAGNOSIS — L570 Actinic keratosis: Secondary | ICD-10-CM | POA: Diagnosis not present

## 2019-05-07 DIAGNOSIS — Z85828 Personal history of other malignant neoplasm of skin: Secondary | ICD-10-CM | POA: Diagnosis not present

## 2019-06-18 DIAGNOSIS — Z85828 Personal history of other malignant neoplasm of skin: Secondary | ICD-10-CM | POA: Diagnosis not present

## 2019-06-18 DIAGNOSIS — L905 Scar conditions and fibrosis of skin: Secondary | ICD-10-CM | POA: Diagnosis not present

## 2019-06-18 DIAGNOSIS — L570 Actinic keratosis: Secondary | ICD-10-CM | POA: Diagnosis not present

## 2019-06-21 DIAGNOSIS — H26491 Other secondary cataract, right eye: Secondary | ICD-10-CM | POA: Diagnosis not present

## 2019-06-21 DIAGNOSIS — H353111 Nonexudative age-related macular degeneration, right eye, early dry stage: Secondary | ICD-10-CM | POA: Diagnosis not present

## 2019-06-21 DIAGNOSIS — H40013 Open angle with borderline findings, low risk, bilateral: Secondary | ICD-10-CM | POA: Diagnosis not present

## 2019-06-21 DIAGNOSIS — H35362 Drusen (degenerative) of macula, left eye: Secondary | ICD-10-CM | POA: Diagnosis not present

## 2019-06-21 LAB — HM DIABETES EYE EXAM

## 2019-06-27 ENCOUNTER — Encounter: Payer: Self-pay | Admitting: *Deleted

## 2019-07-02 DIAGNOSIS — H26491 Other secondary cataract, right eye: Secondary | ICD-10-CM | POA: Diagnosis not present

## 2019-07-07 ENCOUNTER — Other Ambulatory Visit: Payer: Self-pay | Admitting: Physician Assistant

## 2019-07-31 ENCOUNTER — Ambulatory Visit: Payer: Medicare Other | Attending: Internal Medicine

## 2019-07-31 DIAGNOSIS — Z20822 Contact with and (suspected) exposure to covid-19: Secondary | ICD-10-CM

## 2019-08-01 LAB — NOVEL CORONAVIRUS, NAA: SARS-CoV-2, NAA: NOT DETECTED

## 2019-08-09 ENCOUNTER — Ambulatory Visit: Payer: Medicare Other | Admitting: Adult Health Nurse Practitioner

## 2019-08-28 NOTE — Progress Notes (Signed)
3 Month Follow Up   Assessment and Plan:   Angelica Romero was seen today for follow-up.  Diagnoses and all orders for this visit:  Essential hypertension Continue current medications: Telmisartan 80mg  daily Monitor blood pressure at home; call if consistently over 130/80 Continue DASH diet.   Reminder to go to the ER if any CP, SOB, nausea, dizziness, severe HA, changes vision/speech, left arm numbness and tingling and jaw pain. -     CBC with Differential/Platelet -     COMPLETE METABOLIC PANEL WITH GFR -     TSH -     Magnesium  Hyperlipidemia, mixed Continue medications: Discussed dietary and exercise modifications Low fat diet -     Lipid panel  Abnormal glucose Discussed dietary and exercise modifications -     Hemoglobin A1c  Vitamin D deficiency  Continue supplementation Taking Vitamin D 5,000 IU daily  -     VITAMIN D 25 Hydroxy (Vit-D Deficiency, Fractures)  Morbid obesity ([place]) Discussed dietary and exercise modifications  Osteoarthritis of multiple joints, unspecified osteoarthritis type Doing well at this time Taking Aleve BID PRN Also using topical OTC PRN  Medication Management Continued  Caregiver role strain Discussed resources to assist with this Discussed importance of self care and taking time for self Discuss with Palliative care today  Continue diet and meds as discussed. Further disposition pending results of labs. Discussed med's effects and SE's.  Patient agrees with plan of care and opportunity to ask questions/voice concerns. Over 30 minutes of chart review, face to face interview, exam, counseling, and critical decision making was performed.   Future Appointments  Date Time Provider [place]  12/04/2019 11:00 AM [name], MD [facility] None  03/05/2020 10:00 AM [name], NP [facility] None     ----------------------------------------------------------------------------------------------------------------------  HPI 79 y.o. female  presents for 3 month follow up on HTN, HLD, abnormal glucose with history of pre-diabetes, weight and vitamin D deficiency.   Report she had COVID 11/20.  She reports that she had flu like symptoms. Doing well at this time.  She reports that her significant other (92) was hospitalized with COVID-19 and in the hospital for 15days.  He is home now and [facility] was checking on him.  She reports he is alert and oriented and she has been the caregiver.  Reports pallative care is coming this afternoon to help.    BMI is Body mass index is 33.4 kg/m., she has not been working on diet and exercise. Wt Readings from Last 3 Encounters:  08/30/19 182 lb 9.6 oz (82.8 kg)  05/03/19 193 lb (87.5 kg)  01/26/19 190 lb (86.2 kg)     HTN predates 2016 Her blood pressure has been controlled at home, today their BP is BP: 120/74  She does not workout. She denies any cardiac symptoms, chest pains, palpitations, shortness of breath, dizziness or lower extremity edema.      She is not on cholesterol medication  and denies myalgias.   Her cholesterol is not at goal. The cholesterol last visit was:   Lab Results  Component Value Date   CHOL 132 05/03/2019   HDL 44 (L) 05/03/2019   LDLCALC 72 05/03/2019   TRIG 77 05/03/2019   CHOLHDL 3.0 05/03/2019    She has not been working on diet and exercise for prediabetes, and denies polydipsia, polyuria, visual disturbances, vomiting and weight loss. Last A1C in the office was:  Lab Results  Component Value Date   HGBA1C 5.7 (H) 05/03/2019  Patient does not have history of CKD.  Their last GFR was:  Lab Results  Component Value Date   GFRNONAA 77 05/03/2019   Lab Results  Component Value Date   GFRAA 89 05/03/2019     Patient is on Vitamin D supplement.   Lab Results  Component Value Date    VD25OH 42 05/03/2019       Current Medications:  Current Outpatient Medications on File Prior to Visit  Medication Sig  . aspirin EC 81 MG tablet Take 81 mg by mouth daily. Reported on 07/31/2015  . Cholecalciferol (VITAMIN D PO) Take 5,000 Units by mouth daily.  . naproxen sodium (ANAPROX) 220 MG tablet Take 220 mg by mouth 2 (two) times daily with a meal. PRN  . telmisartan (MICARDIS) 80 MG tablet Take 1 tablet Daily for BP  . topiramate (TOPAMAX) 50 MG tablet Take 1/2 to 1 tablet 2 x /day at Suppertime & Bedtime for Dieting & Weight Loss   No current facility-administered medications on file prior to visit.    Allergies:  Allergies  Allergen Reactions  . Codeine Nausea And Vomiting  . Hydrocodone Nausea And Vomiting  . Lisinopril Cough    cough  . Omeprazole Nausea And Vomiting      Medical History:  Past Medical History:  Diagnosis Date  . Elevated BP   . Prediabetes   . Vitamin D deficiency      Family history- Reviewed and unchanged Family History  Problem Relation Age of Onset  . Breast cancer Mother      Social history- Reviewed and unchanged Social History   Tobacco Use  . Smoking status: Never Smoker  . Smokeless tobacco: Never Used  Substance Use Topics  . Alcohol use: No    Alcohol/week: 0.0 standard drinks  . Drug use: Not on file     Names of Other Physician/Practitioners you currently use: 1. [facility] here for primary care 2. Eye Exam: Due for 2021 3. Dental Exam Due for 2021   Patient Care Team: [name], MD as PCP - General (Internal Medicine) Monna Fam, MD as Consulting Physician (Ophthalmology) Druscilla Brownie, MD as Consulting Physician (Dermatology)    Screening Tests: Immunization History  Administered Date(s) Administered  . DT (Pediatric) 07/31/2015  . DTaP 06/09/2005  . Influenza, High Dose Seasonal PF 04/13/2017  . Influenza-Unspecified 04/09/2014, 05/10/2015,  04/22/2016  . Pneumococcal Conjugate-13 07/01/2014  . Pneumococcal-Unspecified 06/10/1999, 06/09/2008  . Zoster 06/10/2007      Review of Systems:  Review of Systems  Constitutional: Negative for chills, diaphoresis, fever, malaise/fatigue and weight loss.  HENT: Negative for congestion, ear discharge, ear pain, hearing loss, nosebleeds, sinus pain, sore throat and tinnitus.   Eyes: Negative for blurred vision, double vision, photophobia, pain, discharge and redness.  Respiratory: Negative for cough, hemoptysis, sputum production, shortness of breath, wheezing and stridor.   Cardiovascular: Negative for chest pain, palpitations, orthopnea, claudication, leg swelling and PND.  Gastrointestinal: Negative for abdominal pain, blood in stool, constipation, diarrhea, heartburn, melena, nausea and vomiting.  Genitourinary: Negative for dysuria, flank pain, frequency, hematuria and urgency.  Musculoskeletal: Negative for back pain, falls, joint pain, myalgias and neck pain.  Skin: Negative for itching and rash.  Neurological: Negative for dizziness, tingling, tremors, sensory change, speech change, focal weakness, seizures, loss of consciousness, weakness and headaches.  Endo/Heme/Allergies: Negative for environmental allergies and polydipsia. Does not bruise/bleed easily.  Psychiatric/Behavioral: Negative for depression, hallucinations, memory loss, substance abuse and suicidal  ideas. The patient is not nervous/anxious and does not have insomnia.       Physical Exam: BP 120/74   Pulse 72   Temp (!) 97 F (36.1 C)   Resp 18   Ht 5\' 2"  (1.575 m)   Wt 182 lb 9.6 oz (82.8 kg)   BMI 33.40 kg/m  Wt Readings from Last 3 Encounters:  08/30/19 182 lb 9.6 oz (82.8 kg)  05/03/19 193 lb (87.5 kg)  01/26/19 190 lb (86.2 kg)   General Appearance: Well nourished, in no apparent distress. Eyes: PERRLA, EOMs, conjunctiva no swelling or erythema Sinuses: No Frontal/maxillary tenderness ENT/Mouth:  Ext aud canals clear, TMs without erythema, bulging. No erythema, swelling, or exudate on post pharynx.  Tonsils not swollen or erythematous. Hearing normal.  Neck: Supple, thyroid normal.  Respiratory: Respiratory effort normal, BS equal bilaterally without rales, rhonchi, wheezing or stridor.  Cardio: RRR with no MRGs. Brisk peripheral pulses without edema.  Abdomen: Soft, + BS.  Non tender, no guarding, rebound, hernias, masses. Lymphatics: Non tender without lymphadenopathy.  Musculoskeletal: Full ROM, 5/5 strength, Normal gait Skin: Warm, dry without rashes, lesions, ecchymosis.  Neuro: Cranial nerves intact. No cerebellar symptoms.  Psych: Awake and oriented X 3, normal affect, Insight and Judgment appropriate.    Garnet Sierras, NP [facility] 11:31 AM

## 2019-08-30 ENCOUNTER — Encounter: Payer: Self-pay | Admitting: Adult Health Nurse Practitioner

## 2019-08-30 ENCOUNTER — Ambulatory Visit (INDEPENDENT_AMBULATORY_CARE_PROVIDER_SITE_OTHER): Payer: Medicare Other | Admitting: Adult Health Nurse Practitioner

## 2019-08-30 ENCOUNTER — Other Ambulatory Visit: Payer: Self-pay

## 2019-08-30 VITALS — BP 120/74 | HR 72 | Temp 97.0°F | Resp 18 | Ht 62.0 in | Wt 182.6 lb

## 2019-08-30 DIAGNOSIS — Z79899 Other long term (current) drug therapy: Secondary | ICD-10-CM

## 2019-08-30 DIAGNOSIS — I10 Essential (primary) hypertension: Secondary | ICD-10-CM | POA: Diagnosis not present

## 2019-08-30 DIAGNOSIS — E559 Vitamin D deficiency, unspecified: Secondary | ICD-10-CM | POA: Diagnosis not present

## 2019-08-30 DIAGNOSIS — R7309 Other abnormal glucose: Secondary | ICD-10-CM

## 2019-08-30 DIAGNOSIS — M159 Polyosteoarthritis, unspecified: Secondary | ICD-10-CM

## 2019-08-30 DIAGNOSIS — E782 Mixed hyperlipidemia: Secondary | ICD-10-CM | POA: Diagnosis not present

## 2019-08-30 DIAGNOSIS — Z638 Other specified problems related to primary support group: Secondary | ICD-10-CM

## 2019-08-30 NOTE — Patient Instructions (Addendum)
We will contact you in 1-3 days via MyChart with your lab results.  For you ears, try Zyrtec (cetirazine) at night OR Allegra OR Claritin.  This will help to dry up the fluid.  It will also cause dry mouth.  Increase your water intake, at least 64 oz of water a day. At least 5-6, 12oz glasses.    Vit D  & Vit C 1,000 mg   are recommended to help protect  against the Covid-19 and other Corona viruses.    Also it's recommended  to take  Zinc 50 mg  to help  protect against the Covid-19   and best place to get  is also on [email or web address]  and don't pay more than 6-8 cents /pill !  ================================ Coronavirus (COVID-19) Are you at risk?  Are you at risk for the Coronavirus (COVID-19)?  To be considered HIGH RISK for Coronavirus (COVID-19), you have to meet the following criteria:  . Traveled to [place], [place], [place], [place] or [place]; or in the [place] to [place], [place], [place]  . or [place]; and have fever, cough, and shortness of breath within the last 2 weeks of travel OR . Been in close contact with a person diagnosed with COVID-19 within the last 2 weeks and have  . fever, cough,and shortness of breath .  . IF YOU DO NOT MEET THESE CRITERIA, YOU ARE CONSIDERED LOW RISK FOR COVID-19.  What to do if you are HIGH RISK for COVID-19?  Marland Kitchen If you are having a medical emergency, call 911. . Seek medical care right away. Before you go to a doctor's office, urgent care or emergency department, .  call ahead and tell them about your recent travel, contact with someone diagnosed with COVID-19  .  and your symptoms.  . You should receive instructions from your physician's office regarding next steps of care.  . When you arrive at healthcare provider, tell the healthcare staff immediately you have returned from  . visiting [place], [place], [place], [place] or [place]; or traveled in the [place] to [place], [place],  . [place] or [place] in  the last two weeks or you have been in close contact with a person diagnosed with  . COVID-19 in the last 2 weeks.   . Tell the health care staff about your symptoms: fever, cough and shortness of breath. . After you have been seen by a medical provider, you will be either: o Tested for (COVID-19) and discharged home on quarantine except to seek medical care if  o symptoms worsen, and asked to  - Stay home and avoid contact with others until you get your results (4-5 days)  - Avoid travel on public transportation if possible (such as bus, train, or airplane) or o Sent to the Emergency Department by EMS for evaluation, COVID-19 testing  and  o possible admission depending on your condition and test results.  What to do if you are LOW RISK for COVID-19?  Reduce your risk of any infection by using the same precautions used for avoiding the common cold or flu:  Marland Kitchen Wash your hands often with soap and warm water for at least 20 seconds.  If soap and water are not readily available,  . use an alcohol-based hand sanitizer with at least 60% alcohol.  . If coughing or sneezing, cover your mouth and nose by coughing or sneezing into the elbow areas of your shirt or coat, .  into  a tissue or into your sleeve (not your hands). . Avoid shaking hands with others and consider head nods or verbal greetings only. . Avoid touching your eyes, nose, or mouth with unwashed hands.  . Avoid close contact with people who are sick. . Avoid places or events with large numbers of people in one location, like concerts or sporting events. . Carefully consider travel plans you have or are making. . If you are planning any travel outside or inside the [place], visit the CDC's Travelers' Health webpage for the latest health notices. . If you have some symptoms but not all symptoms, continue to monitor at home and seek medical attention  . if your symptoms worsen. . If you are having a medical emergency, call  911.   . >>>>>>>>>>>>>>>>>>>>>>>>>>>>>>>>> . We Do NOT Approve of  [facility], [employer]  Our Patients  To Do Home Visits & We Do NOT Approve of [employer] > > > > > > > > > > > > > > > > > > > > > > > > > > > > > > > > > > > > > > >  Preventive Care for Adults  A healthy lifestyle and preventive care can promote health and wellness. Preventive health guidelines for women include the following key practices.  A routine yearly physical is a good way to check with your health care provider about your health and preventive screening. It is a chance to share any concerns and updates on your health and to receive a thorough exam.  Visit your dentist for a routine exam and preventive care every 6 months. Brush your teeth twice a day and floss once a day. Good oral hygiene prevents tooth decay and gum disease.  The frequency of eye exams is based on your age, health, family medical history, use of contact lenses, and other factors. Follow your health care provider's recommendations for frequency of eye exams.  Eat a healthy diet. Foods like vegetables, fruits, whole grains, low-fat dairy products, and lean protein foods contain the nutrients you need without too many calories. Decrease your intake of foods high in solid fats, added sugars, and salt. Eat the right amount of calories for you. Get information about a proper diet from your health care provider, if necessary.  Regular physical exercise is one of the most important things you can do for your health. Most adults should get at least 150 minutes of moderate-intensity exercise (any activity that increases your heart rate and causes you to sweat) each week. In addition, most adults need muscle-strengthening exercises on 2 or more days a week.  Maintain a healthy weight. The body mass index (BMI) is a screening tool to identify possible weight problems. It provides an estimate of body fat based on height and  weight. Your health care provider can find your BMI and can help you achieve or maintain a healthy weight. For adults 20 years and older:  A BMI below 18.5 is considered underweight.  A BMI of 18.5 to 24.9 is normal.  A BMI of 25 to 29.9 is considered overweight.  A BMI of 30 and above is considered obese.  Maintain normal blood lipids and cholesterol levels by exercising and minimizing your intake of saturated fat. Eat a balanced diet with plenty of fruit and vegetables. If your lipid or cholesterol levels are high, you are over 50, or you are at high risk for heart disease, you may need your cholesterol levels checked  more frequently. Ongoing high lipid and cholesterol levels should be treated with medicines if diet and exercise are not working.  If you smoke, find out from your health care provider how to quit. If you do not use tobacco, do not start.  Lung cancer screening is recommended for adults aged 75-80 years who are at high risk for developing lung cancer because of a history of smoking. A yearly low-dose CT scan of the lungs is recommended for people who have at least a 30-pack-year history of smoking and are a current smoker or have quit within the past 15 years. A pack year of smoking is smoking an average of 1 pack of cigarettes a day for 1 year (for example: 1 pack a day for 30 years or 2 packs a day for 15 years). Yearly screening should continue until the smoker has stopped smoking for at least 15 years. Yearly screening should be stopped for people who develop a health problem that would prevent them from having lung cancer treatment.  Avoid use of street drugs. Do not share needles with anyone. Ask for help if you need support or instructions about stopping the use of drugs.  High blood pressure causes heart disease and increases the risk of stroke.  Ongoing high blood pressure should be treated with medicines if weight loss and exercise do not work.  If you are 9-79 years  old, ask your health care provider if you should take aspirin to prevent strokes.  Diabetes screening involves taking a blood sample to check your fasting blood sugar level. This should be done once every 3 years, after age 21, if you are within normal weight and without risk factors for diabetes. Testing should be considered at a younger age or be carried out more frequently if you are overweight and have at least 1 risk factor for diabetes.  Breast cancer screening is essential preventive care for women. You should practice "breast self-awareness." This means understanding the normal appearance and feel of your breasts and may include breast self-examination. Any changes detected, no matter how small, should be reported to a health care provider. Women in their 10s and 30s should have a clinical breast exam (CBE) by a health care provider as part of a regular health exam every 1 to 3 years. After age 31, women should have a CBE every year. Starting at age 87, women should consider having a mammogram (breast X-ray test) every year. Women who have a family history of breast cancer should talk to their health care provider about genetic screening. Women at a high risk of breast cancer should talk to their health care providers about having an MRI and a mammogram every year.  Breast cancer gene (BRCA)-related cancer risk assessment is recommended for women who have family members with BRCA-related cancers. BRCA-related cancers include breast, ovarian, tubal, and peritoneal cancers. Having family members with these cancers may be associated with an increased risk for harmful changes (mutations) in the breast cancer genes BRCA1 and BRCA2. Results of the assessment will determine the need for genetic counseling and BRCA1 and BRCA2 testing.  Routine pelvic exams to screen for cancer are no longer recommended for nonpregnant women who are considered low risk for cancer of the pelvic organs (ovaries, uterus, and  vagina) and who do not have symptoms. Ask your health care provider if a screening pelvic exam is right for you.  If you have had past treatment for cervical cancer or a condition that could lead to  cancer, you need Pap tests and screening for cancer for at least 20 years after your treatment. If Pap tests have been discontinued, your risk factors (such as having a new sexual partner) need to be reassessed to determine if screening should be resumed. Some women have medical problems that increase the chance of getting cervical cancer. In these cases, your health care provider may recommend more frequent screening and Pap tests.    Colorectal cancer can be detected and often prevented. Most routine colorectal cancer screening begins at the age of 73 years and continues through age 22 years. However, your health care provider may recommend screening at an earlier age if you have risk factors for colon cancer. On a yearly basis, your health care provider may provide home test kits to check for hidden blood in the stool. Use of a small camera at the end of a tube, to directly examine the colon (sigmoidoscopy or colonoscopy), can detect the earliest forms of colorectal cancer. Talk to your health care provider about this at age 65, when routine screening begins.  Direct exam of the colon should be repeated every 5-10 years through age 54 years, unless early forms of pre-cancerous polyps or small growths are found.  Osteoporosis is a disease in which the bones lose minerals and strength with aging. This can result in serious bone fractures or breaks. The risk of osteoporosis can be identified using a bone density scan. Women ages 42 years and over and women at risk for fractures or osteoporosis should discuss screening with their health care providers. Ask your health care provider whether you should take a calcium supplement or vitamin D to reduce the rate of osteoporosis.  Menopause can be associated with  physical symptoms and risks. Hormone replacement therapy is available to decrease symptoms and risks. You should talk to your health care provider about whether hormone replacement therapy is right for you.  Use sunscreen. Apply sunscreen liberally and repeatedly throughout the day. You should seek shade when your shadow is shorter than you. Protect yourself by wearing long sleeves, pants, a wide-brimmed hat, and sunglasses year round, whenever you are outdoors.  Once a month, do a whole body skin exam, using a mirror to look at the skin on your back. Tell your health care provider of new moles, moles that have irregular borders, moles that are larger than a pencil eraser, or moles that have changed in shape or color.  Stay current with required vaccines (immunizations).  Influenza vaccine. All adults should be immunized every year.  Tetanus, diphtheria, and acellular pertussis (Td, Tdap) vaccine. Pregnant women should receive 1 dose of Tdap vaccine during each pregnancy. The dose should be obtained regardless of the length of time since the last dose. Immunization is preferred during the 27th-36th week of gestation. An adult who has not previously received Tdap or who does not know her vaccine status should receive 1 dose of Tdap. This initial dose should be followed by tetanus and diphtheria toxoids (Td) booster doses every 10 years. Adults with an unknown or incomplete history of completing a 3-dose immunization series with Td-containing vaccines should begin or complete a primary immunization series including a Tdap dose. Adults should receive a Td booster every 10 years.    Zoster vaccine. One dose is recommended for adults aged 46 years or older unless certain conditions are present.    Pneumococcal 13-valent conjugate (PCV13) vaccine. When indicated, a person who is uncertain of her immunization history and has no  record of immunization should receive the PCV13 vaccine. An adult aged 43  years or older who has certain medical conditions and has not been previously immunized should receive 1 dose of PCV13 vaccine. This PCV13 should be followed with a dose of pneumococcal polysaccharide (PPSV23) vaccine. The PPSV23 vaccine dose should be obtained at least 1 or more year(s) after the dose of PCV13 vaccine. An adult aged 27 years or older who has certain medical conditions and previously received 1 or more doses of PPSV23 vaccine should receive 1 dose of PCV13. The PCV13 vaccine dose should be obtained 1 or more years after the last PPSV23 vaccine dose.    Pneumococcal polysaccharide (PPSV23) vaccine. When PCV13 is also indicated, PCV13 should be obtained first. All adults aged 28 years and older should be immunized. An adult younger than age 64 years who has certain medical conditions should be immunized. Any person who resides in a nursing home or long-term care facility should be immunized. An adult smoker should be immunized. People with an immunocompromised condition and certain other conditions should receive both PCV13 and PPSV23 vaccines. People with human immunodeficiency virus (HIV) infection should be immunized as soon as possible after diagnosis. Immunization during chemotherapy or radiation therapy should be avoided. Routine use of PPSV23 vaccine is not recommended for American Indians, [place], or people younger than 65 years unless there are medical conditions that require PPSV23 vaccine. When indicated, people who have unknown immunization and have no record of immunization should receive PPSV23 vaccine. One-time revaccination 5 years after the first dose of PPSV23 is recommended for people aged 19-64 years who have chronic kidney failure, nephrotic syndrome, asplenia, or immunocompromised conditions. People who received 1-2 doses of PPSV23 before age 66 years should receive another dose of PPSV23 vaccine at age 41 years or later if at least 5 years have passed since the  previous dose. Doses of PPSV23 are not needed for people immunized with PPSV23 at or after age 19 years.   Preventive Services / Frequency  Ages 43 years and over  Blood pressure check.  Lipid and cholesterol check.  Lung cancer screening. / Every year if you are aged 56-80 years and have a 30-pack-year history of smoking and currently smoke or have quit within the past 15 years. Yearly screening is stopped once you have quit smoking for at least 15 years or develop a health problem that would prevent you from having lung cancer treatment.  Clinical breast exam.** / Every year after age 18 years.   BRCA-related cancer risk assessment.** / For women who have family members with a BRCA-related cancer (breast, ovarian, tubal, or peritoneal cancers).  Mammogram.** / Every year beginning at age 62 years and continuing for as long as you are in good health. Consult with your health care provider.  Pap test.** / Every 3 years starting at age 39 years through age 68 or 62 years with 3 consecutive normal Pap tests. Testing can be stopped between 65 and 70 years with 3 consecutive normal Pap tests and no abnormal Pap or HPV tests in the past 10 years.  Fecal occult blood test (FOBT) of stool. / Every year beginning at age 62 years and continuing until age 48 years. You may not need to do this test if you get a colonoscopy every 10 years.  Flexible sigmoidoscopy or colonoscopy.** / Every 5 years for a flexible sigmoidoscopy or every 10 years for a colonoscopy beginning at age 83 years and continuing until age  75 years.  Hepatitis C blood test.** / For all people born from [birth year] through 1967 and any individual with known risks for hepatitis C.  Osteoporosis screening.** / A one-time screening for women ages 2 years and over and women at risk for fractures or osteoporosis.  Skin self-exam. / Monthly.  Influenza vaccine. / Every year.  Tetanus, diphtheria, and acellular pertussis (Tdap/Td)  vaccine.** / 1 dose of Td every 10 years.  Zoster vaccine.** / 1 dose for adults aged 61 years or older.  Pneumococcal 13-valent conjugate (PCV13) vaccine.** / Consult your health care provider.  Pneumococcal polysaccharide (PPSV23) vaccine.** / 1 dose for all adults aged 63 years and older. Screening for abdominal aortic aneurysm (AAA)  by ultrasound is recommended for people who have history of high blood pressure or who are current or former smokers. ++++++++++++++++++++ Recommend Adult Low Dose Aspirin or  coated  Aspirin 81 mg daily  To reduce risk of Colon Cancer 40 %,  Skin Cancer 26 % ,  Melanoma 46%  and  Pancreatic cancer 60% ++++++++++++++++++++ Vitamin D goal  is between 70-100.  Please make sure that you are taking your Vitamin D as directed.  It is very important as a natural anti-inflammatory  helping hair, skin, and nails, as well as reducing stroke and heart attack risk.  It helps your bones and helps with mood. It also decreases numerous cancer risks so please take it as directed.  Low Vit D is associated with a 200-300% higher risk for CANCER  and 200-300% higher risk for HEART   ATTACK  &  STROKE.   .....................................Marland Kitchen It is also associated with higher death rate at younger ages,  autoimmune diseases like Rheumatoid arthritis, Lupus, Multiple Sclerosis.    Also many other serious conditions, like depression, Alzheimer's Dementia, infertility, muscle aches, fatigue, fibromyalgia - just to name a few. ++++++++++++++++++ Recommend the book "The END of DIETING" by Dr Excell Seltzer  & the book "The END of DIABETES " by Dr Excell Seltzer At [facility].[email or web address] - get book & Audio CD's    Being diabetic has a  300% increased risk for heart attack, stroke, cancer, and alzheimer- type vascular dementia. It is very important that you work harder with diet by avoiding all foods that are white. Avoid white rice (brown & wild rice is OK), white potatoes (sweetpotatoes  in moderation is OK), White bread or wheat bread or anything made out of white flour like bagels, donuts, rolls, buns, biscuits, cakes, pastries, cookies, pizza crust, and pasta (made from white flour & egg whites) - vegetarian pasta or spinach or wheat pasta is OK. Multigrain breads like Arnold's or [employer], or multigrain sandwich thins or flatbreads.  Diet, exercise and weight loss can reverse and cure diabetes in the early stages.  Diet, exercise and weight loss is very important in the control and prevention of complications of diabetes which affects every system in your body, ie. Brain - dementia/stroke, eyes - glaucoma/blindness, heart - heart attack/heart failure, kidneys - dialysis, stomach - gastric paralysis, intestines - malabsorption, nerves - severe painful neuritis, circulation - gangrene & loss of a leg(s), and finally cancer and Alzheimers.    I recommend avoid fried & greasy foods,  sweets/candy, white rice (brown or wild rice or Quinoa is OK), white potatoes (sweet potatoes are OK) - anything made from white flour - bagels, doughnuts, rolls, buns, biscuits,white and wheat breads, pizza crust and traditional pasta made of white flour & egg white(vegetarian pasta or  spinach or wheat pasta is OK).  Multi-grain bread is OK - like multi-grain flat bread or sandwich thins. Avoid alcohol in excess. Exercise is also important.    Eat all the vegetables you want - avoid meat, especially red meat and dairy - especially cheese.  Cheese is the most concentrated form of trans-fats which is the worst thing to clog up our arteries. Veggie cheese is OK which can be found in the fresh produce section at [employer] or [employer] or [employer]  +++++++++++++++++++ DASH Eating Plan  DASH stands for "Dietary Approaches to Stop Hypertension."   The DASH eating plan is a healthy eating plan that has been shown to reduce high blood pressure (hypertension). Additional health benefits may include  reducing the risk of type 2 diabetes mellitus, heart disease, and stroke. The DASH eating plan may also help with weight loss. WHAT DO I NEED TO KNOW ABOUT THE DASH EATING PLAN? For the DASH eating plan, you will follow these general guidelines:  Choose foods with a percent daily value for sodium of less than 5% (as listed on the food label).  Use salt-free seasonings or herbs instead of table salt or sea salt.  Check with your health care provider or pharmacist before using salt substitutes.  Eat lower-sodium products, often labeled as "lower sodium" or "no salt added."  Eat fresh foods.  Eat more vegetables, fruits, and low-fat dairy products.  Choose whole grains. Look for the word "whole" as the first word in the ingredient list.  Choose fish   Limit sweets, desserts, sugars, and sugary drinks.  Choose heart-healthy fats.  Eat veggie cheese   Eat more home-cooked food and less restaurant, buffet, and fast food.  Limit fried foods.  Cook foods using methods other than frying.  Limit canned vegetables. If you do use them, rinse them well to decrease the sodium.  When eating at a restaurant, ask that your food be prepared with less salt, or no salt if possible.                      WHAT FOODS CAN I EAT? Read Dr Fara Olden Fuhrman's books on The End of Dieting & The End of Diabetes  Grains Whole grain or whole wheat bread. Brown rice. Whole grain or whole wheat pasta. Quinoa, bulgur, and whole grain cereals. Low-sodium cereals. Corn or whole wheat flour tortillas. Whole grain cornbread. Whole grain crackers. Low-sodium crackers.  Vegetables Fresh or frozen vegetables (raw, steamed, roasted, or grilled). Low-sodium or reduced-sodium tomato and vegetable juices. Low-sodium or reduced-sodium tomato sauce and paste. Low-sodium or reduced-sodium canned vegetables.   Fruits All fresh, canned (in natural juice), or frozen fruits.  Protein Products  All fish and seafood.  Dried  beans, peas, or lentils. Unsalted nuts and seeds. Unsalted canned beans.  Dairy Low-fat dairy products, such as skim or 1% milk, 2% or reduced-fat cheeses, low-fat ricotta or cottage cheese, or plain low-fat yogurt. Low-sodium or reduced-sodium cheeses.  Fats and Oils Tub margarines without trans fats. Light or reduced-fat mayonnaise and salad dressings (reduced sodium). Avocado. Safflower, olive, or canola oils. Natural peanut or almond butter.  Other Unsalted popcorn and pretzels. The items listed above may not be a complete list of recommended foods or beverages. Contact your dietitian for more options.  +++++++++++++++  WHAT FOODS ARE NOT RECOMMENDED? Grains/ White flour or wheat flour White bread. White pasta. White rice. Refined cornbread. Bagels and croissants. Crackers that contain trans fat.  Vegetables  Creamed or fried vegetables. Vegetables in a . Regular canned vegetables. Regular canned tomato sauce and paste. Regular tomato and vegetable juices.  Fruits Dried fruits. Canned fruit in light or heavy syrup. Fruit juice.  Meat and Other Protein Products Meat in general - RED meat & White meat.  Fatty cuts of meat. Ribs, chicken wings, all processed meats as bacon, sausage, bologna, salami, fatback, hot dogs, bratwurst and packaged luncheon meats.  Dairy Whole or 2% milk, cream, half-and-half, and cream cheese. Whole-fat or sweetened yogurt. Full-fat cheeses or blue cheese. Non-dairy creamers and whipped toppings. Processed cheese, cheese spreads, or cheese curds.  Condiments Onion and garlic salt, seasoned salt, table salt, and sea salt. Canned and packaged gravies. Worcestershire sauce. Tartar sauce. Barbecue sauce. Teriyaki sauce. Soy sauce, including reduced sodium. Steak sauce. Fish sauce. Oyster sauce. Cocktail sauce. Horseradish. Ketchup and mustard. Meat flavorings and tenderizers. Bouillon cubes. Hot sauce. Tabasco sauce. Marinades. Taco seasonings.  Relishes.  Fats and Oils Butter, stick margarine, lard, shortening and bacon fat. Coconut, palm kernel, or palm oils. Regular salad dressings.  Pickles and olives. Salted popcorn and pretzels.  The items listed above may not be a complete list of foods and beverages to avoid.

## 2019-08-31 LAB — COMPLETE METABOLIC PANEL WITH GFR
AG Ratio: 1.6 (calc) (ref 1.0–2.5)
ALT: 21 U/L (ref 6–29)
AST: 25 U/L (ref 10–35)
Albumin: 4.6 g/dL (ref 3.6–5.1)
Alkaline phosphatase (APISO): 64 U/L (ref 37–153)
BUN/Creatinine Ratio: 28 (calc) — ABNORMAL HIGH (ref 6–22)
BUN: 25 mg/dL (ref 7–25)
CO2: 26 mmol/L (ref 20–32)
Calcium: 10.2 mg/dL (ref 8.6–10.4)
Chloride: 105 mmol/L (ref 98–110)
Creat: 0.9 mg/dL — ABNORMAL HIGH (ref 0.60–0.88)
GFR, Est African American: 70 mL/min/{1.73_m2} (ref >=60)
GFR, Est Non African American: 60 mL/min/{1.73_m2} (ref >=60)
Globulin: 2.8 g/dL (calc) (ref 1.9–3.7)
Glucose, Bld: 96 mg/dL (ref 65–99)
Potassium: 4.8 mmol/L (ref 3.5–5.3)
Sodium: 138 mmol/L (ref 135–146)
Total Bilirubin: 0.4 mg/dL (ref 0.2–1.2)
Total Protein: 7.4 g/dL (ref 6.1–8.1)

## 2019-08-31 LAB — TSH: TSH: 1.77 mIU/L (ref 0.40–4.50)

## 2019-08-31 LAB — CBC WITH DIFFERENTIAL/PLATELET
Absolute Monocytes: 581 cells/uL (ref 200–950)
Basophils Absolute: 53 {cells}/uL (ref 0–200)
Basophils Relative: 0.8 %
Eosinophils Absolute: 79 cells/uL (ref 15–500)
Eosinophils Relative: 1.2 %
HCT: 42.7 % (ref 35.0–45.0)
Hemoglobin: 14.3 g/dL (ref 11.7–15.5)
Lymphs Abs: 2317 cells/uL (ref 850–3900)
MCH: 30.4 pg (ref 27.0–33.0)
MCHC: 33.5 g/dL (ref 32.0–36.0)
MCV: 90.9 fL (ref 80.0–100.0)
MPV: 10.6 fL (ref 7.5–12.5)
Monocytes Relative: 8.8 %
Neutro Abs: 3571 cells/uL (ref 1500–7800)
Neutrophils Relative %: 54.1 %
Platelets: 278 10*3/uL (ref 140–400)
RBC: 4.7 10*6/uL (ref 3.80–5.10)
RDW: 13.7 % (ref 11.0–15.0)
Total Lymphocyte: 35.1 %
WBC: 6.6 10*3/uL (ref 3.8–10.8)

## 2019-08-31 LAB — HEMOGLOBIN A1C
Hgb A1c MFr Bld: 5.7 % of total Hgb — ABNORMAL HIGH (ref <5.7)
Mean Plasma Glucose: 117 (calc)
eAG (mmol/L): 6.5 (calc)

## 2019-08-31 LAB — LIPID PANEL
Cholesterol: 164 mg/dL (ref <200)
HDL: 56 mg/dL (ref >=50)
LDL Cholesterol (Calc): 93 mg/dL (calc)
Non-HDL Cholesterol (Calc): 108 mg/dL (calc) (ref <130)
Total CHOL/HDL Ratio: 2.9 (calc) (ref <5.0)
Triglycerides: 66 mg/dL (ref <150)

## 2019-08-31 LAB — VITAMIN D 25 HYDROXY (VIT D DEFICIENCY, FRACTURES): Vit D, 25-Hydroxy: 49 ng/mL (ref 30–100)

## 2019-08-31 LAB — MAGNESIUM: Magnesium: 2.3 mg/dL (ref 1.5–2.5)

## 2019-11-07 ENCOUNTER — Encounter: Payer: Medicare Other | Admitting: Internal Medicine

## 2019-12-03 ENCOUNTER — Encounter: Payer: Self-pay | Admitting: Internal Medicine

## 2019-12-03 NOTE — Progress Notes (Signed)
Annual Screening/Preventative Visit & Comprehensive Evaluation &  Examination     This very nice 79 y.o. [name]  presents for a Screening /Preventative Visit & comprehensive evaluation and management of multiple medical co-morbidities.  Patient has been followed for HTN, HLD, Prediabetes  and Vitamin D Deficiency.  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      HTN predates since 2016. Patient's BP has been controlled at home and patient denies any cardiac symptoms as chest pain, palpitations, shortness of breath, dizziness or ankle swelling. Today's BP is at goal - 116/64.      Patient's hyperlipidemia is controlled with diet. Last lipids were at goal:  Lab Results  Component Value Date   CHOL 164 08/30/2019   HDL 56 08/30/2019   LDLCALC 93 08/30/2019   TRIG 66 08/30/2019   CHOLHDL 2.9 08/30/2019       Patient has Morbid Obesity (BMI 35+) and hx/o prediabetes(A1c 5.9% / 2016) and patient denies reactive hypoglycemic symptoms, visual blurring, diabetic polys or paresthesias. Last A1c was near goal:  Lab Results  Component Value Date   HGBA1C 5.7 (H) 08/30/2019       Finally, patient has history of Vitamin D Deficiency ("33"/ 2016)  and last Vitamin D was sl low (goal 70-100):  Lab Results  Component Value Date   VD25OH 49 08/30/2019    Current Outpatient Medications on File Prior to Visit  Medication Sig  . aspirin EC 81 MG tablet Take 81 mg by mouth daily. Reported on 07/31/2015  . Cholecalciferol (VITAMIN D PO) Take 10,000 Units by mouth daily.   . naproxen sodium (ANAPROX) 220 MG tablet Take 220 mg by mouth 2 (two) times daily with a meal. PRN  . telmisartan (MICARDIS) 80 MG tablet Take 1 tablet Daily for BP  . topiramate (TOPAMAX) 50 MG tablet Take 1/2 to 1 tablet 2 x /day at Suppertime & Bedtime for Dieting & Weight Loss   No current facility-administered medications on file prior to visit.   Allergies  Allergen Reactions  .  Codeine Nausea And Vomiting  . Hydrocodone Nausea And Vomiting  . Lisinopril Cough    cough  . Omeprazole Nausea And Vomiting   Past Medical History:  Diagnosis Date  . Elevated BP   . Prediabetes   . Vitamin D deficiency    Health Maintenance  Topic Date Due  . COVID-19 Vaccine (1) Never done  . INFLUENZA VACCINE  03/09/2020  . TETANUS/TDAP  07/30/2025  . DEXA SCAN  Completed  . PNA vac Low Risk Adult  Completed   Immunization History  Administered Date(s) Administered  . DT (Pediatric) 07/31/2015  . DTaP 06/09/2005  . Influenza, High Dose Seasonal PF 04/13/2017  . Influenza-Unspecified 04/09/2014, 05/10/2015, 04/22/2016  . Pneumococcal Conjugate-13 07/01/2014  . Pneumococcal-Unspecified 06/10/1999, 06/09/2008  . Zoster 06/10/2007    Last Colon -  Negative Cologard  08/20/2015  Last MGM - 02/01/2019  History reviewed. No pertinent surgical history.   Family History  Problem Relation Age of Onset  . Breast cancer Mother    Social History   Tobacco Use  . Smoking status: Never Smoker  . Smokeless tobacco: Never Used  Substance Use Topics  . Alcohol use: No    Alcohol/week: 0.0 standard drinks  . Drug use: Not on file    ROS Constitutional: Denies fever, chills, weight loss/gain, headaches, insomnia,  night sweats, and change in appetite. Does c/o fatigue. Eyes: Denies redness, blurred vision, diplopia, discharge,  itchy, watery eyes.  ENT: Denies discharge, congestion, post nasal drip, epistaxis, sore throat, earache, hearing loss, dental pain, Tinnitus, Vertigo, Sinus pain, snoring.  Cardio: Denies chest pain, palpitations, irregular heartbeat, syncope, dyspnea, diaphoresis, orthopnea, PND, claudication, edema Respiratory: denies cough, dyspnea, DOE, pleurisy, hoarseness, laryngitis, wheezing.  Gastrointestinal: Denies dysphagia, heartburn, reflux, water brash, pain, cramps, nausea, vomiting, bloating, diarrhea, constipation, hematemesis, melena, hematochezia,  jaundice, hemorrhoids Genitourinary: Denies dysuria, frequency, urgency, nocturia, hesitancy, discharge, hematuria, flank pain Breast: Breast lumps, nipple discharge, bleeding.  Musculoskeletal: Denies arthralgia, myalgia, stiffness, Jt. Swelling, pain, limp, and strain/sprain. Denies falls. Skin: Denies puritis, rash, hives, warts, acne, eczema, changing in skin lesion Neuro: No weakness, tremor, incoordination, spasms, paresthesia, pain Psychiatric: Denies confusion, memory loss, sensory loss. Denies Depression. Endocrine: Denies change in weight, skin, hair change, nocturia, and paresthesia, diabetic polys, visual blurring, hyper / hypo glycemic episodes.  Heme/Lymph: No excessive bleeding, bruising, enlarged lymph nodes.  Physical Exam  BP 116/64   Pulse 64   Temp 97.6 F (36.4 C)   Resp 16   Ht 5\' 2"  (1.575 m)   Wt 180 lb (81.6 kg)   BMI 32.92 kg/m   General Appearance: Well nourished, well groomed and in no apparent distress.  Eyes: PERRLA, EOMs, conjunctiva no swelling or erythema, normal fundi and vessels. Sinuses: No frontal/maxillary tenderness ENT/Mouth: EACs patent / TMs  nl. Nares clear without erythema, swelling, mucoid exudates. Oral hygiene is good. No erythema, swelling, or exudate. Tongue normal, non-obstructing. Tonsils not swollen or erythematous. Hearing normal.  Neck: Supple, thyroid not palpable. No bruits, nodes or JVD. Respiratory: Respiratory effort normal.  BS equal and clear bilateral without rales, rhonci, wheezing or stridor. Cardio: Heart sounds are normal with regular rate and rhythm and no murmurs, rubs or gallops. Peripheral pulses are normal and equal bilaterally without edema. No aortic or femoral bruits. Chest: symmetric with normal excursions and percussion. Breasts: Symmetric, without lumps, nipple discharge, retractions, or fibrocystic changes.  Abdomen: Flat, soft with bowel sounds active. Nontender, no guarding, rebound, hernias, masses, or  organomegaly.  Lymphatics: Non tender without lymphadenopathy.  Genitourinary:  Musculoskeletal: Full ROM all peripheral extremities, joint stability, 5/5 strength, and normal gait. Skin: Warm and dry without rashes, lesions, cyanosis, clubbing or  ecchymosis.  Neuro: Cranial nerves intact, reflexes equal bilaterally. Normal muscle tone, no cerebellar symptoms. Sensation intact.  Pysch: Alert and oriented X 3, normal affect, Insight and Judgment appropriate.   Assessment and Plan  1. Annual Preventative Screening Examination  2. Labile hypertension  - EKG 12-Lead - Urinalysis, Routine w reflex microscopic - Microalbumin / creatinine urine ratio - CBC with Differential/Platelet - COMPLETE METABOLIC PANEL WITH GFR - Magnesium - TSH  3. Hyperlipidemia, mixed  - EKG 12-Lead - Lipid panel - TSH  4. Abnormal glucose  - EKG 12-Lead - Hemoglobin A1c - Insulin, random  5. Vitamin D deficiency  - VITAMIN D 25 Hydroxy  6. Prediabetes  - EKG 12-Lead - Hemoglobin A1c - Insulin, random  7. Osteoarthritis of multiple joints  8. Class 2 severe obesity due to excess calories with serious comorbidity  and body mass index (BMI) of 35.0 to 35.9 in adult ([place])  9. Screening for colorectal cancer  - POC Hemoccult Bld/Stl   10. Screening for ischemic heart disease  - EKG 12-Lead  11. FH: hypertension  - EKG 12-Lead  12. Medication management  - Urinalysis, Routine w reflex microscopic - Microalbumin / creatinine urine ratio - CBC with Differential/Platelet - COMPLETE METABOLIC PANEL WITH  GFR - Magnesium - Lipid panel - TSH - Hemoglobin A1c - Insulin, random - VITAMIN D 25 Hydroxy         Patient was counseled in prudent diet to achieve/maintain BMI less than 25 for weight control, BP monitoring, regular exercise and medications. Discussed med's effects and SE's. Screening labs and tests as requested with regular follow-up as recommended. Over 40 minutes of exam,  counseling, chart review and high complex critical decision making was performed.   Kirtland Bouchard, MD

## 2019-12-03 NOTE — Patient Instructions (Signed)

## 2019-12-04 ENCOUNTER — Other Ambulatory Visit: Payer: Self-pay

## 2019-12-04 ENCOUNTER — Ambulatory Visit (INDEPENDENT_AMBULATORY_CARE_PROVIDER_SITE_OTHER): Payer: Medicare Other | Admitting: Internal Medicine

## 2019-12-04 VITALS — BP 116/64 | HR 64 | Temp 97.6°F | Resp 16 | Ht 62.0 in | Wt 180.0 lb

## 2019-12-04 DIAGNOSIS — Z0001 Encounter for general adult medical examination with abnormal findings: Secondary | ICD-10-CM

## 2019-12-04 DIAGNOSIS — Z1211 Encounter for screening for malignant neoplasm of colon: Secondary | ICD-10-CM

## 2019-12-04 DIAGNOSIS — Z0000 Encounter for general adult medical examination without abnormal findings: Secondary | ICD-10-CM

## 2019-12-04 DIAGNOSIS — Z79899 Other long term (current) drug therapy: Secondary | ICD-10-CM

## 2019-12-04 DIAGNOSIS — E782 Mixed hyperlipidemia: Secondary | ICD-10-CM | POA: Diagnosis not present

## 2019-12-04 DIAGNOSIS — Z1212 Encounter for screening for malignant neoplasm of rectum: Secondary | ICD-10-CM

## 2019-12-04 DIAGNOSIS — Z136 Encounter for screening for cardiovascular disorders: Secondary | ICD-10-CM

## 2019-12-04 DIAGNOSIS — Z8249 Family history of ischemic heart disease and other diseases of the circulatory system: Secondary | ICD-10-CM | POA: Diagnosis not present

## 2019-12-04 DIAGNOSIS — R7303 Prediabetes: Secondary | ICD-10-CM | POA: Diagnosis not present

## 2019-12-04 DIAGNOSIS — R7309 Other abnormal glucose: Secondary | ICD-10-CM | POA: Diagnosis not present

## 2019-12-04 DIAGNOSIS — R0989 Other specified symptoms and signs involving the circulatory and respiratory systems: Secondary | ICD-10-CM | POA: Diagnosis not present

## 2019-12-04 DIAGNOSIS — E559 Vitamin D deficiency, unspecified: Secondary | ICD-10-CM

## 2019-12-04 DIAGNOSIS — M159 Polyosteoarthritis, unspecified: Secondary | ICD-10-CM

## 2019-12-04 DIAGNOSIS — U071 COVID-19: Secondary | ICD-10-CM

## 2019-12-05 LAB — LIPID PANEL
Cholesterol: 158 mg/dL (ref <200)
HDL: 50 mg/dL (ref >=50)
LDL Cholesterol (Calc): 90 mg/dL (calc)
Non-HDL Cholesterol (Calc): 108 mg/dL (calc) (ref <130)
Total CHOL/HDL Ratio: 3.2 (calc) (ref <5.0)
Triglycerides: 90 mg/dL (ref <150)

## 2019-12-05 LAB — CBC WITH DIFFERENTIAL/PLATELET
Absolute Monocytes: 534 cells/uL (ref 200–950)
Basophils Absolute: 41 cells/uL (ref 0–200)
Basophils Relative: 0.7 %
Eosinophils Absolute: 203 cells/uL (ref 15–500)
Eosinophils Relative: 3.5 %
HCT: 43.4 % (ref 35.0–45.0)
Hemoglobin: 14.6 g/dL (ref 11.7–15.5)
Lymphs Abs: 2366 cells/uL (ref 850–3900)
MCH: 30.5 pg (ref 27.0–33.0)
MCHC: 33.6 g/dL (ref 32.0–36.0)
MCV: 90.6 fL (ref 80.0–100.0)
MPV: 10.2 fL (ref 7.5–12.5)
Monocytes Relative: 9.2 %
Neutro Abs: 2656 cells/uL (ref 1500–7800)
Neutrophils Relative %: 45.8 %
Platelets: 275 10*3/uL (ref 140–400)
RBC: 4.79 10*6/uL (ref 3.80–5.10)
RDW: 13.8 % (ref 11.0–15.0)
Total Lymphocyte: 40.8 %
WBC: 5.8 10*3/uL (ref 3.8–10.8)

## 2019-12-05 LAB — COMPLETE METABOLIC PANEL WITH GFR
AG Ratio: 1.8 (calc) (ref 1.0–2.5)
ALT: 18 U/L (ref 6–29)
AST: 20 U/L (ref 10–35)
Albumin: 4.7 g/dL (ref 3.6–5.1)
Alkaline phosphatase (APISO): 67 U/L (ref 37–153)
BUN/Creatinine Ratio: 21 (calc) (ref 6–22)
BUN: 19 mg/dL (ref 7–25)
CO2: 26 mmol/L (ref 20–32)
Calcium: 10.2 mg/dL (ref 8.6–10.4)
Chloride: 106 mmol/L (ref 98–110)
Creat: 0.9 mg/dL — ABNORMAL HIGH (ref 0.60–0.88)
GFR, Est African American: 69 mL/min/{1.73_m2} (ref >=60)
GFR, Est Non African American: 60 mL/min/{1.73_m2} (ref >=60)
Globulin: 2.6 g/dL (calc) (ref 1.9–3.7)
Glucose, Bld: 95 mg/dL (ref 65–99)
Potassium: 4.7 mmol/L (ref 3.5–5.3)
Sodium: 139 mmol/L (ref 135–146)
Total Bilirubin: 0.5 mg/dL (ref 0.2–1.2)
Total Protein: 7.3 g/dL (ref 6.1–8.1)

## 2019-12-05 LAB — URINALYSIS, ROUTINE W REFLEX MICROSCOPIC
Bacteria, UA: NONE SEEN /HPF
Bilirubin Urine: NEGATIVE
Glucose, UA: NEGATIVE
Hgb urine dipstick: NEGATIVE
Hyaline Cast: NONE SEEN /LPF
Ketones, ur: NEGATIVE
Nitrite: NEGATIVE
Protein, ur: NEGATIVE
RBC / HPF: NONE SEEN /HPF (ref 0–2)
Specific Gravity, Urine: 1.007 (ref 1.001–1.03)
Squamous Epithelial / HPF: NONE SEEN /HPF (ref <=5)
pH: 6 (ref 5.0–8.0)

## 2019-12-05 LAB — HEMOGLOBIN A1C
Hgb A1c MFr Bld: 5.8 % of total Hgb — ABNORMAL HIGH (ref <5.7)
Mean Plasma Glucose: 120 (calc)
eAG (mmol/L): 6.6 (calc)

## 2019-12-05 LAB — SARS-COV-2 ANTIBODY(IGG)SPIKE,SEMI-QUANTITATIVE: SARS COV1 AB(IGG)SPIKE,SEMI QN: >20 index — ABNORMAL HIGH (ref <1.00)

## 2019-12-05 LAB — MAGNESIUM: Magnesium: 2.3 mg/dL (ref 1.5–2.5)

## 2019-12-05 LAB — INSULIN, RANDOM: Insulin: 16.7 u[IU]/mL

## 2019-12-05 LAB — MICROALBUMIN / CREATININE URINE RATIO
Creatinine, Urine: 21 mg/dL (ref 20–275)
Microalb, Ur: <0.2 mg/dL

## 2019-12-05 LAB — VITAMIN D 25 HYDROXY (VIT D DEFICIENCY, FRACTURES): Vit D, 25-Hydroxy: 56 ng/mL (ref 30–100)

## 2019-12-05 LAB — TSH: TSH: 1.77 mIU/L (ref 0.40–4.50)

## 2019-12-27 ENCOUNTER — Other Ambulatory Visit: Payer: Self-pay | Admitting: *Deleted

## 2019-12-27 DIAGNOSIS — Z1212 Encounter for screening for malignant neoplasm of rectum: Secondary | ICD-10-CM

## 2019-12-27 DIAGNOSIS — Z1211 Encounter for screening for malignant neoplasm of colon: Secondary | ICD-10-CM

## 2019-12-27 LAB — POC HEMOCCULT BLD/STL (HOME/3-CARD/SCREEN)
Card #2 Fecal Occult Blod, POC: NEGATIVE
Card #3 Fecal Occult Blood, POC: NEGATIVE
Fecal Occult Blood, POC: NEGATIVE

## 2019-12-28 DIAGNOSIS — Z1211 Encounter for screening for malignant neoplasm of colon: Secondary | ICD-10-CM | POA: Diagnosis not present

## 2019-12-28 DIAGNOSIS — Z1212 Encounter for screening for malignant neoplasm of rectum: Secondary | ICD-10-CM | POA: Diagnosis not present

## 2020-01-01 DIAGNOSIS — H353111 Nonexudative age-related macular degeneration, right eye, early dry stage: Secondary | ICD-10-CM | POA: Diagnosis not present

## 2020-01-01 DIAGNOSIS — Z961 Presence of intraocular lens: Secondary | ICD-10-CM | POA: Diagnosis not present

## 2020-01-01 DIAGNOSIS — H40013 Open angle with borderline findings, low risk, bilateral: Secondary | ICD-10-CM | POA: Diagnosis not present

## 2020-01-01 DIAGNOSIS — H35362 Drusen (degenerative) of macula, left eye: Secondary | ICD-10-CM | POA: Diagnosis not present

## 2020-01-01 LAB — HM DIABETES EYE EXAM

## 2020-01-04 ENCOUNTER — Encounter: Payer: Self-pay | Admitting: *Deleted

## 2020-01-29 ENCOUNTER — Other Ambulatory Visit: Payer: Self-pay | Admitting: Internal Medicine

## 2020-01-29 DIAGNOSIS — Z1231 Encounter for screening mammogram for malignant neoplasm of breast: Secondary | ICD-10-CM

## 2020-02-07 ENCOUNTER — Ambulatory Visit: Payer: Medicare Other | Admitting: Adult Health

## 2020-02-07 ENCOUNTER — Other Ambulatory Visit: Payer: Self-pay

## 2020-02-07 ENCOUNTER — Ambulatory Visit (INDEPENDENT_AMBULATORY_CARE_PROVIDER_SITE_OTHER): Payer: Medicare Other | Admitting: Internal Medicine

## 2020-02-07 ENCOUNTER — Encounter: Payer: Self-pay | Admitting: Internal Medicine

## 2020-02-07 VITALS — BP 126/80 | HR 76 | Temp 97.9°F | Resp 16 | Ht 62.0 in | Wt 180.2 lb

## 2020-02-07 DIAGNOSIS — M1000 Idiopathic gout, unspecified site: Secondary | ICD-10-CM | POA: Diagnosis not present

## 2020-02-07 DIAGNOSIS — Z79899 Other long term (current) drug therapy: Secondary | ICD-10-CM

## 2020-02-07 LAB — CBC WITH DIFFERENTIAL/PLATELET
Absolute Monocytes: 605 cells/uL (ref 200–950)
Basophils Absolute: 50 cells/uL (ref 0–200)
Basophils Relative: 0.9 %
Eosinophils Absolute: 269 cells/uL (ref 15–500)
Eosinophils Relative: 4.8 %
HCT: 39.3 % (ref 35.0–45.0)
Hemoglobin: 13.2 g/dL (ref 11.7–15.5)
Lymphs Abs: 1977 cells/uL (ref 850–3900)
MCH: 31.3 pg (ref 27.0–33.0)
MCHC: 33.6 g/dL (ref 32.0–36.0)
MCV: 93.1 fL (ref 80.0–100.0)
MPV: 11 fL (ref 7.5–12.5)
Monocytes Relative: 10.8 %
Neutro Abs: 2699 cells/uL (ref 1500–7800)
Neutrophils Relative %: 48.2 %
Platelets: 271 10*3/uL (ref 140–400)
RBC: 4.22 10*6/uL (ref 3.80–5.10)
RDW: 13.3 % (ref 11.0–15.0)
Total Lymphocyte: 35.3 %
WBC: 5.6 10*3/uL (ref 3.8–10.8)

## 2020-02-07 LAB — URIC ACID: Uric Acid, Serum: 7.1 mg/dL — ABNORMAL HIGH (ref 2.5–7.0)

## 2020-02-07 LAB — SEDIMENTATION RATE: Sed Rate: 33 mm/h — ABNORMAL HIGH (ref 0–30)

## 2020-02-07 MED ORDER — DEXAMETHASONE 4 MG PO TABS: ORAL_TABLET | ORAL | 1 refills | Status: DC

## 2020-02-07 MED ORDER — ALLOPURINOL 300 MG PO TABS: ORAL_TABLET | ORAL | 0 refills | Status: DC

## 2020-02-07 NOTE — Patient Instructions (Signed)
Gout  Gout is a condition that causes painful swelling of the joints. Gout is a type of inflammation of the joints (arthritis). This condition is caused by having too much uric acid in the body. Uric acid is a chemical that forms when the body breaks down substances called purines. Purines are important for building body proteins. When the body has too much uric acid, sharp crystals can form and build up inside the joints. This causes pain and swelling. Gout attacks can happen quickly and may be very painful (acute gout). Over time, the attacks can affect more joints and become more frequent (chronic gout). Gout can also cause uric acid to build up under the skin and inside the kidneys. What are the causes? This condition is caused by too much uric acid in your blood. This can happen because:  Your kidneys do not remove enough uric acid from your blood. This is the most common cause.  Your body makes too much uric acid. This can happen with some cancers and cancer treatments. It can also occur if your body is breaking down too many red blood cells (hemolytic anemia).  You eat too many foods that are high in purines. These foods include organ meats and some seafood. Alcohol, especially beer, is also high in purines. A gout attack may be triggered by trauma or stress. What increases the risk? You are more likely to develop this condition if you:  Have a family history of gout.  Are female and middle-aged.  Are female and have gone through menopause.  Are obese.  Frequently drink alcohol, especially beer.  Are dehydrated.  Lose weight too quickly.  Have an organ transplant.  Have lead poisoning.  Take certain medicines, including aspirin, cyclosporine, diuretics, levodopa, and niacin.  Have kidney disease.  Have a skin condition called psoriasis. What are the signs or symptoms? n attack of acute gout happens quickly. It usually occurs in just one joint. The most common place is the  big toe. Attacks often start at night. Other joints that may be affected include joints of the feet, ankle, knee, fingers, wrist, or elbow. Symptoms of this condition may include:  Severe pain.  Warmth.  Swelling.  Stiffness.  Tenderness. The affected joint may be very painful to touch.  Shiny, red, or purple skin.  Chills and fever. Chronic gout may cause symptoms more frequently. More joints may be involved. You may also have white or yellow lumps (tophi) on your hands or feet or in other areas near your joints. How is this diagnosed? This condition is diagnosed based on your symptoms, medical history, and physical exam. You may have tests, such as:  Blood tests to measure uric acid levels.  Removal of joint fluid with a thin needle (aspiration) to look for uric acid crystals.  X-rays to look for joint damage. How is this treated? Treatment for this condition has two phases: treating an acute attack and preventing future attacks. Acute gout treatment may include medicines to reduce pain and swelling, including:  NSAIDs.  Steroids. These are strong anti-inflammatory medicines that can be taken by mouth (orally) or injected into a joint.  Colchicine. This medicine relieves pain and swelling when it is taken soon after an attack. It can be given by mouth or through an IV. Preventive treatment may include:  Daily use of smaller doses of NSAIDs or colchicine.  Use of a medicine that reduces uric acid levels in your blood.  Changes to your diet. You may  need to see a dietitian about what to eat and drink to prevent gout. Follow these instructions at home: During a gout attack   If directed, put ice on the affected area: ? Put ice in a plastic bag. ? Place a towel between your skin and the bag. ? Leave the ice on for 20 minutes, 2-3 times a day.  Raise (elevate) the affected joint above the level of your heart as often as possible.  Rest the joint as much as possible. If  the affected joint is in your leg, you may be given crutches to use.  Follow instructions from your health care provider about eating or drinking restrictions. Avoiding future gout attacks  Follow a low-purine diet as told by your dietitian or health care provider. Avoid foods and drinks that are high in purines, including liver, kidney, anchovies, asparagus, herring, mushrooms, mussels, and beer.  Maintain a healthy weight or lose weight if you are overweight. If you want to lose weight, talk with your health care provider. It is important that you do not lose weight too quickly.  Start or maintain an exercise program as told by your health care provider. Eating and drinking  Drink enough fluids to keep your urine pale yellow.  If you drink alcohol: ? Limit how much you use to:  0-1 drink a day for women.  0-2 drinks a day for men. ? Be aware of how much alcohol is in your drink. In the [place] one drink equals one 12 oz bottle of beer (355 mL) one 5 oz glass of wine (148 mL), or one 1 oz glass of hard liquor (44 mL). General instructions  Take over-the-counter and prescription medicines only as told by your health care provider.  Do not drive or use heavy machinery while taking prescription pain medicine.  Return to your normal activities as told by your health care provider. Ask your health care provider what activities are safe for you.  Keep all follow-up visits as told by your health care provider. This is important. Contact a health care provider if you have:  Another gout attack.  Continuing symptoms of a gout attack after 10 days of treatment.  Side effects from your medicines.  Chills or a fever.  Burning pain when you urinate.  Pain in your lower back or belly. Get help right away if you:  Have severe or uncontrolled pain.  Cannot urinate. Summary  Gout is painful swelling of the joints caused by inflammation.  The most common site of pain is the big toe,  but it can affect other joints in the body.  Medicines and dietary changes can help to prevent and treat gout attacks.

## 2020-02-07 NOTE — Progress Notes (Addendum)
   History of Present Illness:  Patient presents with c/o pain in the Rt foot - MTP area.   Medications  .  telmisartan  80 MG tablet, Take 1 tablet Daily for BP .  aspirin EC 81 MG , Take  daily. Reported on 07/31/2015 .  naproxen  220 MG tablet, Take  2  times daily with a meal. PRN .  VITAMIN C GUMMIE Takes 1 gummy daily. Marland Kitchen  VITAMIN D, Take 10,000 Units  daily.  .  OTC, Take 1 Zinc gummy daily. Marland Kitchen  topiramate  50 MG tablet, Take 1/2 to 1 tablet 2 x /day at Suppertime & Bedtime for Dieting & Weight Loss  Problem list She has Labile hypertension; Other abnormal glucose (prediabetes); Vitamin D deficiency; Encounter for Medicare annual wellness exam; Hyperlipidemia; DJD (degenerative joint disease), multiple sites; and Class 2 severe obesity due to excess calories with serious comorbidity and body mass index (BMI) of 35.0 to 35.9 in adult [facility]) on their problem list.   Observations/Objective:  BP 126/80   Pulse 76   Temp 97.9 F (36.6 C)   Resp 16   Ht 5\' 2"  (1.575 m)   Wt 180 lb 3.2 oz (81.7 kg)   BMI 32.96 kg/m   HEENT - WNL. Neck - supple.  Chest - Clear equal BS. Cor - Nl HS. RRR w/o sig MGR. PP 1(+). No edema. MS- FROM w/o deformities.  Gait sl limping favoring the Rt foot. Tender inflamed Rt 1st MTP jt w/u signs of infection.  Neuro -  Nl w/o focal abnormalities.  Assessment and Plan:  1. Acute idiopathic gout, unspecified site  - Uric acid - CBC with Differential/Platelet - Sedimentation rate  - dexamethasone 4 MG ; Take 1 tab 3 x day - 2 days, then 2 x day - 2 days, then 1 tab daily  Disp: 13 tab; Rf: 1  - allopurinol  300 MG tablet; Take 1 tablet Daily to Prevent Gout  Disp: 90 tablet  2. Medication management  - Uric acid - CBC with Differential/Platelet - Sedimentation rate  Follow Up Instructions:       I discussed the assessment and treatment plan with the patient. The patient was provided an opportunity to ask questions and all were answered. The  patient agreed with the plan and demonstrated an understanding of the instructions.     The patient was advised to call back or seek an in-person evaluation if the symptoms worsen or if the condition fails to improve as anticipated.   Kirtland Bouchard, MD

## 2020-02-08 ENCOUNTER — Ambulatory Visit
Admission: RE | Admit: 2020-02-08 | Discharge: 2020-02-08 | Disposition: A | Discharge status: Home / Self Care | Payer: Medicare Other | Source: Ambulatory Visit | Attending: Internal Medicine | Admitting: Internal Medicine

## 2020-02-08 DIAGNOSIS — Z1231 Encounter for screening mammogram for malignant neoplasm of breast: Secondary | ICD-10-CM

## 2020-02-08 NOTE — Progress Notes (Signed)
=============================================================  -    CBC - Normal  OK  =============================================================  - Gout Blood test is elevated or Positive for Gout, So...................  - Continue medicines  =============================================================

## 2020-02-18 ENCOUNTER — Telehealth: Payer: Self-pay | Admitting: *Deleted

## 2020-02-18 ENCOUNTER — Other Ambulatory Visit: Payer: Self-pay | Admitting: Internal Medicine

## 2020-02-18 DIAGNOSIS — M79671 Pain in right foot: Secondary | ICD-10-CM

## 2020-02-18 NOTE — Telephone Encounter (Signed)
Patient called and states the Allopurinol is causing nausea and dizziness. Her foot also is still painful. Dr Melford Aase put in an order for a right foot x-ray and advised the patient to schedule an office visit after the x-ray is done for more labs and an exam. Patient is aware.

## 2020-02-19 ENCOUNTER — Ambulatory Visit
Admission: RE | Admit: 2020-02-19 | Discharge: 2020-02-19 | Disposition: A | Discharge status: Home / Self Care | Payer: Medicare Other | Source: Ambulatory Visit | Attending: Internal Medicine | Admitting: Internal Medicine

## 2020-02-19 DIAGNOSIS — M79671 Pain in right foot: Secondary | ICD-10-CM | POA: Diagnosis not present

## 2020-02-19 DIAGNOSIS — M7989 Other specified soft tissue disorders: Secondary | ICD-10-CM | POA: Diagnosis not present

## 2020-02-20 ENCOUNTER — Other Ambulatory Visit: Payer: Self-pay | Admitting: *Deleted

## 2020-02-20 NOTE — Telephone Encounter (Signed)
Patient called and asked if she can wait until her 03/05/2020 OV to recheck her foot, since her recent X-ray was negative.  Per Dr Melford Aase, it is OK to wait, but advised patient to restart Allopurinol at 1/2 tablet, with food. Patient is aware and will call if she needs an appointment sooner.

## 2020-03-04 NOTE — Progress Notes (Signed)
MEDICARE ANNUAL WELLNESS VISIT AND FOLLOW UP  Assessment:    Medicare annual wellness visit  Labile hypertension - continue medications, DASH diet, exercise and monitor at home. Call if greater than 130/80.  -     CBC with Differential/Platelet -     CMP/GFR -     TSH  Prediabetes Discussed general issues about diabetes pathophysiology and management., Educational material distributed., Suggested low cholesterol diet., Encouraged aerobic exercise., Discussed foot care., Reminded to get yearly retinal exam.  Elevated cholesterol -continue medications, check lipids, decrease fatty foods, increase activity.  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, had SE with 300 mg Discussed diet/lifestyle; discussed colchicine to hold and start if future gout flares   Over 30 minutes of exam, counseling, chart review, and critical decision making was performed  Future Appointments  Date Time Provider [place]  06/10/2020 10:30 AM [name], MD [facility] None  12/04/2020 11:00 AM [name], NP [facility] None    Plan:   During the course of the visit the patient was educated and counseled about appropriate screening and preventive services including:    Pneumococcal vaccine   Influenza vaccine  Td vaccine  Prevnar 13  Screening electrocardiogram  Screening mammography  Bone densitometry screening  Colorectal cancer screening  Diabetes screening  Glaucoma screening  Nutrition counseling   Advanced directives: given info/requested copies   Subjective:   Angelica Romero is a 79 y.o. female who presents for Medicare Annual Wellness Visit and 3 month follow up on hypertension, prediabetes,  hyperlipidemia, vitamin D def and morbid obesity.  Husband passed away 4 months ago. She is working with therapist at [facility]. She feels she is doing fairly well, but does have spontaneous episodes of crying. Living by herself now, has cats and dogs to keep her company. Family is close by.   Patient has hx of disabling polyarthralgias without clear etiology but doing well with PRN prednisone use, not needing frequently recently, and maintains mobility and independence.   BMI is Body mass index is 32.56 kg/m., she has been working on diet, stops eating after sundown, watching portions. Active around her yard but not intentionally active.  Wt Readings from Last 3 Encounters:  03/05/20 178 lb (80.7 kg)  02/07/20 180 lb 3.2 oz (81.7 kg)  12/04/19 180 lb (81.6 kg)   Her blood pressure has been controlled at home, today their BP is BP: 112/68 She does workout. She denies chest pain, shortness of breath, dizziness.   She is not on cholesterol medication and denies myalgias. Her cholesterol is at goal. The cholesterol last visit was:   Lab Results  Component Value Date   CHOL 158 12/04/2019   HDL 50 12/04/2019   LDLCALC 90 12/04/2019   TRIG 90 12/04/2019   CHOLHDL 3.2 12/04/2019   She reports that she has been working on diet and exercise.  Lab Results  Component Value Date   HGBA1C 5.8 (H) 12/04/2019   Last GFR Lab Results  Component Value Date   GFRNONAA 60 12/04/2019   Patient is on Vitamin D supplement, has started taking regularly, 5000 IU since  Lab Results  Component Value Date   VD25OH 56 12/04/2019     Patient is on allopurinol for gout  and does report a recent flare. She reports  Lab Results  Component Value Date   LABURIC 7.1 (H) 02/07/2020      Medication Review Current Outpatient Medications on File Prior to Visit  Medication Sig Dispense Refill  . allopurinol (ZYLOPRIM) 300 MG tablet Take 1 tablet Daily to Prevent Gout (Patient taking differently: Take 1/2  tablet Daily to Prevent Gout) 90 tablet 0  . Ascorbic Acid (VITAMIN C GUMMIE PO) Take by mouth. Takes 1 gummy daily.    Marland Kitchen aspirin EC 81 MG tablet Take 81 mg by mouth daily. Reported on 07/31/2015    . Cholecalciferol (VITAMIN D PO) Take 10,000 Units by mouth daily.     . naproxen sodium (ANAPROX) 220 MG tablet Take 220 mg by mouth 2 (two) times daily with a meal. PRN    . OVER THE COUNTER MEDICATION Take 1 Zinc gummy daily.    Marland Kitchen telmisartan (MICARDIS) 80 MG tablet Take 1 tablet Daily for BP 90 tablet 3  . dexamethasone (DECADRON) 4 MG tablet Take 1 tab 3 x day - 2 days, then 2 x day - 2 days, then 1 tab daily 13 tablet 1  . topiramate (TOPAMAX) 50 MG tablet Take 1/2 to 1 tablet 2 x /day at Suppertime & Bedtime for Dieting & Weight Loss (Patient not taking: Reported on 03/05/2020) 180 tablet 1   No current facility-administered medications on file prior to visit.    Allergies: Allergies  Allergen Reactions  . Codeine Nausea And Vomiting  . Hydrocodone Nausea And Vomiting  . Lisinopril Cough    cough  . Omeprazole Nausea And Vomiting    Current Problems (verified) has Labile hypertension; Other abnormal glucose (prediabetes); Vitamin D deficiency; Encounter for Medicare annual wellness exam; Hyperlipidemia; DJD (degenerative joint disease), multiple sites; and Class 2 severe obesity due to excess calories with serious comorbidity and body mass index (BMI) of 35.0 to 35.9 in adult [facility]) on their problem list.  Screening Tests Immunization History  Administered Date(s) Administered  . DT (Pediatric) 07/31/2015  . DTaP 06/09/2005  . Influenza, High Dose Seasonal PF 04/13/2017  . Influenza-Unspecified 04/09/2014, 05/10/2015, 04/22/2016  . Pneumococcal Conjugate-13 07/01/2014  . Pneumococcal-Unspecified 06/10/1999, 06/09/2008  . Zoster 06/10/2007    Preventative care: Last colonoscopy: cologuard - 10/2018 negative - DONE Last mammogram: 02/2020 DEXA: 2013 normal - will order to get  with mammogram next year  06/2014 lumbar spine CXR 2007  Prior vaccinations: TD or Tdap: 2016  Influenza: 2019  Pneumococcal: 2009 Prevnar13: 2015 Shingles/Zostavax: 2008 Covid 19: declines,   Names of Other Physician/Practitioners you currently use: 1. [facility]- here for primary care 2. Dr. Santo Held, eye doctor, last visit 2021 3. Dr. Orene Desanctis, dentist, last visit 2021,  q 6 months, going today   Patient Care Team: [name], MD as PCP - General (Internal Medicine) Monna Fam, MD as Consulting Physician (Ophthalmology) Druscilla Brownie, MD as Consulting Physician (Dermatology)  Surgical: She  has no past surgical history on file. Family Her family history includes Breast cancer in her mother. Social history  She reports that she has never smoked. She has never used smokeless tobacco. She reports that she does not drink alcohol. No history on file for drug use.  MEDICARE WELLNESS OBJECTIVES: Physical activity:   Cardiac risk factors:   Depression/mood screen:   Depression screen [facility] 2/9 12/03/2019  Decreased Interest 0  Down, Depressed, Hopeless 0  PHQ - 2 Score 0    ADLs:  In your present state of health, do you have any difficulty performing the following activities: 12/03/2019  Hearing? N  Vision? N  Difficulty concentrating or making decisions? N  Walking or climbing stairs? N  Dressing or bathing? N  Doing errands, shopping? N  Some recent data might be hidden     Cognitive Testing  Alert? Yes  Normal Appearance?Yes  Oriented to person? Yes  Place? Yes   Time? Yes  Recall of three objects?  Yes  Can perform simple calculations? Yes  Displays appropriate judgment?Yes  Can read the correct time from a watch face?Yes  EOL planning: Does Patient Have a Medical Advance Directive?: Yes Type of Advance Directive: Healthcare Power of Attorney, Living will Does patient want to make changes to medical advance  directive?: No - Patient declined Copy of [place] in Chart?: No - copy requested   Objective:   Today's Vitals   03/05/20 1006  BP: 112/68  Pulse: 75  Temp: (!) 96.8 F (36 C)  SpO2: 96%  Weight: 178 lb (80.7 kg)   Body mass index is 32.56 kg/m.  General appearance: alert, no distress, WD/WN,  female HEENT: normocephalic, sclerae anicteric, TMs pearly, nares patent, no discharge or erythema, pharynx normal Oral cavity: MMM, no lesions Neck: supple, no lymphadenopathy, no thyromegaly, no masses Heart: RRR, normal S1, S2, no murmurs Lungs: CTA bilaterally, no wheezes, rhonchi, or rales Abdomen: +bs, soft, non tender, non distended, no masses, no hepatomegaly, no splenomegaly Musculoskeletal: nontender, no swelling, no obvious deformity Extremities: no edema, no cyanosis, no clubbing Pulses: 2+ symmetric, upper and lower extremities, normal cap refill Neurological: alert, oriented x 3, CN2-12 intact, strength normal upper extremities and lower extremities, sensation normal throughout, DTRs 2+ throughout, no cerebellar signs, gait normal Psychiatric: normal affect, behavior normal, pleasant  Breast: defer Gyn: defer Rectal: defer   Medicare Attestation I have personally reviewed: The patient's medical and social history Their use of alcohol, tobacco or illicit drugs Their current medications and supplements The patient's functional ability including ADLs,fall risks, home safety risks, cognitive, and hearing and visual impairment Diet and physical activities Evidence for depression or mood disorders  The patient's weight, height, BMI, and visual acuity have been recorded in the chart.  I have made referrals, counseling, and provided education to the patient based on review of the above and I have provided the patient with a written personalized care plan for preventive services.     Izora Ribas, NP   03/05/2020

## 2020-03-05 ENCOUNTER — Other Ambulatory Visit: Payer: Self-pay

## 2020-03-05 ENCOUNTER — Encounter: Payer: Self-pay | Admitting: Adult Health

## 2020-03-05 ENCOUNTER — Ambulatory Visit (INDEPENDENT_AMBULATORY_CARE_PROVIDER_SITE_OTHER): Payer: Medicare Other | Admitting: Adult Health

## 2020-03-05 VITALS — BP 112/68 | HR 75 | Temp 96.8°F | Wt 178.0 lb

## 2020-03-05 DIAGNOSIS — E782 Mixed hyperlipidemia: Secondary | ICD-10-CM

## 2020-03-05 DIAGNOSIS — R6889 Other general symptoms and signs: Secondary | ICD-10-CM | POA: Diagnosis not present

## 2020-03-05 DIAGNOSIS — R0989 Other specified symptoms and signs involving the circulatory and respiratory systems: Secondary | ICD-10-CM

## 2020-03-05 DIAGNOSIS — M159 Polyosteoarthritis, unspecified: Secondary | ICD-10-CM

## 2020-03-05 DIAGNOSIS — Z0001 Encounter for general adult medical examination with abnormal findings: Secondary | ICD-10-CM

## 2020-03-05 DIAGNOSIS — R7309 Other abnormal glucose: Secondary | ICD-10-CM | POA: Diagnosis not present

## 2020-03-05 DIAGNOSIS — E790 Hyperuricemia without signs of inflammatory arthritis and tophaceous disease: Secondary | ICD-10-CM | POA: Diagnosis not present

## 2020-03-05 DIAGNOSIS — E559 Vitamin D deficiency, unspecified: Secondary | ICD-10-CM | POA: Diagnosis not present

## 2020-03-05 DIAGNOSIS — E2839 Other primary ovarian failure: Secondary | ICD-10-CM

## 2020-03-05 DIAGNOSIS — Z0000 Encounter for general adult medical examination without abnormal findings: Secondary | ICD-10-CM

## 2020-03-05 DIAGNOSIS — Z6835 Body mass index (BMI) 35.0-35.9, adult: Secondary | ICD-10-CM

## 2020-03-05 DIAGNOSIS — E66812 Obesity, class 2: Secondary | ICD-10-CM

## 2020-03-05 MED ORDER — COLCHICINE 0.6 MG PO TABS: ORAL_TABLET | ORAL | 0 refills | Status: DC

## 2020-03-05 NOTE — Patient Instructions (Addendum)
[name] , Thank you for taking time to come for your Medicare Wellness Visit. I appreciate your ongoing commitment to your health goals. Please review the following plan we discussed and let me know if I can assist you in the future.   These are the goals we discussed: Goals    . Blood Pressure < 130/80    . Weight (lb) < 180 lb (81.6 kg)       This is a list of the screening recommended for you and due dates:  Health Maintenance  Topic Date Due  . COVID-19 Vaccine (1) Never done  . Flu Shot  03/09/2020  . Tetanus Vaccine  07/30/2025  . DEXA scan (bone density measurement)  Completed  . Pneumonia vaccines  Completed     Schedule bone density exam with next mammogram      High-Fiber Diet Fiber, also called dietary fiber, is a type of carbohydrate that is found in fruits, vegetables, whole grains, and beans. A high-fiber diet can have many health benefits. Your health care provider may recommend a high-fiber diet to help:  Prevent constipation. Fiber can make your bowel movements more regular.  Lower your cholesterol.  Relieve the following conditions: ? Swelling of veins in the anus (hemorrhoids). ? Swelling and irritation (inflammation) of specific areas of the digestive tract (uncomplicated diverticulosis). ? A problem of the large intestine (colon) that sometimes causes pain and diarrhea (irritable bowel syndrome, IBS).  Prevent overeating as part of a weight-loss plan.  Prevent heart disease, type 2 diabetes, and certain cancers. What is my plan? The recommended daily fiber intake in grams (g) includes:  38 g for men age 30 or younger.  30 g for men over age 13.  69 g for women age 47 or younger.  21 g for women over age 83. You can get the recommended daily intake of dietary fiber by:  Eating a variety of fruits, vegetables, grains, and beans.  Taking a fiber supplement, if it is not possible to get enough fiber through your diet. What do I need to  know about a high-fiber diet?  It is better to get fiber through food sources rather than from fiber supplements. There is not a lot of research about how effective supplements are.  Always check the fiber content on the nutrition facts label of any prepackaged food. Look for foods that contain 5 g of fiber or more per serving.  Talk with a diet and nutrition specialist (dietitian) if you have questions about specific foods that are recommended or not recommended for your medical condition, especially if those foods are not listed below.  Gradually increase how much fiber you consume. If you increase your intake of dietary fiber too quickly, you may have bloating, cramping, or gas.  Drink plenty of water. Water helps you to digest fiber. What are tips for following this plan?  Eat a wide variety of high-fiber foods.  Make sure that half of the grains that you eat each day are whole grains.  Eat breads and cereals that are made with whole-grain flour instead of refined flour or white flour.  Eat brown rice, bulgur wheat, or millet instead of white rice.  Start the day with a breakfast that is high in fiber, such as a cereal that contains 5 g of fiber or more per serving.  Use beans in place of meat in soups, salads, and pasta dishes.  Eat high-fiber snacks, such as berries, raw vegetables, nuts, and popcorn.  Choose whole fruits and vegetables instead of processed forms like juice or sauce. What foods can I eat?  Fruits Berries. Pears. Apples. Oranges. Avocado. Prunes and raisins. Dried figs. Vegetables Sweet potatoes. Spinach. Kale. Artichokes. Cabbage. Broccoli. Cauliflower. Green peas. Carrots. Squash. Grains Whole-grain breads. Multigrain cereal. Oats and oatmeal. Brown rice. Barley. Bulgur wheat. [place]. Quinoa. Bran muffins. Popcorn. Rye wafer crackers. Meats and other proteins Navy, kidney, and pinto beans. Soybeans. Split peas. Lentils. Nuts and  seeds. Dairy Fiber-fortified yogurt. Beverages Fiber-fortified soy milk. Fiber-fortified orange juice. Other foods Fiber bars. The items listed above may not be a complete list of recommended foods and beverages. Contact a dietitian for more options. What foods are not recommended? Fruits Fruit juice. Cooked, strained fruit. Vegetables Fried potatoes. Canned vegetables. Well-cooked vegetables. Grains White bread. Pasta made with refined flour. White rice. Meats and other proteins Fatty cuts of meat. Fried chicken or fried fish. Dairy Milk. Yogurt. Cream cheese. Sour cream. Fats and oils Butters. Beverages Soft drinks. Other foods Cakes and pastries. The items listed above may not be a complete list of foods and beverages to avoid. Contact a dietitian for more information. Summary  Fiber is a type of carbohydrate. It is found in fruits, vegetables, whole grains, and beans.  There are many health benefits of eating a high-fiber diet, such as preventing constipation, lowering blood cholesterol, helping with weight loss, and reducing your risk of heart disease, diabetes, and certain cancers.  Gradually increase your intake of fiber. Increasing too fast can result in cramping, bloating, and gas. Drink plenty of water while you increase your fiber.  The best sources of fiber include whole fruits and vegetables, whole grains, nuts, seeds, and beans. This information is not intended to replace advice given to you by your health care provider. Make sure you discuss any questions you have with your health care provider. Document Revised: 05/30/2017 Document Reviewed: 05/30/2017 Elsevier Patient Education  [place] A low-purine eating plan involves making food choices to limit your intake of purine. Purine is a kind of uric acid. Too much uric acid in your blood can cause certain conditions, such as gout and kidney stones. Eating a low-purine diet  can help control these conditions. What are tips for following this plan? Reading food labels   Avoid foods with saturated or Trans fat.  Check the ingredient list of grains-based foods, such as bread and cereal, to make sure that they contain whole grains.  Check the ingredient list of sauces or soups to make sure they do not contain meat or fish.  When choosing soft drinks, check the ingredient list to make sure they do not contain high-fructose corn syrup. Shopping  Buy plenty of fresh fruits and vegetables.  Avoid buying canned or fresh fish.  Buy dairy products labeled as low-fat or nonfat.  Avoid buying premade or processed foods. These foods are often high in fat, salt (sodium), and added sugar. Cooking  Use olive oil instead of butter when cooking. Oils like olive oil, canola oil, and sunflower oil contain healthy fats. Meal planning  Learn which foods do or do not affect you. If you find out that a food tends to cause your gout symptoms to flare up, avoid eating that food. You can enjoy foods that do not cause problems. If you have any questions about a food item, talk with your dietitian or health care provider.  Limit foods high in fat, especially saturated fat. Fat  makes it harder for your body to get rid of uric acid.  Choose foods that are lower in fat and are lean sources of protein. General guidelines  Limit alcohol intake to no more than 1 drink a day for nonpregnant women and 2 drinks a day for men. One drink equals 12 oz of beer, 5 oz of wine, or 1 oz of hard liquor. Alcohol can affect the way your body gets rid of uric acid.  Drink plenty of water to keep your urine clear or pale yellow. Fluids can help remove uric acid from your body.  If directed by your health care provider, take a vitamin C supplement.  Work with your health care provider and dietitian to develop a plan to achieve or maintain a healthy weight. Losing weight can help reduce uric acid in  your blood. What foods are recommended? The items listed may not be a complete list. Talk with your dietitian about what dietary choices are best for you. Foods low in purines Foods low in purines do not need to be limited. These include:  All fruits.  All low-purine vegetables, pickles, and olives.  Breads, pasta, rice, cornbread, and popcorn. Cake and other baked goods.  All dairy foods.  Eggs, nuts, and nut butters.  Spices and condiments, such as salt, herbs, and vinegar.  Plant oils, butter, and margarine.  Water, sugar-free soft drinks, tea, coffee, and cocoa.  Vegetable-based soups, broths, sauces, and gravies. Foods moderate in purines Foods moderate in purines should be limited to the amounts listed.   cup of asparagus, cauliflower, spinach, mushrooms, or green peas, each day.  2/3 cup uncooked oatmeal, each day.   cup dry wheat bran or wheat germ, each day.  2-3 ounces of meat or poultry, each day.  4-6 ounces of shellfish, such as crab, lobster, oysters, or shrimp, each day.  1 cup cooked beans, peas, or lentils, each day.  Soup, broths, or bouillon made from meat or fish. Limit these foods as much as possible. What foods are not recommended? The items listed may not be a complete list. Talk with your dietitian about what dietary choices are best for you. Limit your intake of foods high in purines, including:  Beer and other alcohol.  Meat-based gravy or sauce.  Canned or fresh fish, such as: ? Anchovies, sardines, herring, and tuna. ? Mussels and scallops. ? Codfish, trout, and haddock.  Berniece Salines.  Organ meats, such as: ? Liver or kidney. ? Tripe. ? Sweetbreads (thymus gland or pancreas).  Wild Clinical biochemist.  Yeast or yeast extract supplements.  Drinks sweetened with high-fructose corn syrup. Summary  Eating a low-purine diet can help control conditions caused by too much uric acid in the body, such as gout or kidney stones.  Choose  low-purine foods, limit alcohol, and limit foods high in fat.  You will learn over time which foods do or do not affect you. If you find out that a food tends to cause your gout symptoms to flare up, avoid eating that food. This information is not intended to replace advice given to you by your health care provider. Make sure you discuss any questions you have with your health care provider. Document Revised: 07/08/2017 Document Reviewed: 09/08/2016 Elsevier Patient Education  2020 [employer].

## 2020-03-06 ENCOUNTER — Other Ambulatory Visit: Payer: Self-pay | Admitting: Adult Health

## 2020-03-06 DIAGNOSIS — M1000 Idiopathic gout, unspecified site: Secondary | ICD-10-CM

## 2020-03-06 LAB — CBC WITH DIFFERENTIAL/PLATELET
Absolute Monocytes: 863 cells/uL (ref 200–950)
Basophils Absolute: 23 cells/uL (ref 0–200)
Basophils Relative: 0.3 %
Eosinophils Absolute: 915 cells/uL — ABNORMAL HIGH (ref 15–500)
Eosinophils Relative: 12.2 %
HCT: 41.8 % (ref 35.0–45.0)
Hemoglobin: 14 g/dL (ref 11.7–15.5)
Lymphs Abs: 1898 cells/uL (ref 850–3900)
MCH: 31.2 pg (ref 27.0–33.0)
MCHC: 33.5 g/dL (ref 32.0–36.0)
MCV: 93.1 fL (ref 80.0–100.0)
MPV: 10.4 fL (ref 7.5–12.5)
Monocytes Relative: 11.5 %
Neutro Abs: 3803 cells/uL (ref 1500–7800)
Neutrophils Relative %: 50.7 %
Platelets: 334 10*3/uL (ref 140–400)
RBC: 4.49 10*6/uL (ref 3.80–5.10)
RDW: 14.1 % (ref 11.0–15.0)
Total Lymphocyte: 25.3 %
WBC: 7.5 10*3/uL (ref 3.8–10.8)

## 2020-03-06 LAB — COMPLETE METABOLIC PANEL WITH GFR
AG Ratio: 1.6 (calc) (ref 1.0–2.5)
ALT: 28 U/L (ref 6–29)
AST: 19 U/L (ref 10–35)
Albumin: 4 g/dL (ref 3.6–5.1)
Alkaline phosphatase (APISO): 67 U/L (ref 37–153)
BUN/Creatinine Ratio: 20 (calc) (ref 6–22)
BUN: 18 mg/dL (ref 7–25)
CO2: 26 mmol/L (ref 20–32)
Calcium: 9.7 mg/dL (ref 8.6–10.4)
Chloride: 104 mmol/L (ref 98–110)
Creat: 0.9 mg/dL — ABNORMAL HIGH (ref 0.60–0.88)
GFR, Est African American: 69 mL/min/{1.73_m2} (ref >=60)
GFR, Est Non African American: 60 mL/min/{1.73_m2} (ref >=60)
Globulin: 2.5 g/dL (calc) (ref 1.9–3.7)
Glucose, Bld: 95 mg/dL (ref 65–99)
Potassium: 4.8 mmol/L (ref 3.5–5.3)
Sodium: 137 mmol/L (ref 135–146)
Total Bilirubin: 0.6 mg/dL (ref 0.2–1.2)
Total Protein: 6.5 g/dL (ref 6.1–8.1)

## 2020-03-06 LAB — LIPID PANEL
Cholesterol: 162 mg/dL (ref <200)
HDL: 47 mg/dL — ABNORMAL LOW (ref >=50)
LDL Cholesterol (Calc): 92 mg/dL (calc)
Non-HDL Cholesterol (Calc): 115 mg/dL (calc) (ref <130)
Total CHOL/HDL Ratio: 3.4 (calc) (ref <5.0)
Triglycerides: 134 mg/dL (ref <150)

## 2020-03-06 LAB — MAGNESIUM: Magnesium: 2.4 mg/dL (ref 1.5–2.5)

## 2020-03-06 LAB — URIC ACID: Uric Acid, Serum: 4.8 mg/dL (ref 2.5–7.0)

## 2020-03-06 LAB — TSH: TSH: 1.28 mIU/L (ref 0.40–4.50)

## 2020-03-06 MED ORDER — ALLOPURINOL 300 MG PO TABS: ORAL_TABLET | ORAL | 0 refills | Status: DC

## 2020-06-09 ENCOUNTER — Encounter: Payer: Self-pay | Admitting: Internal Medicine

## 2020-06-09 DIAGNOSIS — R7309 Other abnormal glucose: Secondary | ICD-10-CM | POA: Insufficient documentation

## 2020-06-09 DIAGNOSIS — E669 Obesity, unspecified: Secondary | ICD-10-CM | POA: Insufficient documentation

## 2020-06-09 DIAGNOSIS — R7303 Prediabetes: Secondary | ICD-10-CM | POA: Insufficient documentation

## 2020-06-09 NOTE — Progress Notes (Signed)
History of Present Illness:       This very nice 79 y.o.  [name] presents for 6 month follow up with HTN, HLD, Pre-Diabetes and Vitamin D Deficiency. Patient has hx/o Gout controlled on her meds. Patient has chronic pain consequent of her Osteoarthritis.       Today patient is c/o about a 2-3 week crescendo of increasing LBP with bilateral sciatica worse with activity & walking which she rates pain of 11 /10  !      Patient is treated for HTN ( 2016) & BP has been controlled at home. Today's BP is at goal - 116/80. Patient has had no complaints of any cardiac type chest pain, palpitations, dyspnea / orthopnea / PND, dizziness, claudication, or dependent edema.      Hyperlipidemia is controlled with diet & meds. Patient denies myalgias or other med SE's. Last Lipids were at goal:  Lab Results  Component Value Date   CHOL 162 03/05/2020   HDL 47 (L) 03/05/2020   LDLCALC 92 03/05/2020   TRIG 134 03/05/2020   CHOLHDL 3.4 03/05/2020    Also, the patient has Moderate Obesity (BMI 32.56) and consequent PreDiabetes (A1c 5.9% /2016) and has had no symptoms of reactive hypoglycemia, diabetic polys, paresthesias or visual blurring.  Last A1c was not at goal:  Lab Results  Component Value Date   HGBA1C 5.8 (H) 12/04/2019           Further, the patient also has history of Vitamin D Deficiency ("33"/2016)  and supplements vitamin D without any suspected side-effects. Last vitamin D was sl low  (goal 70-100):  Lab Results  Component Value Date   VD25OH 56 12/04/2019    Current Outpatient Medications on File Prior to Visit  Medication Sig  . allopurinol 300 MG tablet Take 1/2 tablet Daily  . VITAMIN C GUMMIE  Takes 1 gummy daily.  Marland Kitchen aspirin EC 81 MG tab Take daily.   Marland Kitchen VITAMIN D  Take 10,000 Units by mouth daily.   . colchicine 0.6 MG tab Take 1 pill daily as needed starting with onset of gout flare.  . naproxen 220 MG tab Take  2 times daily with a meal. PRN  . Zinc gummy  Take 1  daily.  Marland Kitchen telmisartan  80 MG tablet Take 1 tablet Daily for BP     Allergies  Allergen Reactions  . Codeine Nausea And Vomiting  . Hydrocodone Nausea And Vomiting  . Lisinopril Cough    cough  . Omeprazole Nausea And Vomiting    PMHx:   Past Medical History:  Diagnosis Date  . Elevated BP   . Prediabetes   . Vitamin D deficiency     Immunization History  Administered Date(s) Administered  . DT (Pediatric) 07/31/2015  . DTaP 06/09/2005  . Influenza, High Dose Seasonal PF 04/13/2017  . Influenza-Unspecified 04/09/2014, 05/10/2015, 04/22/2016  . Pneumococcal Conjugate-13 07/01/2014  . Pneumococcal-Unspecified 06/10/1999, 06/09/2008  . Zoster 06/10/2007    History reviewed. No pertinent surgical history.  FHx:    Reviewed / unchanged  SHx:    Reviewed / unchanged   Systems Review:  Constitutional: Denies fever, chills, wt changes, headaches, insomnia, fatigue, night sweats, change in appetite. Eyes: Denies redness, blurred vision, diplopia, discharge, itchy, watery eyes.  ENT: Denies discharge, congestion, post nasal drip, epistaxis, sore throat, earache, hearing loss, dental pain, tinnitus, vertigo, sinus pain, snoring.  CV: Denies chest pain, palpitations, irregular heartbeat, syncope, dyspnea, diaphoresis, orthopnea, PND,  claudication or edema. Respiratory: denies cough, dyspnea, DOE, pleurisy, hoarseness, laryngitis, wheezing.  Gastrointestinal: Denies dysphagia, odynophagia, heartburn, reflux, water brash, abdominal pain or cramps, nausea, vomiting, bloating, diarrhea, constipation, hematemesis, melena, hematochezia  or hemorrhoids. Genitourinary: Denies dysuria, frequency, urgency, nocturia, hesitancy, discharge, hematuria or flank pain. Musculoskeletal: Denies arthralgias, myalgias, stiffness, jt. swelling, pain, limping or strain/sprain.  Skin: Denies pruritus, rash, hives, warts, acne, eczema or change in skin lesion(s). Neuro: No weakness, tremor,  incoordination, spasms, paresthesia or pain. Psychiatric: Denies confusion, memory loss or sensory loss. Endo: Denies change in weight, skin or hair change.  Heme/Lymph: No excessive bleeding, bruising or enlarged lymph nodes.  Physical Exam  BP 116/80   Pulse 65   Temp 97.6 F (36.4 C)   Resp 16   Ht 5\' 2"  (1.575 m)   Wt 178 lb 9.6 oz (81 kg)   SpO2 98%   BMI 32.67 kg/m   Appears  well nourished, well groomed  and in no distress.  Eyes: PERRLA, EOMs, conjunctiva no swelling or erythema. Sinuses: No frontal/maxillary tenderness ENT/Mouth: EAC's clear, TM's nl w/o erythema, bulging. Nares clear w/o erythema, swelling, exudates. Oropharynx clear without erythema or exudates. Oral hygiene is good. Tongue normal, non obstructing. Hearing intact.  Neck: Supple. Thyroid not palpable. Car 2+/2+ without bruits, nodes or JVD. Chest: Respirations nl with BS clear & equal w/o rales, rhonchi, wheezing or stridor.  Cor: Heart sounds normal w/ regular rate and rhythm without sig. murmurs, gallops, clicks or rubs. Peripheral pulses normal and equal  without edema.  Abdomen: Soft & bowel sounds normal. Non-tender w/o guarding, rebound, hernias, masses or organomegaly.  Lymphatics: Unremarkable.  Musculoskeletal: Full ROM all peripheral extremities, joint stability, 5/5 strength and normal gait.  Skin: Warm, dry without exposed rashes, lesions or ecchymosis apparent.  Neuro: Cranial nerves intact, reflexes equal bilaterally. Sensory-motor testing grossly intact. Tendon reflexes grossly intact.  Pysch: Alert & oriented x 3.  Insight and judgement nl & appropriate. No ideations.  Assessment and Plan:  1. Labile hypertension  - CBC with Differential/Platelet - COMPLETE METABOLIC PANEL WITH GFR - Magnesium - TSH  2. Hyperlipidemia, mixed  - Lipid panel - TSH  3. Abnormal glucose  - Hemoglobin A1c - Insulin, random  4. Vitamin D deficiency   5. Prediabetes  - Hemoglobin A1c -  Insulin, random  6. 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 Obesity (BMI 30.0-34.9)  - TSH  8. Need for immunization against influenza  - Flu vaccine HIGH DOSE PF (Fluzone High dose)  9. Medication management  - CBC with Differential/Platelet - COMPLETE METABOLIC PANEL WITH GFR - Magnesium - Lipid panel - TSH - Hemoglobin A1c - Insulin, random - VITAMIN D 25 Hydroxy        Discussed  regular exercise, BP monitoring, weight control to achieve/maintain BMI less than 25 and discussed med and SE's. Recommended labs to assess and monitor clinical status with further disposition pending results of labs.  I discussed the assessment and treatment plan with the patient. The patient was provided an opportunity to  ask questions and all were answered. The patient agreed with the plan and demonstrated an understanding of the instructions.  I provided over 30 minutes of exam, counseling, chart review and  complex critical decision making.         The patient was advised to call back in 2 weeks to report sx response to the Decadron taper in consideration of ordering a Lumbar MRI to confirm suspected Spinal Stenosis.   Kirtland Bouchard, MD

## 2020-06-09 NOTE — Patient Instructions (Signed)

## 2020-06-10 ENCOUNTER — Ambulatory Visit (INDEPENDENT_AMBULATORY_CARE_PROVIDER_SITE_OTHER): Payer: Medicare Other | Admitting: Internal Medicine

## 2020-06-10 ENCOUNTER — Other Ambulatory Visit: Payer: Self-pay

## 2020-06-10 VITALS — BP 116/80 | HR 65 | Temp 97.6°F | Resp 16 | Ht 62.0 in | Wt 178.6 lb

## 2020-06-10 DIAGNOSIS — Z23 Encounter for immunization: Secondary | ICD-10-CM

## 2020-06-10 DIAGNOSIS — R7303 Prediabetes: Secondary | ICD-10-CM | POA: Diagnosis not present

## 2020-06-10 DIAGNOSIS — E559 Vitamin D deficiency, unspecified: Secondary | ICD-10-CM

## 2020-06-10 DIAGNOSIS — R0989 Other specified symptoms and signs involving the circulatory and respiratory systems: Secondary | ICD-10-CM | POA: Diagnosis not present

## 2020-06-10 DIAGNOSIS — E66811 Obesity, class 1: Secondary | ICD-10-CM

## 2020-06-10 DIAGNOSIS — E669 Obesity, unspecified: Secondary | ICD-10-CM

## 2020-06-10 DIAGNOSIS — E782 Mixed hyperlipidemia: Secondary | ICD-10-CM

## 2020-06-10 DIAGNOSIS — R7309 Other abnormal glucose: Secondary | ICD-10-CM

## 2020-06-10 DIAGNOSIS — Z79899 Other long term (current) drug therapy: Secondary | ICD-10-CM

## 2020-06-10 DIAGNOSIS — M5432 Sciatica, left side: Secondary | ICD-10-CM

## 2020-06-10 DIAGNOSIS — M5431 Sciatica, right side: Secondary | ICD-10-CM

## 2020-06-10 MED ORDER — GABAPENTIN 100 MG PO CAPS: ORAL_CAPSULE | ORAL | 0 refills | Status: DC

## 2020-06-10 MED ORDER — TOPIRAMATE 50 MG PO TABS: ORAL_TABLET | ORAL | 1 refills | Status: DC

## 2020-06-10 MED ORDER — DEXAMETHASONE 4 MG PO TABS: ORAL_TABLET | ORAL | 0 refills | Status: DC

## 2020-06-11 LAB — COMPLETE METABOLIC PANEL WITH GFR
AG Ratio: 2 (calc) (ref 1.0–2.5)
ALT: 21 U/L (ref 6–29)
AST: 23 U/L (ref 10–35)
Albumin: 4.6 g/dL (ref 3.6–5.1)
Alkaline phosphatase (APISO): 67 U/L (ref 37–153)
BUN: 25 mg/dL (ref 7–25)
CO2: 25 mmol/L (ref 20–32)
Calcium: 10.1 mg/dL (ref 8.6–10.4)
Chloride: 107 mmol/L (ref 98–110)
Creat: 0.79 mg/dL (ref 0.60–0.88)
GFR, Est African American: 81 mL/min/{1.73_m2} (ref >=60)
GFR, Est Non African American: 70 mL/min/{1.73_m2} (ref >=60)
Globulin: 2.3 g/dL (calc) (ref 1.9–3.7)
Glucose, Bld: 103 mg/dL — ABNORMAL HIGH (ref 65–99)
Potassium: 4.3 mmol/L (ref 3.5–5.3)
Sodium: 141 mmol/L (ref 135–146)
Total Bilirubin: 0.4 mg/dL (ref 0.2–1.2)
Total Protein: 6.9 g/dL (ref 6.1–8.1)

## 2020-06-11 LAB — LIPID PANEL
Cholesterol: 171 mg/dL (ref <200)
HDL: 51 mg/dL (ref >=50)
LDL Cholesterol (Calc): 100 mg/dL (calc) — ABNORMAL HIGH
Non-HDL Cholesterol (Calc): 120 mg/dL (calc) (ref <130)
Total CHOL/HDL Ratio: 3.4 (calc) (ref <5.0)
Triglycerides: 100 mg/dL (ref <150)

## 2020-06-11 LAB — CBC WITH DIFFERENTIAL/PLATELET
Absolute Monocytes: 510 cells/uL (ref 200–950)
Basophils Absolute: 52 cells/uL (ref 0–200)
Basophils Relative: 0.9 %
Eosinophils Absolute: 133 cells/uL (ref 15–500)
Eosinophils Relative: 2.3 %
HCT: 42.5 % (ref 35.0–45.0)
Hemoglobin: 14.3 g/dL (ref 11.7–15.5)
Lymphs Abs: 1937 cells/uL (ref 850–3900)
MCH: 31.4 pg (ref 27.0–33.0)
MCHC: 33.6 g/dL (ref 32.0–36.0)
MCV: 93.2 fL (ref 80.0–100.0)
MPV: 10.4 fL (ref 7.5–12.5)
Monocytes Relative: 8.8 %
Neutro Abs: 3167 cells/uL (ref 1500–7800)
Neutrophils Relative %: 54.6 %
Platelets: 298 10*3/uL (ref 140–400)
RBC: 4.56 10*6/uL (ref 3.80–5.10)
RDW: 13.8 % (ref 11.0–15.0)
Total Lymphocyte: 33.4 %
WBC: 5.8 10*3/uL (ref 3.8–10.8)

## 2020-06-11 LAB — TSH: TSH: 2.05 mIU/L (ref 0.40–4.50)

## 2020-06-11 LAB — HEMOGLOBIN A1C
Hgb A1c MFr Bld: 5.7 % of total Hgb — ABNORMAL HIGH (ref <5.7)
Mean Plasma Glucose: 117 (calc)
eAG (mmol/L): 6.5 (calc)

## 2020-06-11 LAB — MAGNESIUM: Magnesium: 2.2 mg/dL (ref 1.5–2.5)

## 2020-06-11 LAB — INSULIN, RANDOM: Insulin: 44.3 u[IU]/mL — ABNORMAL HIGH

## 2020-06-11 LAB — VITAMIN D 25 HYDROXY (VIT D DEFICIENCY, FRACTURES): Vit D, 25-Hydroxy: 91 ng/mL (ref 30–100)

## 2020-06-11 NOTE — Progress Notes (Signed)
========================================================== -   Test results slightly outside the reference range are not unusual. If there is anything important, I will review this with you,  otherwise it is considered normal test values.  If you have further questions,  please do not hesitate to contact me at the office or via My Chart.  ==========================================================  -  Total Chol = 171 and LDL = 100 - Both  Excellent   - Very low risk for Heart Attack  / Stroke =========================================================  - A1c = 5.7% - Still borderline elevated    - Avoid Sweets, Candy & White Stuff   - Rice, Potatoes, Breads &  Pasta ==========================================================  -  Vitamin D = 91 -  Excellent   - Very low risk for Heart Attack  / Stroke ==========================================================  - All Else - CBC - Kidneys - Electrolytes - Liver - Magnesium & Thyroid    - all  Normal / OK ====================================================   - Keep up the [place] Work ! ==========================================================               l

## 2020-06-18 ENCOUNTER — Telehealth: Payer: Self-pay | Admitting: *Deleted

## 2020-06-18 ENCOUNTER — Other Ambulatory Visit: Payer: Self-pay | Admitting: Internal Medicine

## 2020-06-18 DIAGNOSIS — M5431 Sciatica, right side: Secondary | ICD-10-CM

## 2020-06-18 DIAGNOSIS — M5432 Sciatica, left side: Secondary | ICD-10-CM

## 2020-06-18 MED ORDER — GABAPENTIN 300 MG PO CAPS: ORAL_CAPSULE | ORAL | 0 refills | Status: DC

## 2020-06-18 NOTE — Telephone Encounter (Signed)
Returned call to patient regarding pain In legs. Patient has finished Dexametasone and Gabapentin 100 mg is not helping. Dr Melford Aase sent in an RX for Gabapentin 300 mg and patient

## 2020-06-18 NOTE — Telephone Encounter (Signed)
Returned call regarding pain in legs. Dr Melford Aase sent in a new RX for Gabapentin 300 mg 1 to 2 capsules 3 times a day. Patient is aware.

## 2020-06-25 DIAGNOSIS — M5416 Radiculopathy, lumbar region: Secondary | ICD-10-CM | POA: Diagnosis not present

## 2020-06-25 DIAGNOSIS — M545 Low back pain, unspecified: Secondary | ICD-10-CM | POA: Diagnosis not present

## 2020-06-30 DIAGNOSIS — M5416 Radiculopathy, lumbar region: Secondary | ICD-10-CM | POA: Diagnosis not present

## 2020-06-30 DIAGNOSIS — M48062 Spinal stenosis, lumbar region with neurogenic claudication: Secondary | ICD-10-CM | POA: Diagnosis not present

## 2020-06-30 DIAGNOSIS — M5116 Intervertebral disc disorders with radiculopathy, lumbar region: Secondary | ICD-10-CM | POA: Diagnosis not present

## 2020-07-01 ENCOUNTER — Other Ambulatory Visit: Payer: Self-pay | Admitting: Internal Medicine

## 2020-08-01 ENCOUNTER — Other Ambulatory Visit: Payer: Self-pay | Admitting: Internal Medicine

## 2020-08-01 DIAGNOSIS — M1000 Idiopathic gout, unspecified site: Secondary | ICD-10-CM

## 2020-09-04 DIAGNOSIS — M5416 Radiculopathy, lumbar region: Secondary | ICD-10-CM | POA: Diagnosis not present

## 2020-09-09 DIAGNOSIS — M5416 Radiculopathy, lumbar region: Secondary | ICD-10-CM | POA: Diagnosis not present

## 2020-09-11 DIAGNOSIS — M5416 Radiculopathy, lumbar region: Secondary | ICD-10-CM | POA: Diagnosis not present

## 2020-09-16 ENCOUNTER — Ambulatory Visit (INDEPENDENT_AMBULATORY_CARE_PROVIDER_SITE_OTHER): Payer: Medicare Other | Admitting: Adult Health Nurse Practitioner

## 2020-09-16 ENCOUNTER — Other Ambulatory Visit: Payer: Self-pay

## 2020-09-16 ENCOUNTER — Encounter: Payer: Self-pay | Admitting: Adult Health Nurse Practitioner

## 2020-09-16 VITALS — BP 110/66 | HR 75 | Temp 97.2°F | Resp 16 | Ht 62.0 in | Wt 177.2 lb

## 2020-09-16 DIAGNOSIS — M159 Polyosteoarthritis, unspecified: Secondary | ICD-10-CM | POA: Diagnosis not present

## 2020-09-16 DIAGNOSIS — Z0000 Encounter for general adult medical examination without abnormal findings: Secondary | ICD-10-CM

## 2020-09-16 DIAGNOSIS — R0989 Other specified symptoms and signs involving the circulatory and respiratory systems: Secondary | ICD-10-CM

## 2020-09-16 DIAGNOSIS — E790 Hyperuricemia without signs of inflammatory arthritis and tophaceous disease: Secondary | ICD-10-CM | POA: Diagnosis not present

## 2020-09-16 DIAGNOSIS — Z79899 Other long term (current) drug therapy: Secondary | ICD-10-CM | POA: Diagnosis not present

## 2020-09-16 DIAGNOSIS — E669 Obesity, unspecified: Secondary | ICD-10-CM

## 2020-09-16 DIAGNOSIS — E559 Vitamin D deficiency, unspecified: Secondary | ICD-10-CM

## 2020-09-16 DIAGNOSIS — E66811 Obesity, class 1: Secondary | ICD-10-CM

## 2020-09-16 DIAGNOSIS — E782 Mixed hyperlipidemia: Secondary | ICD-10-CM

## 2020-09-16 DIAGNOSIS — R7309 Other abnormal glucose: Secondary | ICD-10-CM

## 2020-09-16 DIAGNOSIS — E2839 Other primary ovarian failure: Secondary | ICD-10-CM

## 2020-09-16 DIAGNOSIS — R7303 Prediabetes: Secondary | ICD-10-CM

## 2020-09-16 NOTE — Progress Notes (Signed)
MEDICARE ANNUAL WELLNESS VISIT AND FOLLOW UP  Assessment:   Medicare annual wellness visit Yearly  Labile hypertension - continue medications, DASH diet, exercise and monitor at home. Call if greater than 130/80.  -     CBC with Differential/Platelet -     CMP/GFR -     TSH  Prediabetes Discussed general issues about diabetes pathophysiology and management., Educational material distributed., Suggested low cholesterol diet., Encouraged aerobic exercise., Discussed foot care., Reminded to get yearly retinal exam.  Hyperlipademia -continue medications, check lipids, decrease fatty foods, increase activity.  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, had SE with 300 mg Discussed diet/lifestyle; discussed colchicine to hold and start if future gout flares   Estrogen Deficiency DEXA  Medication management Continued  Over 30 minutes of face to face interview, exam, counseling, chart review, and critical decision making was performed  Future Appointments  Date Time Provider Ona  01/20/2021 11:00 AM [name], MD [facility] None  09/16/2021 10:30 AM Garnet Sierras, NP [facility] None    Plan:   During the course of the visit the patient was educated and counseled about appropriate screening and preventive services including:    Pneumococcal vaccine   Influenza vaccine  Td vaccine  Prevnar 13  Screening electrocardiogram  Screening mammography  Bone densitometry screening  Colorectal cancer screening  Diabetes screening  Glaucoma screening  Nutrition counseling   Advanced directives: given info/requested copies   Subjective:   Angelica Romero is a 80 y.o. female who presents for Medicare  Annual Wellness Visit and 3 month follow up on hypertension, prediabetes, hyperlipidemia, vitamin D def and morbid obesity.  She had a laminctomy, facetectomy, foraminotomy 11/22 by Dr Trenton Gammon. Previous had left sided pains, resolved after surgery.  Now she is having right sided pain, pointing to illiac crest . Reports this is baseline for her.  She is doing PT twice a week and also some Dry needling once a week ago. She is taking vicoden s/p surgery.  Next OV is 09/26/19.  She used to exercise at the [facility], has not gone back related to [place].  Husband passed away 11/15/19. She was working with therapist at [facility]. She feels she is doing fairly well, but does have spontaneous episodes of crying. Living by herself now, has cats and dogs to keep her company. Family is close by.   BMI is Body mass index is 32.41 kg/m., she has been working on diet, stops eating after sundown, watching portions. Active around her yard but not intentionally active.  Wt Readings from Last 3 Encounters:  09/16/20 177 lb 3.2 oz (80.4 kg)  06/10/20 178 lb 9.6 oz (81 kg)  03/05/20 178 lb (80.7 kg)   Her blood pressure has been controlled at home, today their BP is BP: 110/66 She does workout. She denies chest pain, shortness of breath, dizziness.   She is not on cholesterol medication and denies myalgias. Her cholesterol is at goal. The cholesterol last visit was:   Lab Results  Component Value Date   CHOL 171 06/10/2020   HDL 51 06/10/2020   LDLCALC 100 (H) 06/10/2020   TRIG 100 06/10/2020   CHOLHDL 3.4 06/10/2020   She reports that she has been working on diet and exercise.  Lab Results  Component Value Date   HGBA1C 5.7 (H) 06/10/2020   Last GFR Lab Results  Component Value Date   GFRNONAA 75 09/16/2020   Patient is on Vitamin D supplement, has started taking regularly, 5000 IU since  Lab Results  Component Value Date   VD25OH 91 06/10/2020     Patient is on allopurinol for gout and does report a recent  flare. She reports  Lab Results  Component Value Date   LABURIC 4.8 03/05/2020      Medication Review Current Outpatient Medications on File Prior to Visit  Medication Sig Dispense Refill  . allopurinol (ZYLOPRIM) 300 MG tablet Take     1 tablet     Daily       to Prevent Gout 90 tablet 0  . Ascorbic Acid (VITAMIN C GUMMIE PO) Take by mouth. Takes 1 gummy daily.    Marland Kitchen aspirin EC 81 MG tablet Take 81 mg by mouth daily. Reported on 07/31/2015    . Cholecalciferol (VITAMIN D PO) Take 10,000 Units by mouth daily.     . colchicine 0.6 MG tablet Take 1 pill daily as needed starting with onset of gout flare. 30 tablet 0  . gabapentin (NEURONTIN) 300 MG capsule Take      1 to 2 capsules        3  x /day       for Back & Nerve Pain 270 capsule 0  . naproxen sodium (ANAPROX) 220 MG tablet Take 220 mg by mouth 2 (two) times daily with a meal. PRN    . OVER THE COUNTER MEDICATION Take 1 Zinc gummy daily.    Marland Kitchen telmisartan (MICARDIS) 80 MG tablet Take      1 tablet      Daily       for BP 90 tablet 0  . topiramate (TOPAMAX) 50 MG tablet Take 1/2 to 1 tablet 2 x /day at Suppertime & Bedtime for Dieting & Weight Loss (Patient not taking: Reported on 09/16/2020) 180 tablet 1   No current facility-administered medications on file prior to visit.    Allergies: Allergies  Allergen Reactions  . Codeine Nausea And Vomiting  . Hydrocodone Nausea And Vomiting  . Lisinopril Cough    cough  . Omeprazole Nausea And Vomiting    Current Problems (verified) has Labile hypertension; Vitamin D deficiency; Encounter for Medicare annual wellness exam; Hyperlipidemia, mixed; DJD (degenerative joint disease), multiple sites; Hyperuricemia; Abnormal glucose; Prediabetes; and Obesity (BMI 30.0-34.9) on their problem list.  Screening Tests Immunization History  Administered Date(s) Administered  . DT (Pediatric) 07/31/2015  . DTaP 06/09/2005  . Fluad Quad(high Dose 65+) 05/11/2019  . Influenza, High Dose Seasonal  PF 04/13/2017, 06/10/2020  . Influenza-Unspecified 04/09/2014, 05/10/2015, 04/22/2016  . Pneumococcal Conjugate-13 07/01/2014  . Pneumococcal-Unspecified 06/10/1999, 06/09/2008  . Zoster 06/10/2007    Preventative care: Last colonoscopy: cologuard - 10/2018 negative - DONE Last mammogram: 02/2020 DEXA: 2013 normal - Order placed, get with mammogram 06/2014 lumbar spine CXR 2007  Prior vaccinations: TD or Tdap: 2016  Influenza: 2021  Pneumococcal: 2009 Prevnar13: 2015 Shingles/Zostavax: 2008 Covid 19: declines, discussed with patient   Names of Other Physician/Practitioners you currently use: 1.  [facility]- here for primary care 2. Dr. Santo Held, eye doctor, last visit 03/2020 3. Dr. Orene Desanctis, dentist, last visit 2021,  q 6 months, going today   Patient Care Team: [name], MD as PCP - General (Internal Medicine) Monna Fam, MD as  Consulting Physician (Ophthalmology) Druscilla Brownie, MD as Consulting Physician (Dermatology)  Surgical: She  has no past surgical history on file. Family Her family history includes Breast cancer in her mother. Social history  She reports that she has never smoked. She has never used smokeless tobacco. She reports that she does not drink alcohol. No history on file for drug use.  MEDICARE WELLNESS OBJECTIVES: Physical activity: Current Exercise Habits: The patient does not participate in regular exercise at present, Exercise limited by: None identified Cardiac risk factors: Cardiac Risk Factors include: advanced age (>54men, >38 women);dyslipidemia;hypertension;sedentary lifestyle Depression/mood screen:   Depression screen [facility] 2/9 09/16/2020  Decreased Interest 0  Down, Depressed, Hopeless 0  PHQ - 2 Score 0    ADLs:  In your present state of health, do you have any difficulty performing the following activities: 09/16/2020 06/09/2020  Hearing? N N  Vision? N N  Difficulty concentrating or  making decisions? N N  Walking or climbing stairs? N N  Dressing or bathing? N N  Doing errands, shopping? N N  Preparing Food and eating ? N -  Using the Toilet? N -  In the past six months, have you accidently leaked urine? N -  Do you have problems with loss of bowel control? N -  Managing your Medications? N -  Managing your Finances? N -  Housekeeping or managing your Housekeeping? - -  Some recent data might be hidden     Cognitive Testing  Alert? Yes  Normal Appearance?Yes  Oriented to person? Yes  Place? Yes   Time? Yes  Recall of three objects?  Yes  Can perform simple calculations? Yes  Displays appropriate judgment?Yes  Can read the correct time from a watch face?Yes  EOL planning: Does Patient Have a Medical Advance Directive?: Yes Type of Advance Directive: [place] will Does patient want to make changes to medical advance directive?: No - Patient declined Copy of [place] in Chart?: No - copy requested   Objective:   Today's Vitals   09/16/20 1059  BP: 110/66  Pulse: 75  Resp: 16  Temp: (!) 97.2 F (36.2 C)  SpO2: 97%  Weight: 177 lb 3.2 oz (80.4 kg)  Height: 5\' 2"  (1.575 m)   Body mass index is 32.41 kg/m.  General appearance: alert, no distress, WD/WN,  female HEENT: normocephalic, sclerae anicteric, TMs pearly, nares patent, no discharge or erythema, pharynx normal Oral cavity: MMM, no lesions Neck: supple, no lymphadenopathy, no thyromegaly, no masses Heart: RRR, normal S1, S2, no murmurs Lungs: CTA bilaterally, no wheezes, rhonchi, or rales Abdomen: +bs, soft, non tender, non distended, no masses, no hepatomegaly, no splenomegaly Musculoskeletal: nontender, no swelling, no obvious deformity Extremities: no edema, no cyanosis, no clubbing Pulses: 2+ symmetric, upper and lower extremities, normal cap refill Neurological: alert, oriented x 3, CN2-12 intact, strength normal upper extremities and lower  extremities, sensation normal throughout, DTRs 2+ throughout, no cerebellar signs, gait normal Psychiatric: normal affect, behavior normal, pleasant  Breast: defer Gyn: defer Rectal: defer   Medicare Attestation I have personally reviewed: The patient's medical and social history Their use of alcohol, tobacco or illicit drugs Their current medications and supplements The patient's functional ability including ADLs,fall risks, home safety risks, cognitive, and hearing and visual impairment Diet and physical activities Evidence for depression or mood disorders  The patient's weight, height, BMI, and visual acuity have been recorded in the chart.  I have made referrals, counseling, and provided education  to the patient based on review of the above and I have provided the patient with a written personalized care plan for preventive services.     Garnet Sierras, NP   09/21/2020

## 2020-09-17 DIAGNOSIS — M5416 Radiculopathy, lumbar region: Secondary | ICD-10-CM | POA: Diagnosis not present

## 2020-09-17 LAB — CBC WITH DIFFERENTIAL/PLATELET
Absolute Monocytes: 666 cells/uL (ref 200–950)
Basophils Absolute: 51 cells/uL (ref 0–200)
Basophils Relative: 0.8 %
Eosinophils Absolute: 154 cells/uL (ref 15–500)
Eosinophils Relative: 2.4 %
HCT: 40.9 % (ref 35.0–45.0)
Hemoglobin: 13.5 g/dL (ref 11.7–15.5)
Lymphs Abs: 2835 cells/uL (ref 850–3900)
MCH: 30.7 pg (ref 27.0–33.0)
MCHC: 33 g/dL (ref 32.0–36.0)
MCV: 93 fL (ref 80.0–100.0)
MPV: 10.3 fL (ref 7.5–12.5)
Monocytes Relative: 10.4 %
Neutro Abs: 2694 cells/uL (ref 1500–7800)
Neutrophils Relative %: 42.1 %
Platelets: 280 10*3/uL (ref 140–400)
RBC: 4.4 10*6/uL (ref 3.80–5.10)
RDW: 14 % (ref 11.0–15.0)
Total Lymphocyte: 44.3 %
WBC: 6.4 10*3/uL (ref 3.8–10.8)

## 2020-09-17 LAB — COMPLETE METABOLIC PANEL WITH GFR
AG Ratio: 1.6 (calc) (ref 1.0–2.5)
ALT: 13 U/L (ref 6–29)
AST: 19 U/L (ref 10–35)
Albumin: 4.2 g/dL (ref 3.6–5.1)
Alkaline phosphatase (APISO): 73 U/L (ref 37–153)
BUN: 18 mg/dL (ref 7–25)
CO2: 25 mmol/L (ref 20–32)
Calcium: 9.9 mg/dL (ref 8.6–10.4)
Chloride: 106 mmol/L (ref 98–110)
Creat: 0.75 mg/dL (ref 0.60–0.88)
GFR, Est African American: 87 mL/min/{1.73_m2} (ref >=60)
GFR, Est Non African American: 75 mL/min/{1.73_m2} (ref >=60)
Globulin: 2.6 g/dL (calc) (ref 1.9–3.7)
Glucose, Bld: 84 mg/dL (ref 65–99)
Potassium: 4.2 mmol/L (ref 3.5–5.3)
Sodium: 139 mmol/L (ref 135–146)
Total Bilirubin: 0.4 mg/dL (ref 0.2–1.2)
Total Protein: 6.8 g/dL (ref 6.1–8.1)

## 2020-10-09 DIAGNOSIS — M5416 Radiculopathy, lumbar region: Secondary | ICD-10-CM | POA: Diagnosis not present

## 2020-10-09 DIAGNOSIS — M545 Low back pain, unspecified: Secondary | ICD-10-CM | POA: Diagnosis not present

## 2020-10-10 ENCOUNTER — Other Ambulatory Visit: Payer: Self-pay | Admitting: Internal Medicine

## 2020-10-14 DIAGNOSIS — M5416 Radiculopathy, lumbar region: Secondary | ICD-10-CM | POA: Diagnosis not present

## 2020-10-27 ENCOUNTER — Other Ambulatory Visit: Payer: Self-pay | Admitting: Adult Health

## 2020-10-27 DIAGNOSIS — E790 Hyperuricemia without signs of inflammatory arthritis and tophaceous disease: Secondary | ICD-10-CM

## 2020-10-28 DIAGNOSIS — M5416 Radiculopathy, lumbar region: Secondary | ICD-10-CM | POA: Diagnosis not present

## 2020-11-20 DIAGNOSIS — M5416 Radiculopathy, lumbar region: Secondary | ICD-10-CM | POA: Diagnosis not present

## 2020-11-20 DIAGNOSIS — M48061 Spinal stenosis, lumbar region without neurogenic claudication: Secondary | ICD-10-CM | POA: Diagnosis not present

## 2020-11-20 DIAGNOSIS — M47816 Spondylosis without myelopathy or radiculopathy, lumbar region: Secondary | ICD-10-CM | POA: Diagnosis not present

## 2020-12-04 ENCOUNTER — Encounter: Payer: Medicare Other | Admitting: Adult Health

## 2020-12-23 ENCOUNTER — Other Ambulatory Visit: Payer: Self-pay | Admitting: Adult Health

## 2020-12-23 ENCOUNTER — Other Ambulatory Visit: Payer: Self-pay | Admitting: Internal Medicine

## 2020-12-23 DIAGNOSIS — E2839 Other primary ovarian failure: Secondary | ICD-10-CM

## 2020-12-23 DIAGNOSIS — Z1231 Encounter for screening mammogram for malignant neoplasm of breast: Secondary | ICD-10-CM

## 2020-12-29 DIAGNOSIS — M47816 Spondylosis without myelopathy or radiculopathy, lumbar region: Secondary | ICD-10-CM | POA: Diagnosis not present

## 2021-01-19 ENCOUNTER — Encounter: Payer: Self-pay | Admitting: Internal Medicine

## 2021-01-19 ENCOUNTER — Other Ambulatory Visit: Payer: Self-pay | Admitting: Internal Medicine

## 2021-01-19 DIAGNOSIS — M1000 Idiopathic gout, unspecified site: Secondary | ICD-10-CM

## 2021-01-19 NOTE — Progress Notes (Signed)
Annual Screening/Preventative Visit & Comprehensive Evaluation &  Examination  Future Appointments  Date Time Provider [place]  01/20/2021 11:00 AM [name], MD [facility] None  09/16/2021 10:30 AM Garnet Sierras, NP [facility] None  01/20/2022  2:00 PM [name], MD [facility] None        This very nice 80 y.o. [name] presents for a Screening /Preventative Visit & comprehensive evaluation and management of multiple medical co-morbidities.  Patient has been followed for HTN, HLD, Prediabetes  and Vitamin D Deficiency.  Patient has Gout controlled on her meds. 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        HTN predates circa 2016. Patient's BP has been controlled at home and patient denies any cardiac symptoms as chest pain, palpitations, shortness of breath, dizziness or ankle swelling. Today's BP is at goal - 112/68        Patient's hyperlipidemia is controlled with diet.  Last lipids were at goal:  Lab Results  Component Value Date   CHOL 171 06/10/2020   HDL 51 06/10/2020   LDLCALC 100 (H) 06/10/2020   TRIG 100 06/10/2020   CHOLHDL 3.4 06/10/2020         Patient has  Morbid Obesity (BMI 35+) and hx/o prediabetes (A1c 5.9% /2016) and patient denies reactive hypoglycemic symptoms, visual blurring, diabetic polys or paresthesias. Last A1c was near goal:  Lab Results  Component Value Date   HGBA1C 5.7 (H) 06/10/2020         Finally, patient has history of Vitamin D Deficiency ("33"/2016) and last Vitamin D was at goal:  Lab Results  Component Value Date   VD25OH 91 06/10/2020     Current Outpatient Medications on File Prior to Visit  Medication Sig   allopurinol 300 MG tablet TAKE 1 TABLET DAILY    VITAMIN C GUMMIE  Take 1 gummy daily.   aspirin EC 81 MG tablet Take daily.    VITAMIN D  10,000 Units Take daily.    colchicine 0.6 MG tablet TAKE 1 TABLET  DAILY    telmisartan  80 MG tablet Take 1 tablet  daily for  blood pressure   topiramate (TOPAMAX) 50 MG tablet Take 1/2 to 1 tablet 2 x /day at Suppertime & Bedtime for Dieting & Weight Loss      Allergies  Allergen Reactions   Codeine Nausea And Vomiting   Hydrocodone Nausea And Vomiting   Lisinopril Cough   Omeprazole Nausea And Vomiting     Past Medical History:  Diagnosis Date   Elevated BP    Prediabetes    Vitamin D deficiency      Health Maintenance  Topic Date Due   COVID-19 Vaccine (1) Never done   Zoster Vaccines- Shingrix (1 of 2) Never done   INFLUENZA VACCINE  03/09/2021   TETANUS/TDAP  07/30/2025   DEXA SCAN  Completed   PNA vac Low Risk Adult  Completed   HPV VACCINES  Aged Out     Immunization History  Administered Date(s) Administered   DT (Pediatric) 07/31/2015   DTaP 06/09/2005   Fluad Quad(high Dose 65+) 05/11/2019   Influenza, High Dose Seasonal PF 04/13/2017, 06/10/2020   Influenza-Unspecified 04/09/2014, 05/10/2015, 04/22/2016   Pneumococcal Conjugate-13 07/01/2014   Pneumococcal-Unspecified 06/10/1999, 06/09/2008   Zoster, Live 06/10/2007    Cologuard - 10/2018 negative    Last MGM - 02/08/2020  & scheduled 06/02/2021   No past surgical history on file.   Family History  Problem Relation Age of Onset   Breast cancer Mother      Social History   Tobacco Use   Smoking status: Never   Smokeless tobacco: Never  Substance Use Topics   Alcohol use: No    Alcohol/week: 0.0 standard drinks      ROS Constitutional: Denies fever, chills, weight loss/gain, headaches, insomnia,  night sweats, and change in appetite. Does c/o fatigue. Eyes: Denies redness, blurred vision, diplopia, discharge, itchy, watery eyes.  ENT: Denies discharge, congestion, post nasal drip, epistaxis, sore throat, earache, hearing loss, dental pain, Tinnitus, Vertigo, Sinus pain, snoring.  Cardio: Denies chest pain, palpitations, irregular heartbeat, syncope, dyspnea, diaphoresis, orthopnea, PND, claudication,  edema Respiratory: denies cough, dyspnea, DOE, pleurisy, hoarseness, laryngitis, wheezing.  Gastrointestinal: Denies dysphagia, heartburn, reflux, water brash, pain, cramps, nausea, vomiting, bloating, diarrhea, constipation, hematemesis, melena, hematochezia, jaundice, hemorrhoids Genitourinary: Denies dysuria, frequency, urgency, nocturia, hesitancy, discharge, hematuria, flank pain Breast: Breast lumps, nipple discharge, bleeding.  Musculoskeletal: Denies arthralgia, myalgia, stiffness, Jt. Swelling, pain, limp, and strain/sprain. Denies falls. Skin: Denies puritis, rash, hives, warts, acne, eczema, changing in skin lesion Neuro: No weakness, tremor, incoordination, spasms, paresthesia, pain Psychiatric: Denies confusion, memory loss, sensory loss. Denies Depression. Endocrine: Denies change in weight, skin, hair change, nocturia, and paresthesia, diabetic polys, visual blurring, hyper / hypo glycemic episodes.  Heme/Lymph: No excessive bleeding, bruising, enlarged lymph nodes.  Physical Exam  BP 112/68   Pulse 82   Temp 97.7 F (36.5 C)   Resp 17   Ht 5\' 2"  (1.575 m)   Wt 168 lb 9.6 oz (76.5 kg)   SpO2 97%   BMI 30.84 kg/m   General Appearance: Over nourished, well groomed and in no apparent distress.  Eyes: PERRLA, EOMs, conjunctiva no swelling or erythema, normal fundi and vessels. Sinuses: No frontal/maxillary tenderness ENT/Mouth: EACs patent / TMs  nl. Nares clear without erythema, swelling, mucoid exudates. Oral hygiene is good. No erythema, swelling, or exudate. Tongue normal, non-obstructing. Tonsils not swollen or erythematous. Hearing normal.  Neck: Supple, thyroid not palpable. No bruits, nodes or JVD. Respiratory: Respiratory effort normal.  BS equal and clear bilateral without rales, rhonci, wheezing or stridor. Cardio: Heart sounds are normal with regular rate and rhythm and no murmurs, rubs or gallops. Peripheral pulses are normal and equal bilaterally without  edema. No aortic or femoral bruits. Chest: symmetric with normal excursions and percussion. Breasts: Symmetric, without lumps, nipple discharge, retractions, or fibrocystic changes.  Abdomen: Flat, soft with bowel sounds active. Nontender, no guarding, rebound, hernias, masses, or organomegaly.  Lymphatics: Non tender without lymphadenopathy.  Genitourinary:  Musculoskeletal: Full ROM all peripheral extremities, joint stability, 5/5 strength, and normal gait. Skin: Warm and dry without rashes, lesions, cyanosis, clubbing or  ecchymosis.  Neuro: Cranial nerves intact, reflexes equal bilaterally. Normal muscle tone, no cerebellar symptoms. Sensation intact.  Pysch: Alert and oriented X 3, normal affect, Insight and Judgment appropriate.    Assessment and Plan  1. Annual Preventative Screening Examination  2. Labile hypertension  - EKG 12-Lead - Urinalysis, Routine w reflex microscopic - Microalbumin / creatinine urine ratio - CBC with Differential/Platelet - COMPLETE METABOLIC PANEL WITH GFR - Magnesium - TSH  3. Hyperlipidemia, mixed  - EKG 12-Lead - Lipid panel - TSH  4. Abnormal glucose  - EKG 12-Lead - Hemoglobin A1c - Insulin, random  5. Vitamin D deficiency  - VITAMIN D 25 Hydroxy   6. Idiopathic chronic gout   - Uric acid  7. Osteoarthritis of multiple joints  8. Class 2 severe obesity due to excess calories with serious comorbidity  and body mass index (BMI) of 35.0 to 35.9 in adult (HCC)  - TSH  9. Screening for colorectal cancer  - POC Hemoccult Bld/Stl  10. Screening for ischemic heart disease  - EKG 12-Lead  11. Medication management  - Urinalysis, Routine w reflex microscopic - Microalbumin / creatinine urine ratio - CBC with Differential/Platelet - COMPLETE METABOLIC PANEL WITH GFR - Magnesium - Lipid panel - TSH - Hemoglobin A1c - Insulin, random - VITAMIN D 25 Hydroxy          Patient was counseled in prudent diet to  achieve/maintain BMI less than 25 for weight control, BP monitoring, regular exercise and medications. Discussed med's effects and SE's. Screening labs and tests as requested with regular follow-up as recommended. Over 40 minutes of exam, counseling, chart review and high complex critical decision making was performed.   Kirtland Bouchard, MD

## 2021-01-19 NOTE — Patient Instructions (Signed)

## 2021-01-20 ENCOUNTER — Other Ambulatory Visit: Payer: Self-pay

## 2021-01-20 ENCOUNTER — Ambulatory Visit (INDEPENDENT_AMBULATORY_CARE_PROVIDER_SITE_OTHER): Payer: Medicare Other | Admitting: Internal Medicine

## 2021-01-20 ENCOUNTER — Encounter: Payer: Self-pay | Admitting: Internal Medicine

## 2021-01-20 VITALS — BP 112/68 | HR 82 | Temp 97.7°F | Resp 17 | Ht 62.0 in | Wt 168.6 lb

## 2021-01-20 DIAGNOSIS — M1A00X0 Idiopathic chronic gout, unspecified site, without tophus (tophi): Secondary | ICD-10-CM

## 2021-01-20 DIAGNOSIS — R0989 Other specified symptoms and signs involving the circulatory and respiratory systems: Secondary | ICD-10-CM

## 2021-01-20 DIAGNOSIS — R7309 Other abnormal glucose: Secondary | ICD-10-CM | POA: Diagnosis not present

## 2021-01-20 DIAGNOSIS — E782 Mixed hyperlipidemia: Secondary | ICD-10-CM

## 2021-01-20 DIAGNOSIS — Z1211 Encounter for screening for malignant neoplasm of colon: Secondary | ICD-10-CM

## 2021-01-20 DIAGNOSIS — M47816 Spondylosis without myelopathy or radiculopathy, lumbar region: Secondary | ICD-10-CM | POA: Diagnosis not present

## 2021-01-20 DIAGNOSIS — M159 Polyosteoarthritis, unspecified: Secondary | ICD-10-CM

## 2021-01-20 DIAGNOSIS — Z0000 Encounter for general adult medical examination without abnormal findings: Secondary | ICD-10-CM

## 2021-01-20 DIAGNOSIS — Z79899 Other long term (current) drug therapy: Secondary | ICD-10-CM

## 2021-01-20 DIAGNOSIS — E559 Vitamin D deficiency, unspecified: Secondary | ICD-10-CM

## 2021-01-20 DIAGNOSIS — Z0001 Encounter for general adult medical examination with abnormal findings: Secondary | ICD-10-CM

## 2021-01-20 DIAGNOSIS — M48061 Spinal stenosis, lumbar region without neurogenic claudication: Secondary | ICD-10-CM | POA: Diagnosis not present

## 2021-01-20 DIAGNOSIS — M5416 Radiculopathy, lumbar region: Secondary | ICD-10-CM | POA: Diagnosis not present

## 2021-01-20 DIAGNOSIS — Z136 Encounter for screening for cardiovascular disorders: Secondary | ICD-10-CM | POA: Diagnosis not present

## 2021-01-20 DIAGNOSIS — Z6835 Body mass index (BMI) 35.0-35.9, adult: Secondary | ICD-10-CM

## 2021-01-20 DIAGNOSIS — E66812 Obesity, class 2: Secondary | ICD-10-CM

## 2021-01-20 MED ORDER — DEXAMETHASONE 4 MG PO TABS: ORAL_TABLET | ORAL | 0 refills | Status: DC

## 2021-01-20 MED ORDER — BENZONATATE 200 MG PO CAPS: ORAL_CAPSULE | ORAL | 1 refills | Status: DC

## 2021-01-20 NOTE — Addendum Note (Signed)
Addended by: [name] on: 01/20/2021 01:00 PM   Modules accepted: Orders

## 2021-01-21 LAB — COMPLETE METABOLIC PANEL WITH GFR
AG Ratio: 1.5 (calc) (ref 1.0–2.5)
ALT: 22 U/L (ref 6–29)
AST: 22 U/L (ref 10–35)
Albumin: 4.3 g/dL (ref 3.6–5.1)
Alkaline phosphatase (APISO): 68 U/L (ref 37–153)
BUN: 18 mg/dL (ref 7–25)
CO2: 24 mmol/L (ref 20–32)
Calcium: 10.3 mg/dL (ref 8.6–10.4)
Chloride: 98 mmol/L (ref 98–110)
Creat: 0.85 mg/dL (ref 0.60–0.88)
GFR, Est African American: 74 mL/min/{1.73_m2} (ref >=60)
GFR, Est Non African American: 64 mL/min/{1.73_m2} (ref >=60)
Globulin: 2.8 g/dL (calc) (ref 1.9–3.7)
Glucose, Bld: 95 mg/dL (ref 65–99)
Potassium: 4.6 mmol/L (ref 3.5–5.3)
Sodium: 133 mmol/L — ABNORMAL LOW (ref 135–146)
Total Bilirubin: 0.6 mg/dL (ref 0.2–1.2)
Total Protein: 7.1 g/dL (ref 6.1–8.1)

## 2021-01-21 LAB — URINALYSIS, ROUTINE W REFLEX MICROSCOPIC
Bacteria, UA: NONE SEEN /HPF
Bilirubin Urine: NEGATIVE
Glucose, UA: NEGATIVE
Hgb urine dipstick: NEGATIVE
Hyaline Cast: NONE SEEN /LPF
Nitrite: NEGATIVE
Protein, ur: NEGATIVE
RBC / HPF: NONE SEEN /HPF (ref 0–2)
Specific Gravity, Urine: 1.014 (ref 1.001–1.035)
pH: 5.5 (ref 5.0–8.0)

## 2021-01-21 LAB — CBC WITH DIFFERENTIAL/PLATELET
Absolute Monocytes: 785 cells/uL (ref 200–950)
Basophils Absolute: 39 cells/uL (ref 0–200)
Basophils Relative: 0.5 %
Eosinophils Absolute: 146 cells/uL (ref 15–500)
Eosinophils Relative: 1.9 %
HCT: 39.9 % (ref 35.0–45.0)
Hemoglobin: 13.6 g/dL (ref 11.7–15.5)
Lymphs Abs: 2102 cells/uL (ref 850–3900)
MCH: 31.6 pg (ref 27.0–33.0)
MCHC: 34.1 g/dL (ref 32.0–36.0)
MCV: 92.8 fL (ref 80.0–100.0)
MPV: 10.4 fL (ref 7.5–12.5)
Monocytes Relative: 10.2 %
Neutro Abs: 4628 cells/uL (ref 1500–7800)
Neutrophils Relative %: 60.1 %
Platelets: 281 10*3/uL (ref 140–400)
RBC: 4.3 10*6/uL (ref 3.80–5.10)
RDW: 14.2 % (ref 11.0–15.0)
Total Lymphocyte: 27.3 %
WBC: 7.7 10*3/uL (ref 3.8–10.8)

## 2021-01-21 LAB — MAGNESIUM: Magnesium: 2.2 mg/dL (ref 1.5–2.5)

## 2021-01-21 LAB — LIPID PANEL
Cholesterol: 152 mg/dL (ref <200)
HDL: 62 mg/dL (ref >=50)
LDL Cholesterol (Calc): 77 mg/dL (calc)
Non-HDL Cholesterol (Calc): 90 mg/dL (calc) (ref <130)
Total CHOL/HDL Ratio: 2.5 (calc) (ref <5.0)
Triglycerides: 58 mg/dL (ref <150)

## 2021-01-21 LAB — HEMOGLOBIN A1C
Hgb A1c MFr Bld: 5.6 % of total Hgb (ref <5.7)
Mean Plasma Glucose: 114 mg/dL
eAG (mmol/L): 6.3 mmol/L

## 2021-01-21 LAB — INSULIN, RANDOM: Insulin: 11.1 u[IU]/mL

## 2021-01-21 LAB — MICROALBUMIN / CREATININE URINE RATIO
Creatinine, Urine: 79 mg/dL (ref 20–275)
Microalb Creat Ratio: 9 mcg/mg creat (ref <30)
Microalb, Ur: 0.7 mg/dL

## 2021-01-21 LAB — VITAMIN D 25 HYDROXY (VIT D DEFICIENCY, FRACTURES): Vit D, 25-Hydroxy: 85 ng/mL (ref 30–100)

## 2021-01-21 LAB — TSH: TSH: 1.07 mIU/L (ref 0.40–4.50)

## 2021-01-21 LAB — URIC ACID: Uric Acid, Serum: 3.9 mg/dL (ref 2.5–7.0)

## 2021-01-21 LAB — MICROSCOPIC MESSAGE

## 2021-01-21 NOTE — Progress Notes (Signed)
============================================================ -   Test results slightly outside the reference range are not unusual. If there is anything important, I will review this with you,  otherwise it is considered normal test values.  If you have further questions,  please do not hesitate to contact me at the office or via My Chart.  ============================================================ ============================================================  -  U/A suspicious for UTI - Will request urine culture and                                                                may take 3-4 days to get final result ============================================================ ============================================================  - Uric acid / gout test is Normal  - Please continue Allopurinol  ============================================================ ============================================================  - A1c is back in Normal NonDiabetic range - Great ! ============================================================ ============================================================  - All Else - CBC - Kidneys - Electrolytes - Liver - Magnesium & Thyroid    - all  Normal / OK ============================================================

## 2021-01-22 ENCOUNTER — Other Ambulatory Visit: Payer: Self-pay | Admitting: Internal Medicine

## 2021-01-22 DIAGNOSIS — N3000 Acute cystitis without hematuria: Secondary | ICD-10-CM | POA: Diagnosis not present

## 2021-01-22 MED ORDER — AZITHROMYCIN 250 MG PO TABS: ORAL_TABLET | ORAL | 0 refills | Status: DC

## 2021-01-23 LAB — URINE CULTURE
MICRO NUMBER:: 12016394
Result:: NO GROWTH
SPECIMEN QUALITY:: ADEQUATE

## 2021-01-24 NOTE — Progress Notes (Signed)
============================================================ ============================================================  -    Urine Culture returned -- >>  No Infection   -  Great   ============================================================ ============================================================

## 2021-02-03 DIAGNOSIS — M47816 Spondylosis without myelopathy or radiculopathy, lumbar region: Secondary | ICD-10-CM | POA: Diagnosis not present

## 2021-02-18 ENCOUNTER — Ambulatory Visit: Payer: Medicare Other

## 2021-02-18 DIAGNOSIS — I10 Essential (primary) hypertension: Secondary | ICD-10-CM | POA: Diagnosis not present

## 2021-02-18 DIAGNOSIS — M47816 Spondylosis without myelopathy or radiculopathy, lumbar region: Secondary | ICD-10-CM | POA: Diagnosis not present

## 2021-02-23 NOTE — Progress Notes (Signed)
Assessment and Plan: Angelica Romero was seen today for otalgia.  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 Take 1 capsule 3 x /day for infection       -  Use Zyrtec and Flonase daily for 3 weeks as this most likely started due to allergies, If symptoms do not improve in the next 10 days she is to call the office.     Further disposition pending results of labs. Discussed med's effects and SE's.   Over 30 minutes of exam, counseling, chart review, and critical decision making was performed.   Future Appointments  Date Time Provider [place]  06/02/2021  3:00 PM [facility] MM 2 [facility]  06/02/2021  3:30 PM [facility] DX DEXA 1 [facility]  09/17/2021  9:30 AM Angelica Comber, NP [facility] None  01/20/2022  2:00 PM [name], MD [facility] None    ------------------------------------------------------------------------------------------------------------------   HPI BP 126/78   Pulse 69   Temp (!) 97 F (36.1 C)   Resp 16   Wt 170 lb (77.1 kg)   SpO2 97%   BMI 31.09 kg/m  80 y.o.female presents for persistent right ear pain and swollen lymph nodes x 1 month.  Has a yellow nasal drainage and has post nasal drip with sore throat. Complains of swollen lymph node on right side of neck.  Tender to the touch.  Has appt to see ear doctor on 03/11/2021.  Has associated head tenderness and some jaw pain.    Patient continues to have back pain and is due to have nerve ablation next month.   Past Medical History:  Diagnosis Date   Elevated BP    Prediabetes    Vitamin D deficiency      Allergies  Allergen Reactions   Codeine Nausea And Vomiting   Hydrocodone Nausea And Vomiting   Lisinopril Cough    cough   Omeprazole Nausea And Vomiting    Current Outpatient Medications on File Prior to Visit   Medication Sig   allopurinol (ZYLOPRIM) 300 MG tablet TAKE 1 TABLET BY MOUTH ONCE DAILY TO  PREVENT  GOUT   Ascorbic Acid (VITAMIN C GUMMIE PO) Take by mouth. Takes 1 gummy daily.   aspirin EC 81 MG tablet Take 81 mg by mouth daily. Reported on 07/31/2015   Cholecalciferol (VITAMIN D PO) Take 10,000 Units by mouth daily.    colchicine 0.6 MG tablet TAKE 1 TABLET BY MOUTH ONCE DAILY AS NEEDED(STARTING WITH ONSET OF GOUT FLARE)   telmisartan (MICARDIS) 80 MG tablet Take 1 tablet by mouth once daily for blood pressure   topiramate (TOPAMAX) 50 MG tablet Take 1/2 to 1 tablet 2 x /day at Suppertime & Bedtime for Dieting & Weight Loss   No current facility-administered medications on file prior to visit.    ROS: all negative except above.   Physical Exam:  BP 126/78   Pulse 69   Temp (!) 97 F (36.1 C)   Resp 16   Wt 170 lb (77.1 kg)   SpO2 97%   BMI 31.09 kg/m   General Appearance: Well nourished, in no apparent distress. Eyes: PERRLA, EOMs, conjunctiva no swelling or erythema Sinuses: No Frontal/maxillary tenderness ENT/Mouth: Ext aud canals clear, Left TM WNL, Right TM dull and retracted, Post nasal drainage noted. Some whiteness  noted on tongue but no thrush patches, possibly coffee Neck: Supple, thyroid normal.  Respiratory: Respiratory effort normal, BS equal bilaterally without rales, rhonchi, wheezing or stridor.  Cardio: RRR with no MRGs. Brisk peripheral pulses without edema.  Abdomen: Soft, + BS.  Non tender, no guarding, rebound, hernias, masses. Lymphatics: right submandibular node noted, tender to touch Musculoskeletal: Full ROM, 5/5 strength, normal gait.  Skin: Warm, dry without rashes, lesions, ecchymosis.  Neuro: Cranial nerves intact. Normal muscle tone, no cerebellar symptoms. Sensation intact.  Psych: Awake and oriented X 3, normal affect, Insight and Judgment appropriate.     Angelica Bernheim, NP 11:29 AM [facility]

## 2021-02-24 ENCOUNTER — Encounter: Payer: Self-pay | Admitting: Nurse Practitioner

## 2021-02-24 ENCOUNTER — Ambulatory Visit (INDEPENDENT_AMBULATORY_CARE_PROVIDER_SITE_OTHER): Payer: Medicare Other | Admitting: Nurse Practitioner

## 2021-02-24 ENCOUNTER — Other Ambulatory Visit: Payer: Self-pay

## 2021-02-24 VITALS — BP 126/78 | HR 69 | Temp 97.0°F | Resp 16 | Wt 170.0 lb

## 2021-02-24 DIAGNOSIS — H65114 Acute and subacute allergic otitis media (mucoid) (sanguinous) (serous), recurrent, right ear: Secondary | ICD-10-CM

## 2021-02-24 MED ORDER — AMOXICILLIN 500 MG PO CAPS: 500.0000 mg | ORAL_CAPSULE | Freq: Three times a day (TID) | ORAL | 0 refills | Status: AC

## 2021-02-24 NOTE — Patient Instructions (Addendum)
Please use Amoxicillin 500mg  three times a day for 7 days.  Also use allergy pill(Clartitin or Zyrtec) daily for 3 weeks and Flonase daily for 3 weeks.  If symptoms are not improving please call the office  Otitis Media, Adult  Otitis media is a condition in which the middle ear is red and swollen (inflamed) and full of fluid. The middle ear is the part of the ear that contains bones for hearing as well as air that helps send sounds to the brain. The conditionusually goes away on its own. What are the causes? This condition is caused by a blockage in the eustachian tube. The eustachian tube connects the middle ear to the back of the nose. It normally allows air into the middle ear. The blockage is caused by fluid or swelling. Problems that can cause blockage include: A cold or infection that affects the nose, mouth, or throat. Allergies. An irritant, such as tobacco smoke. Adenoids that have become large. The adenoids are soft tissue located in the back of the throat, behind the nose and the roof of the mouth. Growth or swelling in the upper part of the throat, just behind the nose (nasopharynx). Damage to the ear caused by change in pressure. This is called barotrauma. What are the signs or symptoms? Symptoms of this condition include: Ear pain. Fever. Problems with hearing. Being tired. Fluid leaking from the ear. Ringing in the ear. How is this treated? This condition can go away on its own within 3-5 days. But if the condition is caused by bacteria or does not go away on its own, or if it keeps coming back, your doctor may: Give you antibiotic medicines. Give you medicines for pain. Follow these instructions at home: Take over-the-counter and prescription medicines only as told by your doctor. If you were prescribed an antibiotic medicine, take it as told by your doctor. Do not stop taking the antibiotic even if you start to feel better. Keep all follow-up visits as told by your  doctor. This is important. Contact a doctor if: You have bleeding from your nose. There is a lump on your neck. You are not feeling better in 5 days. You feel worse instead of better. Get help right away if: You have pain that is not helped with medicine. You have swelling, redness, or pain around your ear. You get a stiff neck. You cannot move part of your face (paralysis). You notice that the bone behind your ear hurts when you touch it. You get a very bad headache. Summary Otitis media means that the middle ear is red, swollen, and full of fluid. This condition usually goes away on its own. If the problem does not go away, treatment may be needed. You may be given medicines to treat the infection or to treat your pain. If you were prescribed an antibiotic medicine, take it as told by your doctor. Do not stop taking the antibiotic even if you start to feel better. Keep all follow-up visits as told by your doctor. This is important. This information is not intended to replace advice given to you by your health care provider. Make sure you discuss any questions you have with your healthcare provider. Document Revised: 06/28/2019 Document Reviewed: 06/28/2019 Elsevier Patient Education  2022 [place]. Allergic Rhinitis, Adult Allergic rhinitis is a reaction to allergens. Allergens are things that can cause an allergic reaction. This condition affects the lining inside the nose (mucous membrane). There are two types of allergic rhinitis: Seasonal.  This type is also called hay fever. It happens only during some times of the year. Perennial. This type can happen at any time of the year. This condition cannot be spread from person to person (is not contagious). It can be mild, worse, or very bad. It can develop at any age and may beoutgrown. What are the causes? This condition may be caused by: Pollen from grasses, trees, and weeds. Dust mites. Smoke. Mold. Car fumes. The pee  (urine), spit, or dander of pets. Dander is dead skin cells from a pet. What increases the risk? You are more likely to develop this condition if: You have allergies in your family. You have problems like allergies in your family. You may have: Swelling of parts of your eyes and eyelids. Asthma. This affects how you breathe. Long-term redness and swelling on your skin. Food allergies. What are the signs or symptoms? The main symptom of this condition is a runny or stuffy nose (nasal congestion). Other symptoms may include: Sneezing or coughing. Itching and tearing of your eyes. Mucus that drips down the back of your throat (postnasal drip). Trouble sleeping. Feeling tired. Headache. Sore throat. How is this treated? There is no cure for this condition. You should avoid things that you are allergic to. Treatment can help to relieve symptoms. This may include: Medicines that block allergy symptoms, such as corticosteroids or antihistamines. These may be given as a shot, nasal spray, or pill. Avoiding things you are allergic to. Medicines that give you bits of what you are allergic to over time. This is called immunotherapy. It is done if other treatments do not help. You may get: Shots. Medicine under your tongue. Stronger medicines, if other treatments do not help. Follow these instructions at home: Avoiding allergens Find out what things you are allergic to and avoid them. To do this, try these things: If you get allergies any time of year: Replace carpet with wood, tile, or vinyl flooring. Carpet can trap pet dander and dust. Do not smoke. Do not allow smoking in your home. Change your heating and air conditioning filters at least once a month. If you get allergies only some times of the year: Keep windows closed when you can. Plan things to do outside when pollen counts are lowest. Check pollen counts before you plan things to do outside. When you come indoors, change your  clothes and shower before you sit on furniture or bedding. If you are allergic to a pet: Keep the pet out of your bedroom. Vacuum, sweep, and dust often.  General instructions Take over-the-counter and prescription medicines only as told by your doctor. Drink enough fluid to keep your pee (urine) pale yellow. Keep all follow-up visits as told by your doctor. This is important. Where to find more information American Academy of Allergy, Asthma & Immunology: www.aaaai.org Contact a doctor if: You have a fever. You get a cough that does not go away. You make whistling sounds when you breathe (wheeze). Your symptoms slow you down. Your symptoms stop you from doing your normal things each day. Get help right away if: You are short of breath. This symptom may be an emergency. Do not wait to see if the symptom will go away. Get medical help right away. Call your local emergency services (911 in the [place] Do not drive yourself to the hospital. Summary Allergic rhinitis may be treated by taking medicines and avoiding things you are allergic to. If you have allergies only some of the  year, keep windows closed when you can at those times. Contact your doctor if you get a fever or a cough that does not go away. This information is not intended to replace advice given to you by your health care provider. Make sure you discuss any questions you have with your healthcare provider. Document Revised: 09/17/2019 Document Reviewed: 07/24/2019 Elsevier Patient Education  2022 [employer].

## 2021-03-12 DIAGNOSIS — M79672 Pain in left foot: Secondary | ICD-10-CM | POA: Diagnosis not present

## 2021-03-12 DIAGNOSIS — L600 Ingrowing nail: Secondary | ICD-10-CM | POA: Diagnosis not present

## 2021-03-16 DIAGNOSIS — M47816 Spondylosis without myelopathy or radiculopathy, lumbar region: Secondary | ICD-10-CM | POA: Diagnosis not present

## 2021-03-26 DIAGNOSIS — L600 Ingrowing nail: Secondary | ICD-10-CM | POA: Diagnosis not present

## 2021-04-09 DIAGNOSIS — L600 Ingrowing nail: Secondary | ICD-10-CM | POA: Diagnosis not present

## 2021-04-10 ENCOUNTER — Telehealth: Payer: Self-pay

## 2021-04-10 NOTE — Telephone Encounter (Signed)
Pt should continue to try Mucinex DM and take zyrtec or allegra daily as could be allergy related.

## 2021-04-10 NOTE — Telephone Encounter (Signed)
Angelica Romero called and said she is still not feeling good. Her ear is still bothering her. It is getting better but still bothering her. She also said she is still getting hoarseness and sore throats from the drainage. Wanted to know if she could get more antibiotics or what do you thinks she needs?

## 2021-04-10 NOTE — Telephone Encounter (Signed)
Patient advised and told her to call next week if not feeling any better to be seen in the office.

## 2021-04-17 ENCOUNTER — Other Ambulatory Visit: Payer: Self-pay | Admitting: Adult Health

## 2021-04-23 DIAGNOSIS — M5416 Radiculopathy, lumbar region: Secondary | ICD-10-CM | POA: Diagnosis not present

## 2021-04-23 DIAGNOSIS — M48061 Spinal stenosis, lumbar region without neurogenic claudication: Secondary | ICD-10-CM | POA: Diagnosis not present

## 2021-04-30 DIAGNOSIS — M5416 Radiculopathy, lumbar region: Secondary | ICD-10-CM | POA: Diagnosis not present

## 2021-05-12 DIAGNOSIS — Z961 Presence of intraocular lens: Secondary | ICD-10-CM | POA: Diagnosis not present

## 2021-05-12 DIAGNOSIS — H353131 Nonexudative age-related macular degeneration, bilateral, early dry stage: Secondary | ICD-10-CM | POA: Diagnosis not present

## 2021-05-12 DIAGNOSIS — H40013 Open angle with borderline findings, low risk, bilateral: Secondary | ICD-10-CM | POA: Diagnosis not present

## 2021-05-12 DIAGNOSIS — H04123 Dry eye syndrome of bilateral lacrimal glands: Secondary | ICD-10-CM | POA: Diagnosis not present

## 2021-05-12 DIAGNOSIS — H524 Presbyopia: Secondary | ICD-10-CM | POA: Diagnosis not present

## 2021-05-12 LAB — HM DIABETES EYE EXAM

## 2021-05-14 DIAGNOSIS — L600 Ingrowing nail: Secondary | ICD-10-CM | POA: Diagnosis not present

## 2021-05-21 DIAGNOSIS — M5416 Radiculopathy, lumbar region: Secondary | ICD-10-CM | POA: Diagnosis not present

## 2021-05-21 DIAGNOSIS — M48061 Spinal stenosis, lumbar region without neurogenic claudication: Secondary | ICD-10-CM | POA: Diagnosis not present

## 2021-05-21 DIAGNOSIS — Z9889 Other specified postprocedural states: Secondary | ICD-10-CM | POA: Diagnosis not present

## 2021-05-21 DIAGNOSIS — I10 Essential (primary) hypertension: Secondary | ICD-10-CM | POA: Diagnosis not present

## 2021-05-28 DIAGNOSIS — T8189XA Other complications of procedures, not elsewhere classified, initial encounter: Secondary | ICD-10-CM | POA: Diagnosis not present

## 2021-06-02 ENCOUNTER — Ambulatory Visit
Admission: RE | Admit: 2021-06-02 | Discharge: 2021-06-02 | Disposition: A | Discharge status: Home / Self Care | Payer: Medicare Other | Source: Ambulatory Visit | Attending: Internal Medicine | Admitting: Internal Medicine

## 2021-06-02 ENCOUNTER — Ambulatory Visit
Admission: RE | Admit: 2021-06-02 | Discharge: 2021-06-02 | Disposition: A | Discharge status: Home / Self Care | Payer: Medicare Other | Source: Ambulatory Visit | Attending: Adult Health | Admitting: Adult Health

## 2021-06-02 ENCOUNTER — Other Ambulatory Visit: Payer: Self-pay

## 2021-06-02 DIAGNOSIS — Z1231 Encounter for screening mammogram for malignant neoplasm of breast: Secondary | ICD-10-CM | POA: Diagnosis not present

## 2021-06-02 DIAGNOSIS — E2839 Other primary ovarian failure: Secondary | ICD-10-CM

## 2021-06-02 DIAGNOSIS — M85852 Other specified disorders of bone density and structure, left thigh: Secondary | ICD-10-CM | POA: Diagnosis not present

## 2021-06-02 DIAGNOSIS — Z780 Asymptomatic menopausal state: Secondary | ICD-10-CM | POA: Diagnosis not present

## 2021-06-03 ENCOUNTER — Encounter: Payer: Self-pay | Admitting: Adult Health

## 2021-06-03 DIAGNOSIS — M8580 Other specified disorders of bone density and structure, unspecified site: Secondary | ICD-10-CM | POA: Insufficient documentation

## 2021-06-04 DIAGNOSIS — M5416 Radiculopathy, lumbar region: Secondary | ICD-10-CM | POA: Diagnosis not present

## 2021-06-24 DIAGNOSIS — M5416 Radiculopathy, lumbar region: Secondary | ICD-10-CM | POA: Diagnosis not present

## 2021-06-24 DIAGNOSIS — M545 Low back pain, unspecified: Secondary | ICD-10-CM | POA: Diagnosis not present

## 2021-07-15 DIAGNOSIS — M5416 Radiculopathy, lumbar region: Secondary | ICD-10-CM | POA: Diagnosis not present

## 2021-07-15 DIAGNOSIS — I10 Essential (primary) hypertension: Secondary | ICD-10-CM | POA: Diagnosis not present

## 2021-07-15 DIAGNOSIS — M48061 Spinal stenosis, lumbar region without neurogenic claudication: Secondary | ICD-10-CM | POA: Diagnosis not present

## 2021-07-17 ENCOUNTER — Emergency Department (HOSPITAL_COMMUNITY): Payer: Medicare Other

## 2021-07-17 ENCOUNTER — Other Ambulatory Visit: Payer: Self-pay

## 2021-07-17 ENCOUNTER — Encounter (HOSPITAL_COMMUNITY): Payer: Self-pay

## 2021-07-17 ENCOUNTER — Inpatient Hospital Stay (HOSPITAL_COMMUNITY): Payer: Medicare Other

## 2021-07-17 ENCOUNTER — Inpatient Hospital Stay (HOSPITAL_COMMUNITY)
Admission: EM | Admit: 2021-07-17 | Discharge: 2021-07-23 | DRG: 177 | Disposition: A | Discharge status: Home Health | Payer: Medicare Other | Attending: Internal Medicine | Admitting: Internal Medicine

## 2021-07-17 DIAGNOSIS — Y92019 Unspecified place in single-family (private) house as the place of occurrence of the external cause: Secondary | ICD-10-CM

## 2021-07-17 DIAGNOSIS — Z743 Need for continuous supervision: Secondary | ICD-10-CM | POA: Diagnosis not present

## 2021-07-17 DIAGNOSIS — R262 Difficulty in walking, not elsewhere classified: Secondary | ICD-10-CM | POA: Diagnosis not present

## 2021-07-17 DIAGNOSIS — Z803 Family history of malignant neoplasm of breast: Secondary | ICD-10-CM | POA: Diagnosis not present

## 2021-07-17 DIAGNOSIS — Z602 Problems related to living alone: Secondary | ICD-10-CM | POA: Diagnosis not present

## 2021-07-17 DIAGNOSIS — R7303 Prediabetes: Secondary | ICD-10-CM | POA: Diagnosis not present

## 2021-07-17 DIAGNOSIS — Z888 Allergy status to other drugs, medicaments and biological substances status: Secondary | ICD-10-CM

## 2021-07-17 DIAGNOSIS — S32599A Other specified fracture of unspecified pubis, initial encounter for closed fracture: Secondary | ICD-10-CM | POA: Diagnosis present

## 2021-07-17 DIAGNOSIS — W0110XA Fall on same level from slipping, tripping and stumbling with subsequent striking against unspecified object, initial encounter: Secondary | ICD-10-CM | POA: Diagnosis present

## 2021-07-17 DIAGNOSIS — M25552 Pain in left hip: Secondary | ICD-10-CM | POA: Diagnosis present

## 2021-07-17 DIAGNOSIS — E782 Mixed hyperlipidemia: Secondary | ICD-10-CM | POA: Diagnosis not present

## 2021-07-17 DIAGNOSIS — W19XXXA Unspecified fall, initial encounter: Secondary | ICD-10-CM

## 2021-07-17 DIAGNOSIS — M8580 Other specified disorders of bone density and structure, unspecified site: Secondary | ICD-10-CM | POA: Diagnosis not present

## 2021-07-17 DIAGNOSIS — Z7982 Long term (current) use of aspirin: Secondary | ICD-10-CM | POA: Diagnosis not present

## 2021-07-17 DIAGNOSIS — S32512A Fracture of superior rim of left pubis, initial encounter for closed fracture: Secondary | ICD-10-CM | POA: Diagnosis not present

## 2021-07-17 DIAGNOSIS — E559 Vitamin D deficiency, unspecified: Secondary | ICD-10-CM | POA: Diagnosis not present

## 2021-07-17 DIAGNOSIS — M542 Cervicalgia: Secondary | ICD-10-CM | POA: Diagnosis present

## 2021-07-17 DIAGNOSIS — M109 Gout, unspecified: Secondary | ICD-10-CM | POA: Diagnosis not present

## 2021-07-17 DIAGNOSIS — S0990XA Unspecified injury of head, initial encounter: Secondary | ICD-10-CM | POA: Diagnosis not present

## 2021-07-17 DIAGNOSIS — G319 Degenerative disease of nervous system, unspecified: Secondary | ICD-10-CM | POA: Diagnosis not present

## 2021-07-17 DIAGNOSIS — J1282 Pneumonia due to coronavirus disease 2019: Secondary | ICD-10-CM | POA: Diagnosis not present

## 2021-07-17 DIAGNOSIS — M47812 Spondylosis without myelopathy or radiculopathy, cervical region: Secondary | ICD-10-CM | POA: Diagnosis not present

## 2021-07-17 DIAGNOSIS — I739 Peripheral vascular disease, unspecified: Secondary | ICD-10-CM | POA: Diagnosis not present

## 2021-07-17 DIAGNOSIS — Z6831 Body mass index (BMI) 31.0-31.9, adult: Secondary | ICD-10-CM

## 2021-07-17 DIAGNOSIS — U071 COVID-19: Secondary | ICD-10-CM | POA: Diagnosis not present

## 2021-07-17 DIAGNOSIS — R000 Tachycardia, unspecified: Secondary | ICD-10-CM | POA: Diagnosis not present

## 2021-07-17 DIAGNOSIS — M1990 Unspecified osteoarthritis, unspecified site: Secondary | ICD-10-CM | POA: Diagnosis not present

## 2021-07-17 DIAGNOSIS — Z28310 Unvaccinated for covid-19: Secondary | ICD-10-CM | POA: Diagnosis not present

## 2021-07-17 DIAGNOSIS — Z885 Allergy status to narcotic agent status: Secondary | ICD-10-CM | POA: Diagnosis not present

## 2021-07-17 DIAGNOSIS — S32592A Other specified fracture of left pubis, initial encounter for closed fracture: Secondary | ICD-10-CM

## 2021-07-17 DIAGNOSIS — R6889 Other general symptoms and signs: Secondary | ICD-10-CM | POA: Diagnosis not present

## 2021-07-17 DIAGNOSIS — S32502A Unspecified fracture of left pubis, initial encounter for closed fracture: Secondary | ICD-10-CM | POA: Diagnosis not present

## 2021-07-17 DIAGNOSIS — I10 Essential (primary) hypertension: Secondary | ICD-10-CM | POA: Diagnosis present

## 2021-07-17 DIAGNOSIS — Z79899 Other long term (current) drug therapy: Secondary | ICD-10-CM | POA: Diagnosis not present

## 2021-07-17 DIAGNOSIS — R0989 Other specified symptoms and signs involving the circulatory and respiratory systems: Secondary | ICD-10-CM | POA: Diagnosis not present

## 2021-07-17 DIAGNOSIS — E669 Obesity, unspecified: Secondary | ICD-10-CM | POA: Diagnosis present

## 2021-07-17 DIAGNOSIS — J019 Acute sinusitis, unspecified: Secondary | ICD-10-CM | POA: Diagnosis not present

## 2021-07-17 DIAGNOSIS — J984 Other disorders of lung: Secondary | ICD-10-CM | POA: Diagnosis not present

## 2021-07-17 HISTORY — DX: Other specified fracture of unspecified pubis, initial encounter for closed fracture: S32.599A

## 2021-07-17 LAB — BASIC METABOLIC PANEL
Anion gap: 12 (ref 5–15)
BUN: 23 mg/dL (ref 8–23)
CO2: 20 mmol/L — ABNORMAL LOW (ref 22–32)
Calcium: 9.5 mg/dL (ref 8.9–10.3)
Chloride: 102 mmol/L (ref 98–111)
Creatinine, Ser: 0.88 mg/dL (ref 0.44–1.00)
GFR, Estimated: >60 mL/min (ref >60)
Glucose, Bld: 88 mg/dL (ref 70–99)
Potassium: 4.6 mmol/L (ref 3.5–5.1)
Sodium: 134 mmol/L — ABNORMAL LOW (ref 135–145)

## 2021-07-17 LAB — CBC WITH DIFFERENTIAL/PLATELET
Abs Immature Granulocytes: 0.04 10*3/uL (ref 0.00–0.07)
Basophils Absolute: 0 10*3/uL (ref 0.0–0.1)
Basophils Relative: 0 %
Eosinophils Absolute: 0 10*3/uL (ref 0.0–0.5)
Eosinophils Relative: 0 %
HCT: 36.8 % (ref 36.0–46.0)
Hemoglobin: 12.3 g/dL (ref 12.0–15.0)
Immature Granulocytes: 0 %
Lymphocytes Relative: 12 %
Lymphs Abs: 1.2 10*3/uL (ref 0.7–4.0)
MCH: 31.9 pg (ref 26.0–34.0)
MCHC: 33.4 g/dL (ref 30.0–36.0)
MCV: 95.3 fL (ref 80.0–100.0)
Monocytes Absolute: 0.8 10*3/uL (ref 0.1–1.0)
Monocytes Relative: 7 %
Neutro Abs: 8.3 10*3/uL — ABNORMAL HIGH (ref 1.7–7.7)
Neutrophils Relative %: 81 %
Platelets: 321 10*3/uL (ref 150–400)
RBC: 3.86 MIL/uL — ABNORMAL LOW (ref 3.87–5.11)
RDW: 14.5 % (ref 11.5–15.5)
WBC: 10.4 10*3/uL (ref 4.0–10.5)
nRBC: 0 % (ref 0.0–0.2)

## 2021-07-17 LAB — CK: Total CK: 109 U/L (ref 38–234)

## 2021-07-17 LAB — RESP PANEL BY RT-PCR (FLU A&B, COVID) ARPGX2
Influenza A by PCR: NEGATIVE
Influenza B by PCR: NEGATIVE
SARS Coronavirus 2 by RT PCR: POSITIVE — AB

## 2021-07-17 MED ORDER — SODIUM CHLORIDE 0.9 % IV SOLN: 250.0000 mL | INTRAVENOUS | Status: DC | PRN

## 2021-07-17 MED ORDER — GUAIFENESIN-DM 100-10 MG/5ML PO SYRP
5.0000 mL | ORAL_SOLUTION | ORAL | Status: DC | PRN
  Administered 2021-07-17 – 2021-07-18 (×4): 5 mL via ORAL
  Filled 2021-07-17 (×4): qty 10

## 2021-07-17 MED ORDER — SODIUM CHLORIDE 0.9% FLUSH
3.0000 mL | Freq: Two times a day (BID) | INTRAVENOUS | Status: DC
  Administered 2021-07-17 – 2021-07-23 (×11): 3 mL via INTRAVENOUS

## 2021-07-17 MED ORDER — OXYCODONE HCL 5 MG PO TABS
5.0000 mg | ORAL_TABLET | Freq: Four times a day (QID) | ORAL | Status: DC | PRN
  Administered 2021-07-21: 5 mg via ORAL
  Filled 2021-07-17: qty 1

## 2021-07-17 MED ORDER — ACETAMINOPHEN 325 MG PO TABS
650.0000 mg | ORAL_TABLET | Freq: Four times a day (QID) | ORAL | Status: DC | PRN
  Administered 2021-07-17 – 2021-07-23 (×8): 650 mg via ORAL
  Filled 2021-07-17 (×8): qty 2

## 2021-07-17 MED ORDER — ACETAMINOPHEN 650 MG RE SUPP: 650.0000 mg | Freq: Four times a day (QID) | RECTAL | Status: DC | PRN

## 2021-07-17 MED ORDER — SODIUM CHLORIDE 0.9% FLUSH: 3.0000 mL | INTRAVENOUS | Status: DC | PRN

## 2021-07-17 MED ORDER — AMITRIPTYLINE HCL 10 MG PO TABS
10.0000 mg | ORAL_TABLET | Freq: Every evening | ORAL | Status: DC | PRN
Start: 2021-07-17 — End: 2021-07-23
  Filled 2021-07-17: qty 2

## 2021-07-17 MED ORDER — ENOXAPARIN SODIUM 40 MG/0.4ML IJ SOSY
40.0000 mg | PREFILLED_SYRINGE | Freq: Every day | INTRAMUSCULAR | Status: DC
  Administered 2021-07-17 – 2021-07-22 (×6): 40 mg via SUBCUTANEOUS
  Filled 2021-07-17 (×6): qty 0.4

## 2021-07-17 MED ORDER — ALLOPURINOL 300 MG PO TABS
150.0000 mg | ORAL_TABLET | Freq: Every day | ORAL | Status: DC
  Administered 2021-07-18 – 2021-07-23 (×6): 150 mg via ORAL
  Filled 2021-07-17 (×7): qty 1

## 2021-07-17 MED ORDER — ONDANSETRON HCL 4 MG/2ML IJ SOLN: 4.0000 mg | Freq: Four times a day (QID) | INTRAMUSCULAR | Status: DC | PRN

## 2021-07-17 MED ORDER — FENTANYL CITRATE PF 50 MCG/ML IJ SOSY
50.0000 ug | PREFILLED_SYRINGE | Freq: Once | INTRAMUSCULAR | Status: AC
  Administered 2021-07-17: 50 ug via INTRAVENOUS
  Filled 2021-07-17: qty 1

## 2021-07-17 MED ORDER — MORPHINE SULFATE (PF) 2 MG/ML IV SOLN: 1.0000 mg | INTRAVENOUS | Status: DC | PRN

## 2021-07-17 MED ORDER — IRBESARTAN 300 MG PO TABS: 300.0000 mg | ORAL_TABLET | Freq: Every day | ORAL | Status: DC

## 2021-07-17 NOTE — Plan of Care (Signed)

## 2021-07-17 NOTE — H&P (Signed)
History and Physical    Angelica Romero:[id number] DOB: [birth year] DOA: 07/17/2021  PCP: [name], MD Consultants:  GI: Dr. Benson Norway  Patient coming from:  Home - lives alone   Chief Complaint: mechanical fall, pain in pelvis and inability to bear weight.   HPI: Angelica Romero is a 80 y.o. female with medical history significant of HTN, HLD, prediabetes, vitamin D deficiency, DJD, gout who presented after mechanical fall yesterday. She was getting up to feed her animals around 8:25Am and she tripped over a rug and fell onto her left hip and hit her head. She was on the ground for 4 hours. She couldn't stand up or crawl. She was able to get her to the front door to let her daughter in.  They were able to sit her up in the chair. She peed and pooped on herself so didn't come to the hospital until today. She didn't want to come in a "filthy mess." Her daughter called EMS when she was still unable to bear weight this AM.   Pain is a 10/10 and described as terrible. Pain is located in her pelvis area.  No radiation. No loss of sensation. She denies any syncope.   No fever/chills, no dizziness/headaches, chest pain/palpitations, shortness of breath, (has a chronic cough since spring), abdominal pain, N/V, had some diarrhea yesterday, no leg swelling, dysuria.   ED Course: vitals: afebrile, bp: 118/65, HR: 91, RR: 14, oxygen: 98% room air Pertinent labs: pending Hip/pelvis xray: fracture of left inferior pubic ramus, no hip fracture. In Ed given fentanyl, ortho consulted and [facility] was asked to admit.    Review of Systems: As per HPI; otherwise review of systems reviewed and negative.   Ambulatory Status:  Ambulates without assistance   Past Medical History:  Diagnosis Date   Elevated BP    Prediabetes    Vitamin D deficiency     History reviewed. No pertinent surgical history.  Social History   Socioeconomic History   Marital status: Widowed    Spouse name: Not on file   Number of  children: Not on file   Years of education: Not on file   Highest education level: Not on file  Occupational History   Not on file  Tobacco Use   Smoking status: Never   Smokeless tobacco: Never  Substance and Sexual Activity   Alcohol use: No    Alcohol/week: 0.0 standard drinks   Drug use: Not on file   Sexual activity: Not on file  Other Topics Concern   Not on file  Social History Narrative   Not on file   Social Determinants of Health   Financial Resource Strain: Not on file  Food Insecurity: Not on file  Transportation Needs: Not on file  Physical Activity: Not on file  Stress: Not on file  Social Connections: Not on file  Intimate Partner Violence: Not on file    Allergies  Allergen Reactions   Codeine Nausea And Vomiting   Hydrocodone Nausea And Vomiting   Lisinopril Cough    cough   Omeprazole Nausea And Vomiting   Epinephrine Palpitations    Other reaction(s): Irregular heart rate    Family History  Problem Relation Age of Onset   Breast cancer Mother        27s    Prior to Admission medications   Medication Sig Start Date End Date Taking? Authorizing Provider  allopurinol (ZYLOPRIM) 300 MG tablet TAKE 1 TABLET BY MOUTH ONCE DAILY TO  PREVENT  GOUT 01/19/21   [name], NP  Ascorbic Acid (VITAMIN C GUMMIE PO) Take by mouth. Takes 1 gummy daily.    [provider]  aspirin EC 81 MG tablet Take 81 mg by mouth daily. Reported on 07/31/2015    [provider]  Cholecalciferol (VITAMIN D PO) Take 10,000 Units by mouth daily.     [provider]  colchicine 0.6 MG tablet TAKE 1 TABLET BY MOUTH ONCE DAILY AS NEEDED(STARTING WITH ONSET OF GOUT FLARE) 10/27/20   Garnet Sierras, NP  telmisartan (MICARDIS) 80 MG tablet Take  1 tablet  Daily  for BP / Patient knows to take by mouth  !   - Thanks 04/17/21   [name], MD  topiramate (TOPAMAX) 50 MG tablet Take 1/2 to 1 tablet 2 x /day at Suppertime & Bedtime for Dieting & Weight  Loss 06/10/20   [name], MD    Physical Exam: Vitals:   07/17/21 1541 07/17/21 1545 07/17/21 1600 07/17/21 1700  BP: 118/65  (!) 118/59 (!) 147/128  Pulse: 91  98 82  Resp: 14  19 (!) 25  Temp: 98.2 F (36.8 C)     TempSrc: Oral     SpO2: 98%  98% 97%  Weight:  72.6 kg    Height:  5' (1.524 m)       General:  Appears calm and comfortable and is in NAD Eyes:  PERRL, EOMI, normal lids, iris ENT:  grossly normal hearing, lips & tongue, mmm; appropriate dentition-dentures  Neck:  no LAD, masses or thyromegaly; no carotid bruits Cardiovascular:  RRR, harsh systolic murmur.  No LE edema.  Respiratory:   CTA bilaterally with no wheezes/rales/rhonchi.  Normal respiratory effort. Abdomen:  soft, NT, ND, NABS Back:   normal alignment, no CVAT Skin:  no rash or induration seen on limited exam. Erythema over left cheek.  Musculoskeletal:  she has decreased strength in bilateral LE due to pain in pelvis. no bony abnormality. She h as TTP over the left side of her neck, but ROM intact.  Lower extremity:  No LE edema.  Limited foot exam with no ulcerations.  2+ distal pulses. Psychiatric:  grossly normal mood and affect, speech fluent and appropriate, AOx3 Neurologic:  CN 2-12 grossly intact, moves all extremities in coordinated fashion, sensation intact    Radiological Exams on Admission: Independently reviewed - see discussion in A/P where applicable  DG HIP UNILAT WITH PELVIS 2-3 VIEWS LEFT  Result Date: 07/17/2021 CLINICAL DATA:  Fall.  Left hip pain EXAM: DG HIP (WITH OR WITHOUT PELVIS) 2-3V LEFT COMPARISON:  07/21/2006 FINDINGS: Negative for fracture of the left hip.  Normal hip joint space. There is a fracture of the left inferior pubic ramus which could be acute. This was not seen previously. No other pelvic fracture. IMPRESSION: Fracture left inferior pubic ramus which may be acute. No hip fracture. Electronically Signed   By: Franchot Gallo M.D.   On: 07/17/2021 16:46     EKG: Independently reviewed.  NSR with rate 87; nonspecific ST changes with no evidence of acute ischemia   Labs on Admission: I have personally reviewed the available labs and imaging studies at the time of the admission.  Pertinent labs:  Pending    Assessment/Plan Principal Problem:   Pubic ramus fracture [facility]) -80 year old presenting with mechanical fall over a rug with inability to bear weight found to have a pubic ramus fracture.  -admit to med surg -ortho has been  consulted -PT eval/tx as she can not bear weight and lives alone -CT head/neck see below -has nausea and vomiting with codeine/norco. Will try oxycodone and morphine with zofran and see how she does as hopefully we can transition to oral pain meds as needed. Discussed with daughter and okay with plan.  -labs pending/CK  -SW consult for possible rehab if needed   Active Problems:   Neck pain -pain with palpation over left cervical area, but has full ROM. Will fall onto head will check head and neck CT.     Labile hypertension Well controlled/soft. Start back home meds tomorrow if indicated     Prediabetes A1c of 5.6 in 01/2021 Continue diet and lifestyle modifications    Hyperlipidemia, mixed  Lipid panel in 6/22, well controlled. No medication.   Gout Continue allopurinol    Body mass index is 31.25 kg/m.    Level of care: Med-Surg DVT prophylaxis:  lovenox  Code Status:   Full - confirmed with patient Family Communication: None present; I spoke with the patient's daughter by telephone at the time of admission. Earlie Counts: [phone] Disposition Plan:  The patient is from: home  Anticipated d/c is to: per day team/rehab   Requires inpatient hospitalization due to pubic ramus fracture and inability to bear weight. She is not safe to go home at this time.  requires constant monitoring, assessment and MDM with specialists.    Patient is currently: stable  Consults called: ortho/SW   Admission status:  inpatient    Orma Flaming MD [facility]   How to contact the [facility] Attending or Consulting provider [place] or covering provider during after hours [place], for this patient?  Check the care team in [facility] and look for a) attending/consulting [facility] provider listed and b) the [facility] team listed Log into [email or web address] and use [place]'s universal password to access. If you do not have the password, please contact the hospital operator. Locate the [facility] provider you are looking for under [facility] and page to a number that you can be directly reached. If you still have difficulty reaching the provider, please page the [facility] (Director on Call) for the Hospitalists listed on amion for assistance.   07/17/2021, 6:12 PM

## 2021-07-17 NOTE — ED Triage Notes (Signed)
Pt BIB EMS after sustaining a fall at home. Pt did hit her head, but denies LOC, she is not on a blood thinner. Pt c/o not being able to get up and not bear weight on her left side.   EMS gave 148mcg of Fentanyl on route.   VSS w/ EMS.

## 2021-07-17 NOTE — ED Provider Notes (Addendum)
[facility] EMERGENCY DEPARTMENT Provider Note   CSN: [id number] Arrival date & time: 07/17/21  1528     History Chief Complaint  Patient presents with   Angelica Romero is a 80 y.o. female with no significant medical history presents after suffering fall.  Patient states yesterday around 8 AM she was fixing her kittens breakfast when they caused her to trip and fall.  Patient states that she did strike her head but is not on blood thinners.  Patient states that it took her 4 hours to get to her front door to let her daughter in and she was on the ground entire time causing her to soil herself.  Patient now complaining of left groin and hip pain and stating she is unable to walk.   Fall Pertinent negatives include no chest pain, no headaches and no shortness of breath.      Past Medical History:  Diagnosis Date   Elevated BP    Prediabetes    Vitamin D deficiency     Patient Active Problem List   Diagnosis Date Noted   Pubic ramus fracture ([place]) 07/17/2021   Osteopenia 06/03/2021   Abnormal glucose 06/09/2020   Prediabetes 06/09/2020   Obesity (BMI 30.0-34.9) 06/09/2020   Hyperuricemia 03/05/2020   DJD (degenerative joint disease), multiple sites 07/14/2017   Hyperlipidemia, mixed 09/08/2016   Vitamin D deficiency 07/31/2015   Labile hypertension 01/02/2015    History reviewed. No pertinent surgical history.   OB History   No obstetric history on file.     Family History  Problem Relation Age of Onset   Breast cancer Mother        34s    Social History   Tobacco Use   Smoking status: Never   Smokeless tobacco: Never  Substance Use Topics   Alcohol use: No    Alcohol/week: 0.0 standard drinks    Home Medications Prior to Admission medications   Medication Sig Start Date End Date Taking? Authorizing Provider  allopurinol (ZYLOPRIM) 300 MG tablet TAKE 1 TABLET BY MOUTH ONCE DAILY TO  PREVENT  GOUT 01/19/21   [name], NP   Ascorbic Acid (VITAMIN C GUMMIE PO) Take by mouth. Takes 1 gummy daily.    [provider]  aspirin EC 81 MG tablet Take 81 mg by mouth daily. Reported on 07/31/2015    [provider]  Cholecalciferol (VITAMIN D PO) Take 10,000 Units by mouth daily.     [provider]  colchicine 0.6 MG tablet TAKE 1 TABLET BY MOUTH ONCE DAILY AS NEEDED(STARTING WITH ONSET OF GOUT FLARE) 10/27/20   Garnet Sierras, NP  telmisartan (MICARDIS) 80 MG tablet Take  1 tablet  Daily  for BP / Patient knows to take by mouth  !   - Thanks 04/17/21   [name], MD  topiramate (TOPAMAX) 50 MG tablet Take 1/2 to 1 tablet 2 x /day at Suppertime & Bedtime for Dieting & Weight Loss 06/10/20   [name], MD    Allergies    Codeine, Hydrocodone, Lisinopril, and Omeprazole  Review of Systems   Review of Systems  Respiratory:  Negative for shortness of breath.   Cardiovascular:  Negative for chest pain and leg swelling.  Gastrointestinal:  Negative for nausea and vomiting.  Genitourinary:  Negative for difficulty urinating.  Musculoskeletal:  Positive for gait problem.  Neurological:  Negative for dizziness, syncope, weakness, light-headedness and headaches.  All other systems reviewed and are  negative.  Physical Exam Updated Vital Signs BP (!) 147/128   Pulse 82   Temp 98.2 F (36.8 C) (Oral)   Resp (!) 25   Ht 5' (1.524 m)   Wt 72.6 kg   SpO2 97%   BMI 31.25 kg/m   Physical Exam Vitals and nursing note reviewed.  Constitutional:      General: She is not in acute distress.    Appearance: She is not toxic-appearing.  HENT:     Head: Normocephalic and atraumatic.     Mouth/Throat:     Mouth: Mucous membranes are dry.  Eyes:     Extraocular Movements: Extraocular movements intact.     Pupils: Pupils are equal, round, and reactive to light.  Cardiovascular:     Rate and Rhythm: Normal rate and regular rhythm.  Pulmonary:     Effort: Pulmonary effort is normal.      Breath sounds: Normal breath sounds. No wheezing.  Abdominal:     General: Abdomen is flat.     Palpations: Abdomen is soft.     Tenderness: There is no abdominal tenderness.  Musculoskeletal:     Right hip: Normal.     Left hip: Tenderness present. No deformity. Decreased range of motion. Decreased strength.     Comments: Left hip tenderness  Skin:    General: Skin is warm and dry.  Neurological:     General: No focal deficit present.     Mental Status: She is alert and oriented to person, place, and time.     GCS: GCS eye subscore is 4. GCS verbal subscore is 5. GCS motor subscore is 6.     Cranial Nerves: No cranial nerve deficit.     Sensory: No sensory deficit.     Motor: No weakness.     Coordination: Coordination normal.    ED Results / Procedures / Treatments   Labs (all labs ordered are listed, but only abnormal results are displayed) Labs Reviewed  RESP PANEL BY RT-PCR (FLU A&B, COVID) ARPGX2  CK  CBC WITH DIFFERENTIAL/PLATELET  BASIC METABOLIC PANEL    EKG EKG Interpretation  Date/Time:  Friday July 17 2021 15:43:35 EST Ventricular Rate:  87 PR Interval:  187 QRS Duration: 97 QT Interval:  357 QTC Calculation: 430 R Axis:   43 Text Interpretation: Sinus rhythm Low voltage, precordial leads No prior ECG for comparison. No STEMI Confirmed by Antony Blackbird [phone]) on 07/17/2021 4:20:36 PM  Radiology DG HIP UNILAT WITH PELVIS 2-3 VIEWS LEFT  Result Date: 07/17/2021 CLINICAL DATA:  Fall.  Left hip pain EXAM: DG HIP (WITH OR WITHOUT PELVIS) 2-3V LEFT COMPARISON:  07/21/2006 FINDINGS: Negative for fracture of the left hip.  Normal hip joint space. There is a fracture of the left inferior pubic ramus which could be acute. This was not seen previously. No other pelvic fracture. IMPRESSION: Fracture left inferior pubic ramus which may be acute. No hip fracture. Electronically Signed   By: Franchot Gallo M.D.   On: 07/17/2021 16:46    Procedures Procedures    Medications Ordered in ED Medications  fentaNYL (SUBLIMAZE) injection 50 mcg (has no administration in time range)    ED Course  I have reviewed the triage vital signs and the nursing notes.  Pertinent labs & imaging results that were available during my care of the patient were reviewed by me and considered in my medical decision making (see chart for details).    MDM Rules/Calculators/A&P  80 year old female presents after falling at home yesterday.  Patient states she was on the ground for 4 hours while she had crawled up the door to let her daughter in.  Patient presents today due to continued pain and inability to walk.  On exam, patient left leg not showing any external signs of trauma.  Leg not shortened, not rotated.  We will work this patient up with a left hip and pelvis x-ray, CBC, BMP, creatinine kinase.  Patient pelvis x-ray shows pubic rami fracture.  Will consult Ortho.  Ortho was consulted who directed me to admit to hospitalist and ortho will consult.  Spoke with Dr. Eliberto Ivory Hospitalist who agreed to see and admit patient.   Final Clinical Impression(s) / ED Diagnoses Final diagnoses:  Fall  Pubic ramus fracture, left, closed, initial encounter [facility])    Rx / DC Orders ED Discharge Orders     None        Azucena Cecil, PA-C 07/17/21 1737    Azucena Cecil, PA-C 07/17/21 1808    Wyvonnia Dusky, MD 07/18/21 [phone]

## 2021-07-18 ENCOUNTER — Inpatient Hospital Stay (HOSPITAL_COMMUNITY): Payer: Medicare Other

## 2021-07-18 DIAGNOSIS — U071 COVID-19: Principal | ICD-10-CM

## 2021-07-18 LAB — BASIC METABOLIC PANEL
Anion gap: 12 (ref 5–15)
BUN: 21 mg/dL (ref 8–23)
CO2: 19 mmol/L — ABNORMAL LOW (ref 22–32)
Calcium: 9 mg/dL (ref 8.9–10.3)
Chloride: 102 mmol/L (ref 98–111)
Creatinine, Ser: 0.78 mg/dL (ref 0.44–1.00)
GFR, Estimated: >60 mL/min (ref >60)
Glucose, Bld: 99 mg/dL (ref 70–99)
Potassium: 3.8 mmol/L (ref 3.5–5.1)
Sodium: 133 mmol/L — ABNORMAL LOW (ref 135–145)

## 2021-07-18 LAB — CBC
HCT: 34.4 % — ABNORMAL LOW (ref 36.0–46.0)
Hemoglobin: 11.7 g/dL — ABNORMAL LOW (ref 12.0–15.0)
MCH: 31.9 pg (ref 26.0–34.0)
MCHC: 34 g/dL (ref 30.0–36.0)
MCV: 93.7 fL (ref 80.0–100.0)
Platelets: 346 10*3/uL (ref 150–400)
RBC: 3.67 MIL/uL — ABNORMAL LOW (ref 3.87–5.11)
RDW: 14.2 % (ref 11.5–15.5)
WBC: 8.6 10*3/uL (ref 4.0–10.5)
nRBC: 0 % (ref 0.0–0.2)

## 2021-07-18 LAB — D-DIMER, QUANTITATIVE: D-Dimer, Quant: 3.95 ug/mL-FEU — ABNORMAL HIGH (ref 0.00–0.50)

## 2021-07-18 LAB — C-REACTIVE PROTEIN: CRP: 19.6 mg/dL — ABNORMAL HIGH (ref <1.0)

## 2021-07-18 LAB — VITAMIN D 25 HYDROXY (VIT D DEFICIENCY, FRACTURES): Vit D, 25-Hydroxy: 105.61 ng/mL — ABNORMAL HIGH (ref 30–100)

## 2021-07-18 MED ORDER — ADULT MULTIVITAMIN W/MINERALS CH
1.0000 | ORAL_TABLET | Freq: Every day | ORAL | Status: DC
Start: 2021-07-18 — End: 2021-07-23
  Administered 2021-07-18 – 2021-07-23 (×6): 1 via ORAL
  Filled 2021-07-18 (×7): qty 1

## 2021-07-18 MED ORDER — SODIUM CHLORIDE 0.9 % IV SOLN
100.0000 mg | Freq: Every day | INTRAVENOUS | Status: AC
  Administered 2021-07-19 – 2021-07-22 (×4): 100 mg via INTRAVENOUS
  Filled 2021-07-18 (×4): qty 20

## 2021-07-18 MED ORDER — SODIUM CHLORIDE 0.9 % IV SOLN
200.0000 mg | Freq: Once | INTRAVENOUS | Status: DC
  Filled 2021-07-18: qty 40

## 2021-07-18 MED ORDER — GUAIFENESIN-DM 100-10 MG/5ML PO SYRP
10.0000 mL | ORAL_SOLUTION | ORAL | Status: DC | PRN
  Administered 2021-07-19: 10 mL via ORAL
  Filled 2021-07-18: qty 10

## 2021-07-18 MED ORDER — ENSURE ENLIVE PO LIQD
237.0000 mL | Freq: Two times a day (BID) | ORAL | Status: DC
  Administered 2021-07-18 – 2021-07-23 (×7): 237 mL via ORAL
  Filled 2021-07-18 (×2): qty 237

## 2021-07-18 MED ORDER — PREDNISONE 20 MG PO TABS
50.0000 mg | ORAL_TABLET | Freq: Every day | ORAL | Status: DC
  Administered 2021-07-21 – 2021-07-23 (×3): 50 mg via ORAL
  Filled 2021-07-18 (×3): qty 1

## 2021-07-18 MED ORDER — SODIUM CHLORIDE 0.9 % IV SOLN: 100.0000 mg | Freq: Every day | INTRAVENOUS | Status: DC

## 2021-07-18 MED ORDER — METHYLPREDNISOLONE SODIUM SUCC 40 MG IJ SOLR
0.5000 mg/kg | Freq: Two times a day (BID) | INTRAMUSCULAR | Status: AC
  Administered 2021-07-18 – 2021-07-21 (×6): 36.4 mg via INTRAVENOUS
  Filled 2021-07-18 (×6): qty 1

## 2021-07-18 MED ORDER — SODIUM CHLORIDE 0.9 % IV SOLN
200.0000 mg | Freq: Once | INTRAVENOUS | Status: AC
  Administered 2021-07-18: 200 mg via INTRAVENOUS
  Filled 2021-07-18 (×2): qty 40

## 2021-07-18 NOTE — Evaluation (Signed)
Physical Therapy Evaluation Patient Details Name: Angelica Romero MRN: [id number] DOB: 27-Jan-1941 Today's Date: 07/18/2021  History of Present Illness  Pt. is 80 yr old F admitted on 07/17/21 after fall resulting in inability to bear weight on L side. Imaging (+) for L inferior pubic ramus fx. Found to be COVID (+). PMH: HTN  Clinical Impression  Pt was previously independent with all functional mobility prior to fall resulting in L pubic ramus fx.  Now pt is requiring min A for bed mobility and transfers with use of RW.  Unable to ambulate due to increased pain through L LE with activity.  Pt demos dec LE strength, dec balance, transfer difficulty, and dec activity tolerance and would benefit from skilled PT to address deficits indicated in initial eval.  AMPAC score is indicating home with [facility] PT, which is pt's preference, however, pt is not yet able to amb household distances, has steps to enter home and lives alone without support.  Pt does have neighbors and a daughter who may be able to help intermittently, but pt will be on COVID precautions which will further limit available support.  For safety with transition, SNF is recommended prior to return home.       Recommendations for follow up therapy are one component of a multi-disciplinary discharge planning process, led by the attending physician.  Recommendations may be updated based on patient status, additional functional criteria and insurance authorization.  Follow Up Recommendations Skilled nursing-short term rehab (<3 hours/day)    Assistance Recommended at Discharge Frequent or constant Supervision/Assistance  Functional Status Assessment Patient has had a recent decline in their functional status and demonstrates the ability to make significant improvements in function in a reasonable and predictable amount of time.  Equipment Recommendations  Rolling walker (2 wheels);BSC/3in1    Recommendations for Other Services        Precautions / Restrictions Precautions Precautions: Fall Restrictions Weight Bearing Restrictions: Yes LLE Weight Bearing: Weight bearing as tolerated Other Position/Activity Restrictions: Verbally verified with ortho as note reads "PWB as tolerated"      Mobility  Bed Mobility Overal bed mobility: Needs Assistance Bed Mobility: Supine to Sit     Supine to sit: Min assist     General bed mobility comments: Min A for L LE negotiation and VCs to utilize hands to scoot pelvis to EOB.  Takes inc time to complete activity. Patient Response: Cooperative  Transfers Overall transfer level: Needs assistance Equipment used: Rolling walker (2 wheels) Transfers: Sit to/from Stand;Bed to chair/wheelchair/BSC Sit to Stand: Min assist Stand pivot transfers: Min assist         General transfer comment: Performs sit > stand x 2, once from EOB and once from [facility], with min A.  VCs to push up from bed/BSC and reach back for handles for safety with sit > stand.  Demos fair balance.  Inc difficulty noted with attempt to take steps so instead pt. takes a step with L foot and then shimmies R foot over.  Fair balance overall.    Ambulation/Gait                  Stairs            Wheelchair Mobility    Modified Rankin (Stroke Patients Only)       Balance Overall balance assessment: Needs assistance Sitting-balance support: Bilateral upper extremity supported Sitting balance-Leahy Scale: Fair     Standing balance support: Bilateral upper extremity supported;During functional activity Standing  balance-Leahy Scale: Poor                               Pertinent Vitals/Pain Pain Assessment: 0-10 Pain Score: 7  Pain Location: L hip Pain Descriptors / Indicators: Aching;Discomfort Pain Intervention(s): Limited activity within patient's tolerance;Monitored during session;Repositioned    Home Living Family/patient expects to be discharged to:: Private  residence Living Arrangements: Alone Available Help at Discharge: Neighbor;Family;Available PRN/intermittently Type of Home: House Home Access: Stairs to enter Entrance Stairs-Rails: Can reach both Entrance Stairs-Number of Steps: 2 in front, 5 in back   Home Layout: One level Home Equipment: None      Prior Function Prior Level of Function : Independent/Modified Independent                     Hand Dominance        Extremity/Trunk Assessment   Upper Extremity Assessment Upper Extremity Assessment: Overall WFL for tasks assessed    Lower Extremity Assessment Lower Extremity Assessment: LLE deficits/detail LLE Deficits / Details: Grossly 3-/5       Communication   Communication: No difficulties  Cognition Arousal/Alertness: Awake/alert Behavior During Therapy: WFL for tasks assessed/performed Overall Cognitive Status: Within Functional Limits for tasks assessed                                 General Comments: Pt. is supine in bed when PT arrives.  Anxious to get up to use bathroom.  Wants to walk to bathroom but PT recommends trial to [facility] first.  Pt. is agreeable.  Alert and oriented x 3        General Comments General comments (skin integrity, edema, etc.): Pt. really wants to return home but has limited support available.  Strongly encouraged to consider rehab for safety.    Exercises     Assessment/Plan    PT Assessment Patient needs continued PT services  PT Problem List Decreased strength;Decreased mobility;Decreased range of motion;Decreased activity tolerance;Decreased balance;Decreased knowledge of use of DME;Pain       PT Treatment Interventions DME instruction;Therapeutic exercise;Gait training;Balance training;Stair training;Functional mobility training;Therapeutic activities;Patient/family education    PT Goals (Current goals can be found in the Care Plan section)  Acute Rehab PT Goals Patient Stated Goal: Pt's goal is to  return home PT Goal Formulation: With patient Time For Goal Achievement: 08/01/21 Potential to Achieve Goals: Good    Frequency Min 3X/week   Barriers to discharge Decreased caregiver support;Inaccessible home environment Has steps to enter, lives alone, will be on COVID precautions    Co-evaluation               AM-PAC PT "6 Clicks" Mobility  Outcome Measure Help needed turning from your back to your side while in a flat bed without using bedrails?: A Little Help needed moving from lying on your back to sitting on the side of a flat bed without using bedrails?: A Little Help needed moving to and from a bed to a chair (including a wheelchair)?: A Little Help needed standing up from a chair using your arms (e.g., wheelchair or bedside chair)?: A Little Help needed to walk in hospital room?: A Lot Help needed climbing 3-5 steps with a railing? : A Lot 6 Click Score: 16    End of Session Equipment Utilized During Treatment: Gait belt Activity Tolerance: Patient tolerated treatment well;Patient  limited by pain Patient left: in chair;with call bell/phone within reach;with chair alarm set   PT Visit Diagnosis: Unsteadiness on feet (R26.81);History of falling (Z91.81);Other abnormalities of gait and mobility (R26.89);Pain Pain - Right/Left: Left Pain - part of body: Hip    Time: 1101-1140 PT Time Calculation (min) (ACUTE ONLY): 39 min   Charges:   PT Evaluation $PT Eval Low Complexity: 1 Low PT Treatments $Therapeutic Activity: 23-37 mins       Deiona Hooper A. Martel Galvan, PT, DPT Acute Rehabilitation Services Office: [place] 07/18/2021, 11:58 AM

## 2021-07-18 NOTE — Progress Notes (Signed)
Initial Nutrition Assessment  DOCUMENTATION CODES:   Not applicable  INTERVENTION:   - Recommend liberalizing diet to Regular, verbal with readback order placed per MD  - Ensure Enlive po BID, each supplement provides 350 kcal and 20 grams of protein  - MVI with minerals daily  - Encourage PO intake  NUTRITION DIAGNOSIS:   Increased nutrient needs related to acute illness (COVID-19, fx of L inferior pubic ramus) as evidenced by estimated needs.  GOAL:   Patient will meet greater than or equal to 90% of their needs  MONITOR:   PO intake, Supplement acceptance, Labs, Weight trends  REASON FOR ASSESSMENT:   Malnutrition Screening Tool    ASSESSMENT:   80 year old female who presented to the ED after a mechanical fall. PMH of HTN, HLD, vitamin D deficiency, DJD, gout. Pt admitted with fracture of L inferior pubic ramus. Pt also tested positive for COVID-19.  Pt currently on a Heart Healthy diet with no meal completions charted this admission. Discussed liberalizing diet to Regular with MD who agreed. Orders placed.  Unable to reach pt via phone call to room. Reviewed available weight history in chart. Noted pt with a total weight loss of 7.8 kg since 09/16/20. This is a 9.7% weight loss in 10 months which is not clinically significant for timeframe. However, wt loss is concerning given it is progressive in nature. Pt at risk for malnutrition.  RD will order oral nutrition supplements to aid pt in meeting kcal and protein needs. Will also order daily MVI with minerals.  Medications reviewed.  Labs reviewed: sodium 133  NUTRITION - FOCUSED PHYSICAL EXAM:  Unable to complete at this time. RD working remotely.  Diet Order:   Diet Order             Diet regular Room service appropriate? Yes; Fluid consistency: Thin  Diet effective now                   EDUCATION NEEDS:   No education needs have been identified at this time  Skin:  Skin Assessment: Reviewed RN  Assessment  Last BM:  07/16/21  Height:   Ht Readings from Last 1 Encounters:  07/17/21 5' (1.524 m)    Weight:   Wt Readings from Last 1 Encounters:  07/17/21 72.6 kg    BMI:  Body mass index is 31.25 kg/m.  Estimated Nutritional Needs:   Kcal:  1500-1700  Protein:  80-90 grams  Fluid:  1.5-1.7 L    Gustavus Bryant, MS, RD, LDN Inpatient Clinical Dietitian Please see AMiON for contact information.

## 2021-07-18 NOTE — Consult Note (Signed)
Reason for Consult: Left hip pain Referring Physician: [name] is an 80 y.o. female.  HPI: Angelica Romero is an 80 year old female with left hip pain following a fall at home.  Denies any other orthopedic complaints.  Imaging studies revealed left pubic rami fracture.  Patient lives alone at home but has daughter nearby.  Past Medical History:  Diagnosis Date   Elevated BP    Prediabetes    Vitamin D deficiency     History reviewed. No pertinent surgical history.  Family History  Problem Relation Age of Onset   Breast cancer Mother        53s    Social History:  reports that she has never smoked. She has never used smokeless tobacco. She reports that she does not drink alcohol. No history on file for drug use.  Allergies:  Allergies  Allergen Reactions   Codeine Nausea And Vomiting   Hydrocodone Nausea And Vomiting   Lisinopril Cough    cough   Omeprazole Nausea And Vomiting   Epinephrine Palpitations    Other reaction(s): Irregular heart rate    Medications: I have reviewed the patient's current medications.  Results for orders placed or performed during the hospital encounter of 07/17/21 (from the past 48 hour(s))  Resp Panel by RT-PCR (Flu A&B, Covid) Nasopharyngeal Swab     Status: Abnormal   Collection Time: 07/17/21  4:51 PM   Specimen: Nasopharyngeal Swab; Nasopharyngeal(NP) swabs in vial transport medium  Result Value Ref Range   SARS Coronavirus 2 by RT PCR POSITIVE (A) NEGATIVE    Comment: RESULT CALLED TO, READ BACK BY AND VERIFIED WITH: RN Jennefer Bravo [id number] MLM (NOTE) SARS-CoV-2 target nucleic acids are DETECTED.  The SARS-CoV-2 RNA is generally detectable in upper respiratory specimens during the acute phase of infection. Positive results are indicative of the presence of the identified virus, but do not rule out bacterial infection or co-infection with other pathogens not detected by the test. Clinical correlation with patient history  and other diagnostic information is necessary to determine patient infection status. The expected result is Negative.  Fact Sheet for Patients: EntrepreneurPulse.com.au  Fact Sheet for Healthcare Providers: IncredibleEmployment.be  This test is not yet approved or cleared by the [place] FDA and  has been authorized for detection and/or diagnosis of SARS-CoV-2 by FDA under an Emergency Use Authorization (EUA).  This EUA will remain in effect (meaning this test can be used)  for the duration of  the COVID-19 declaration under Section 564(b)(1) of the Act, 21 U.S.C. section 360bbb-3(b)(1), unless the authorization is terminated or revoked sooner.     Influenza A by PCR NEGATIVE NEGATIVE   Influenza B by PCR NEGATIVE NEGATIVE    Comment: (NOTE) The Xpert Xpress SARS-CoV-2/FLU/RSV plus assay is intended as an aid in the diagnosis of influenza from Nasopharyngeal swab specimens and should not be used as a sole basis for treatment. Nasal washings and aspirates are unacceptable for Xpert Xpress SARS-CoV-2/FLU/RSV testing.  Fact Sheet for Patients: EntrepreneurPulse.com.au  Fact Sheet for Healthcare Providers: IncredibleEmployment.be  This test is not yet approved or cleared by the [place] FDA and has been authorized for detection and/or diagnosis of SARS-CoV-2 by FDA under an Emergency Use Authorization (EUA). This EUA will remain in effect (meaning this test can be used) for the duration of the COVID-19 declaration under Section 564(b)(1) of the Act, 21 U.S.C. section 360bbb-3(b)(1), unless the authorization is terminated or revoked.  Performed at [facility], [place] [address] [place], [place] [address]   CK     Status: None   Collection Time: 07/17/21  5:32 PM  Result Value Ref Range   Total CK 109 38 - 234 U/L    Comment: Performed at [facility], [place] [address],  [place], [place] [address]  CBC with Differential     Status: Abnormal   Collection Time: 07/17/21  5:32 PM  Result Value Ref Range   WBC 10.4 4.0 - 10.5 K/uL   RBC 3.86 (L) 3.87 - 5.11 MIL/uL   Hemoglobin 12.3 12.0 - 15.0 g/dL   HCT 36.8 36.0 - 46.0 %   MCV 95.3 80.0 - 100.0 fL   MCH 31.9 26.0 - 34.0 pg   MCHC 33.4 30.0 - 36.0 g/dL   RDW 14.5 11.5 - 15.5 %   Platelets 321 150 - 400 K/uL   nRBC 0.0 0.0 - 0.2 %   Neutrophils Relative % 81 %   Neutro Abs 8.3 (H) 1.7 - 7.7 K/uL   Lymphocytes Relative 12 %   Lymphs Abs 1.2 0.7 - 4.0 K/uL   Monocytes Relative 7 %   Monocytes Absolute 0.8 0.1 - 1.0 K/uL   Eosinophils Relative 0 %   Eosinophils Absolute 0.0 0.0 - 0.5 K/uL   Basophils Relative 0 %   Basophils Absolute 0.0 0.0 - 0.1 K/uL   Immature Granulocytes 0 %   Abs Immature Granulocytes 0.04 0.00 - 0.07 K/uL    Comment: Performed at [facility] [address] [place], [place] [address]  Basic metabolic panel     Status: Abnormal   Collection Time: 07/17/21  5:32 PM  Result Value Ref Range   Sodium 134 (L) 135 - 145 mmol/L   Potassium 4.6 3.5 - 5.1 mmol/L    Comment: SLIGHT HEMOLYSIS   Chloride 102 98 - 111 mmol/L   CO2 20 (L) 22 - 32 mmol/L   Glucose, Bld 88 70 - 99 mg/dL    Comment: Glucose reference range applies only to samples taken after fasting for at least 8 hours.   BUN 23 8 - 23 mg/dL   Creatinine, Ser 0.88 0.44 - 1.00 mg/dL   Calcium 9.5 8.9 - 10.3 mg/dL   GFR, Estimated >60 >60 mL/min    Comment: (NOTE) Calculated using the CKD-EPI Creatinine Equation (2021)    Anion gap 12 5 - 15    Comment: Performed at [facility] [address], [place], [place] [address]  CBC     Status: Abnormal   Collection Time: 07/18/21  2:34 AM  Result Value Ref Range   WBC 8.6 4.0 - 10.5 K/uL   RBC 3.67 (L) 3.87 - 5.11 MIL/uL   Hemoglobin 11.7 (L) 12.0 - 15.0 g/dL   HCT 34.4 (L) 36.0 - 46.0 %   MCV 93.7 80.0 - 100.0 fL   MCH 31.9 26.0 - 34.0 pg   MCHC 34.0 30.0 -  36.0 g/dL   RDW 14.2 11.5 - 15.5 %   Platelets 346 150 - 400 K/uL   nRBC 0.0 0.0 - 0.2 %    Comment: Performed at [facility], [place] [address], [place], [place] [address]  Basic metabolic panel     Status: Abnormal   Collection Time: 07/18/21  2:34 AM  Result Value Ref Range   Sodium 133 (L) 135 - 145 mmol/L   Potassium 3.8 3.5 - 5.1 mmol/L    Comment: DELTA  CHECK NOTED   Chloride 102 98 - 111 mmol/L   CO2 19 (L) 22 - 32 mmol/L   Glucose, Bld 99 70 - 99 mg/dL    Comment: Glucose reference range applies only to samples taken after fasting for at least 8 hours.   BUN 21 8 - 23 mg/dL   Creatinine, Ser 0.78 0.44 - 1.00 mg/dL   Calcium 9.0 8.9 - 10.3 mg/dL   GFR, Estimated >60 >60 mL/min    Comment: (NOTE) Calculated using the CKD-EPI Creatinine Equation (2021)    Anion gap 12 5 - 15    Comment: Performed at [facility] [address], [place], [place] [address]    CT HEAD WO CONTRAST (5MM)  Result Date: 07/17/2021 CLINICAL DATA:  Head trauma, moderate-severe.  Fall. EXAM: CT HEAD WITHOUT CONTRAST TECHNIQUE: Contiguous axial images were obtained from the base of the skull through the vertex without intravenous contrast. COMPARISON:  None. FINDINGS: Brain: There is atrophy and chronic small vessel disease changes. No acute intracranial abnormality. Specifically, no hemorrhage, hydrocephalus, mass lesion, acute infarction, or significant intracranial injury. Vascular: No hyperdense vessel or unexpected calcification. Skull: No acute calvarial abnormality. Sinuses/Orbits: Complete opacification of the left sphenoid sinus. Mucosal thickening in the posterior ethmoid air cells. Mucosal thickening and air-fluid level in the left maxillary sinus. Other: None IMPRESSION: Atrophy, chronic microvascular disease. No acute intracranial abnormality. Acute on chronic left sinusitis. Electronically Signed   By: Rolm Baptise M.D.   On: 07/17/2021 18:40   CT CERVICAL SPINE WO CONTRAST  Result  Date: 07/17/2021 CLINICAL DATA:  Fall.  Neck trauma (Age >= 65y) EXAM: CT CERVICAL SPINE WITHOUT CONTRAST TECHNIQUE: Multidetector CT imaging of the cervical spine was performed without intravenous contrast. Multiplanar CT image reconstructions were also generated. COMPARISON:  None. FINDINGS: Alignment: Normal Skull base and vertebrae: No acute fracture. No primary bone lesion or focal pathologic process. Soft tissues and spinal canal: No prevertebral fluid or swelling. No visible canal hematoma. Disc levels: Moderate diffuse degenerative disc disease and facet disease, left greater than right. Upper chest: No acute findings Other: None IMPRESSION: Cervical spondylosis.  No acute bony abnormality. Electronically Signed   By: Rolm Baptise M.D.   On: 07/17/2021 18:41   DG HIP UNILAT WITH PELVIS 2-3 VIEWS LEFT  Result Date: 07/17/2021 CLINICAL DATA:  Fall.  Left hip pain EXAM: DG HIP (WITH OR WITHOUT PELVIS) 2-3V LEFT COMPARISON:  07/21/2006 FINDINGS: Negative for fracture of the left hip.  Normal hip joint space. There is a fracture of the left inferior pubic ramus which could be acute. This was not seen previously. No other pelvic fracture. IMPRESSION: Fracture left inferior pubic ramus which may be acute. No hip fracture. Electronically Signed   By: Franchot Gallo M.D.   On: 07/17/2021 16:46    Review of Systems  Musculoskeletal:  Positive for arthralgias and back pain.  All other systems reviewed and are negative. Blood pressure 115/88, pulse 93, temperature 99.1 F (37.3 C), temperature source Oral, resp. rate 16, height 5' (1.524 m), weight 72.6 kg, SpO2 96 %. Physical Exam Vitals reviewed.  HENT:     Head: Normocephalic.     Right Ear: Tympanic membrane normal.     Nose: Nose normal.     Mouth/Throat:     Mouth: Mucous membranes are moist.  Eyes:     Pupils: Pupils are equal, round, and reactive to light.  Cardiovascular:     Rate and Rhythm: Normal rate.  Pulses: Normal pulses.   Pulmonary:     Effort: Pulmonary effort is normal.  Abdominal:     General: Abdomen is flat.  Musculoskeletal:     Cervical back: Normal range of motion.  Skin:    General: Skin is warm.     Capillary Refill: Capillary refill takes less than 2 seconds.  Neurological:     General: No focal deficit present.     Mental Status: She is alert.  Psychiatric:        Mood and Affect: Mood normal.  Examination of bilateral upper extremities demonstrates 5 out of 5 grip EPL FPL interosseous wrist flexion extension bicep triceps and deltoid strength.  Right lower extremity has good ankle hip and knee range of motion without pain or tenderness.  Left lower extremity has some groin pain with internal and external rotation of the leg.  No left knee effusion or ankle crepitus or swelling.  Ankle dorsiflexion plantarflexion intact.  Radial and pedal pulses palpable bilaterally.  Assessment/Plan: Impression is left rami fracture.  Anticipate 2 to 3-day hospitalization just to improve her mobilization.  Okay to be weightbearing as tolerated with physical therapy but she will not really fully weight-bear on this for at least a week or so.  She does live at home alone but daughter is nearby.  Anticipate home health physical therapy at the time of discharge.  She is very keen on returning home as soon as possible.  Okay for DVT prophylaxis as no surgery is imminent.  She will need to follow-up with [place] in about 3 weeks.  Also recommend checking vitamin D level and supplementing as necessary in order to facilitate fracture healing.  Landry Dyke Raford Brissett 07/18/2021, 9:23 AM

## 2021-07-18 NOTE — Progress Notes (Signed)
Patient admited from the ER with COVID positive result. Airborne and contact  precaution initiated.  Patient is alert and oriented x 4.Patient oriented to equipment and unit. Patient denies pain at this time. Call bell within reach. Will continue to monitor.

## 2021-07-18 NOTE — Progress Notes (Signed)
Pt with rami fxs Full consult to follow [facility] for PT for mobilization pwb as tolerated lle

## 2021-07-18 NOTE — Progress Notes (Signed)
[facility]  PROGRESS NOTE  Angelica Romero UXN:[id number] DOB: 21-May-1941 DOA: 07/17/2021 PCP: [name], MD   Brief HPI:   80 year old female with medical history of hypertension, hyperlipidemia, prediabetes, vitamin D deficiency, DJD, gout presented after mechanical fall.  Patient was getting up to feed her animals and tripped over a rug and fell onto her left hip and hit her head.  She was on the ground for 4 hours.  EMS was called. In the ED pelvic x-ray showed fracture of the left inferior pubic ramus, no rib fracture.  Orthopedics was consulted.  Also patient found to be positive for SARS COVID 2 RT-PCR.    Subjective   Patient seen, complains of cough.  Denies shortness of breath.   Assessment/Plan:     COVID-19 pneumonia -Not requiring oxygen -Chest x-ray shows bibasilar pneumonia -CRP elevated at 19.6; D-dimer 3.95 -We will start remdesivir per pharmacy consultation -Continue airborne precautions -Solu-Medrol 0.5 mg/kg IV every 12 hours -Follow CRP, D-dimer in a.m.  Pubic ramus fracture -Orthopedics consulted -Okay for weightbearing as tolerated with physical therapy -Likely home with home PT  Neck pain -Surgical CT was unremarkable for acute injury -Showed cervical spondylosis  Hypertension -Blood pressure is soft -Home medications on hold at this time  Gout -Continue allopurinol   Medications     allopurinol  150 mg Oral Daily   enoxaparin (LOVENOX) injection  40 mg Subcutaneous QHS   feeding supplement  237 mL Oral BID BM   methylPREDNISolone (SOLU-MEDROL) injection  0.5 mg/kg Intravenous Q12H   Followed by   Derrill Memo ON 07/21/2021] predniSONE  50 mg Oral Daily   multivitamin with minerals  1 tablet Oral Daily   sodium chloride flush  3 mL Intravenous Q12H     Data Reviewed:   CBG:  No results for input(s): GLUCAP in the last 168 hours.  SpO2: 98 %    Vitals:   07/17/21 2100 07/17/21 2155 07/18/21 0500 07/18/21 1501  BP:  124/62 112/74 115/88 121/62  Pulse: 90 91 93 91  Resp: (!) 28 17 16 17   Temp:  99.5 F (37.5 C) 99.1 F (37.3 C) 97.9 F (36.6 C)  TempSrc:   Oral Oral  SpO2: 96%   98%  Weight:      Height:         Intake/Output Summary (Last 24 hours) at 07/18/2021 1646 Last data filed at 07/18/2021 1600 Gross per 24 hour  Intake 168.58 ml  Output 800 ml  Net -631.42 ml    12/08 1901 - 12/10 0700 In: -  Out: 200 [Urine:200]  Filed Weights   07/17/21 1545  Weight: 72.6 kg    Data Reviewed: Basic Metabolic Panel: Recent Labs  Lab 07/17/21 1732 07/18/21 0234  NA 134* 133*  K 4.6 3.8  CL 102 102  CO2 20* 19*  GLUCOSE 88 99  BUN 23 21  CREATININE 0.88 0.78  CALCIUM 9.5 9.0   Liver Function Tests: No results for input(s): AST, ALT, ALKPHOS, BILITOT, PROT, ALBUMIN in the last 168 hours. No results for input(s): LIPASE, AMYLASE in the last 168 hours. No results for input(s): AMMONIA in the last 168 hours. CBC: Recent Labs  Lab 07/17/21 1732 07/18/21 0234  WBC 10.4 8.6  NEUTROABS 8.3*  --   HGB 12.3 11.7*  HCT 36.8 34.4*  MCV 95.3 93.7  PLT 321 346   Cardiac Enzymes: Recent Labs  Lab 07/17/21 1732  CKTOTAL 109   BNP (last 3 results) No results  for input(s): BNP in the last 8760 hours.  ProBNP (last 3 results) No results for input(s): PROBNP in the last 8760 hours.  CBG: No results for input(s): GLUCAP in the last 168 hours.     Radiology Reports  CT HEAD WO CONTRAST (5MM)  Result Date: 07/17/2021 CLINICAL DATA:  Head trauma, moderate-severe.  Fall. EXAM: CT HEAD WITHOUT CONTRAST TECHNIQUE: Contiguous axial images were obtained from the base of the skull through the vertex without intravenous contrast. COMPARISON:  None. FINDINGS: Brain: There is atrophy and chronic small vessel disease changes. No acute intracranial abnormality. Specifically, no hemorrhage, hydrocephalus, mass lesion, acute infarction, or significant intracranial injury. Vascular: No  hyperdense vessel or unexpected calcification. Skull: No acute calvarial abnormality. Sinuses/Orbits: Complete opacification of the left sphenoid sinus. Mucosal thickening in the posterior ethmoid air cells. Mucosal thickening and air-fluid level in the left maxillary sinus. Other: None IMPRESSION: Atrophy, chronic microvascular disease. No acute intracranial abnormality. Acute on chronic left sinusitis. Electronically Signed   By: Rolm Baptise M.D.   On: 07/17/2021 18:40   CT CERVICAL SPINE WO CONTRAST  Result Date: 07/17/2021 CLINICAL DATA:  Fall.  Neck trauma (Age >= 65y) EXAM: CT CERVICAL SPINE WITHOUT CONTRAST TECHNIQUE: Multidetector CT imaging of the cervical spine was performed without intravenous contrast. Multiplanar CT image reconstructions were also generated. COMPARISON:  None. FINDINGS: Alignment: Normal Skull base and vertebrae: No acute fracture. No primary bone lesion or focal pathologic process. Soft tissues and spinal canal: No prevertebral fluid or swelling. No visible canal hematoma. Disc levels: Moderate diffuse degenerative disc disease and facet disease, left greater than right. Upper chest: No acute findings Other: None IMPRESSION: Cervical spondylosis.  No acute bony abnormality. Electronically Signed   By: Rolm Baptise M.D.   On: 07/17/2021 18:41   DG Chest Port 1V same Day  Result Date: 07/18/2021 CLINICAL DATA:  COVID 19. EXAM: PORTABLE CHEST 1 VIEW COMPARISON:  07/21/2006 FINDINGS: Mild bibasilar airspace disease has developed since the prior study. Possible pneumonia. Heart size and vascularity normal.  No pleural effusion. IMPRESSION: Bibasilar airspace disease right greater than left compatible with pneumonia. Electronically Signed   By: Franchot Gallo M.D.   On: 07/18/2021 14:36   DG HIP UNILAT WITH PELVIS 2-3 VIEWS LEFT  Result Date: 07/17/2021 CLINICAL DATA:  Fall.  Left hip pain EXAM: DG HIP (WITH OR WITHOUT PELVIS) 2-3V LEFT COMPARISON:  07/21/2006 FINDINGS:  Negative for fracture of the left hip.  Normal hip joint space. There is a fracture of the left inferior pubic ramus which could be acute. This was not seen previously. No other pelvic fracture. IMPRESSION: Fracture left inferior pubic ramus which may be acute. No hip fracture. Electronically Signed   By: Franchot Gallo M.D.   On: 07/17/2021 16:46       Antibiotics: Anti-infectives (From admission, onward)    Start     Dose/Rate Route Frequency Ordered Stop   07/19/21 1000  remdesivir 100 mg in sodium chloride 0.9 % 100 mL IVPB  Status:  Discontinued       See Hyperspace for full Linked Orders Report.   100 mg 200 mL/hr over 30 Minutes Intravenous Daily 07/18/21 1334 07/18/21 1336   07/19/21 1000  remdesivir 100 mg in sodium chloride 0.9 % 100 mL IVPB       See Hyperspace for full Linked Orders Report.   100 mg 200 mL/hr over 30 Minutes Intravenous Daily 07/18/21 1338 07/23/21 0959   07/18/21 1430  remdesivir 200 mg  in sodium chloride 0.9% 250 mL IVPB  Status:  Discontinued       See Hyperspace for full Linked Orders Report.   200 mg 580 mL/hr over 30 Minutes Intravenous Once 07/18/21 1334 07/18/21 1336   07/18/21 1430  remdesivir 200 mg in sodium chloride 0.9% 250 mL IVPB       See Hyperspace for full Linked Orders Report.   200 mg 580 mL/hr over 30 Minutes Intravenous Once 07/18/21 1338 07/18/21 1612         DVT prophylaxis: Lovenox  Code Status: Full code  Family Communication: No family at bedside   Consultants: Orthopedics  Procedures:     Objective    Physical Examination:  General-appears in no acute distress Heart-S1-S2, regular, no murmur auscultated Lungs-bibasilar crackles auscultated Abdomen-soft, nontender, no organomegaly Extremities-no edema in the lower extremities Neuro-alert, oriented x3, no focal deficit noted  Status is: Inpatient  Dispo: The patient is from: Home              Anticipated d/c is to: Home              Anticipated d/c  date is: 07/21/2021              Patient currently not stable for discharge  Barrier to discharge-ongoing treatment for COVID-19 pneumonia  COVID-19 Labs  Recent Labs    07/18/21 1518  DDIMER 3.95*  CRP 19.6*    Lab Results  Component Value Date   SARSCOV2NAA POSITIVE (A) 07/17/2021   [place] Not Detected 07/31/2019            Recent Results (from the past 240 hour(s))  Resp Panel by RT-PCR (Flu A&B, Covid) Nasopharyngeal Swab     Status: Abnormal   Collection Time: 07/17/21  4:51 PM   Specimen: Nasopharyngeal Swab; Nasopharyngeal(NP) swabs in vial transport medium  Result Value Ref Range Status   SARS Coronavirus 2 by RT PCR POSITIVE (A) NEGATIVE Final    Comment: RESULT CALLED TO, READ BACK BY AND VERIFIED WITH: RN Jennefer Bravo [id number] MLM (NOTE) SARS-CoV-2 target nucleic acids are DETECTED.  The SARS-CoV-2 RNA is generally detectable in upper respiratory specimens during the acute phase of infection. Positive results are indicative of the presence of the identified virus, but do not rule out bacterial infection or co-infection with other pathogens not detected by the test. Clinical correlation with patient history and other diagnostic information is necessary to determine patient infection status. The expected result is Negative.  Fact Sheet for Patients: EntrepreneurPulse.com.au  Fact Sheet for Healthcare Providers: IncredibleEmployment.be  This test is not yet approved or cleared by the [place] FDA and  has been authorized for detection and/or diagnosis of SARS-CoV-2 by FDA under an Emergency Use Authorization (EUA).  This EUA will remain in effect (meaning this test can be used)  for the duration of  the COVID-19 declaration under Section 564(b)(1) of the Act, 21 U.S.C. section 360bbb-3(b)(1), unless the authorization is terminated or revoked sooner.     Influenza A by PCR NEGATIVE NEGATIVE Final    Influenza B by PCR NEGATIVE NEGATIVE Final    Comment: (NOTE) The Xpert Xpress SARS-CoV-2/FLU/RSV plus assay is intended as an aid in the diagnosis of influenza from Nasopharyngeal swab specimens and should not be used as a sole basis for treatment. Nasal washings and aspirates are unacceptable for Xpert Xpress SARS-CoV-2/FLU/RSV testing.  Fact Sheet for Patients: EntrepreneurPulse.com.au  Fact Sheet for Healthcare Providers: IncredibleEmployment.be  This test is not  yet approved or cleared by the [place] and has been authorized for detection and/or diagnosis of SARS-CoV-2 by FDA under an Emergency Use Authorization (EUA). This EUA will remain in effect (meaning this test can be used) for the duration of the COVID-19 declaration under Section 564(b)(1) of the Act, 21 U.S.C. section 360bbb-3(b)(1), unless the authorization is terminated or revoked.  Performed at [facility], [place] [address], [place], [place] [address]     [facility] If 7PM-7AM, please contact night-coverage at [email or web address], Office  [phone]   07/18/2021, 4:46 PM  LOS: 1 day

## 2021-07-19 DIAGNOSIS — R0989 Other specified symptoms and signs involving the circulatory and respiratory systems: Secondary | ICD-10-CM

## 2021-07-19 LAB — CBC WITH DIFFERENTIAL/PLATELET
Abs Immature Granulocytes: 0.03 10*3/uL (ref 0.00–0.07)
Basophils Absolute: 0 10*3/uL (ref 0.0–0.1)
Basophils Relative: 0 %
Eosinophils Absolute: 0 10*3/uL (ref 0.0–0.5)
Eosinophils Relative: 0 %
HCT: 34.8 % — ABNORMAL LOW (ref 36.0–46.0)
Hemoglobin: 12 g/dL (ref 12.0–15.0)
Immature Granulocytes: 0 %
Lymphocytes Relative: 7 %
Lymphs Abs: 0.5 10*3/uL — ABNORMAL LOW (ref 0.7–4.0)
MCH: 32.3 pg (ref 26.0–34.0)
MCHC: 34.5 g/dL (ref 30.0–36.0)
MCV: 93.8 fL (ref 80.0–100.0)
Monocytes Absolute: 0.1 10*3/uL (ref 0.1–1.0)
Monocytes Relative: 1 %
Neutro Abs: 7.4 10*3/uL (ref 1.7–7.7)
Neutrophils Relative %: 92 %
Platelets: 348 10*3/uL (ref 150–400)
RBC: 3.71 MIL/uL — ABNORMAL LOW (ref 3.87–5.11)
RDW: 14.3 % (ref 11.5–15.5)
WBC: 8.1 10*3/uL (ref 4.0–10.5)
nRBC: 0 % (ref 0.0–0.2)

## 2021-07-19 LAB — COMPREHENSIVE METABOLIC PANEL
ALT: 20 U/L (ref 0–44)
AST: 25 U/L (ref 15–41)
Albumin: 2.7 g/dL — ABNORMAL LOW (ref 3.5–5.0)
Alkaline Phosphatase: 81 U/L (ref 38–126)
Anion gap: 10 (ref 5–15)
BUN: 23 mg/dL (ref 8–23)
CO2: 20 mmol/L — ABNORMAL LOW (ref 22–32)
Calcium: 9.3 mg/dL (ref 8.9–10.3)
Chloride: 105 mmol/L (ref 98–111)
Creatinine, Ser: 0.88 mg/dL (ref 0.44–1.00)
GFR, Estimated: >60 mL/min (ref >60)
Glucose, Bld: 168 mg/dL — ABNORMAL HIGH (ref 70–99)
Potassium: 4.1 mmol/L (ref 3.5–5.1)
Sodium: 135 mmol/L (ref 135–145)
Total Bilirubin: 0.4 mg/dL (ref 0.3–1.2)
Total Protein: 6.5 g/dL (ref 6.5–8.1)

## 2021-07-19 LAB — D-DIMER, QUANTITATIVE: D-Dimer, Quant: 3.08 ug/mL-FEU — ABNORMAL HIGH (ref 0.00–0.50)

## 2021-07-19 LAB — C-REACTIVE PROTEIN: CRP: 19.5 mg/dL — ABNORMAL HIGH (ref <1.0)

## 2021-07-19 MED ORDER — ASPIRIN EC 81 MG PO TBEC
81.0000 mg | DELAYED_RELEASE_TABLET | Freq: Every day | ORAL | Status: DC
  Administered 2021-07-19 – 2021-07-23 (×5): 81 mg via ORAL
  Filled 2021-07-19 (×6): qty 1

## 2021-07-19 NOTE — Plan of Care (Signed)

## 2021-07-19 NOTE — Progress Notes (Signed)
[facility]  PROGRESS NOTE  Angelica Romero DHR:[id number] DOB: 05/03/1941 DOA: 07/17/2021 PCP: [name], MD   Brief HPI:   80 year old female with medical history of hypertension, hyperlipidemia, prediabetes, vitamin D deficiency, DJD, gout presented after mechanical fall.  Patient was getting up to feed her animals and tripped over a rug and fell onto her left hip and hit her head.  She was on the ground for 4 hours.  EMS was called. In the ED pelvic x-ray showed fracture of the left inferior pubic ramus, no rib fracture.  Orthopedics was consulted.  Also patient found to be positive for SARS COVID 2 RT-PCR.    Subjective   Patient seen and examined, denies shortness of breath.  Cough has improved.  Not requiring oxygen.  She was started on remdesivir and Solu-Medrol yesterday for COVID-19 pneumonia.   Assessment/Plan:     COVID-19 pneumonia -Not requiring oxygen -Chest x-ray shows bibasilar pneumonia -CRP and D-dimer continues to be elevated at 19.5/3.08 -Continue  remdesivir per pharmacy consultation -Continue airborne precautions -Solu-Medrol 0.5 mg/kg IV every 12 hours -Follow CRP, D-dimer in a.m.  Pubic ramus fracture -Orthopedics consulted -Okay for weightbearing as tolerated with physical therapy -Likely home with home PT versus skilled nursing facility  Neck pain -Surgical CT was unremarkable for acute injury -Showed cervical spondylosis  Hypertension -Blood pressure is soft -Home medications on hold at this time  Sinus tachycardia -EKG obtained this morning shows normal sinus rhythm -Patient asymptomatic  Gout -Continue allopurinol    Medications     allopurinol  150 mg Oral Daily   aspirin EC  81 mg Oral Daily   enoxaparin (LOVENOX) injection  40 mg Subcutaneous QHS   feeding supplement  237 mL Oral BID BM   methylPREDNISolone (SOLU-MEDROL) injection  0.5 mg/kg Intravenous Q12H   Followed by   Derrill Memo ON 07/21/2021] predniSONE  50 mg  Oral Daily   multivitamin with minerals  1 tablet Oral Daily   sodium chloride flush  3 mL Intravenous Q12H     Data Reviewed:   CBG:  No results for input(s): GLUCAP in the last 168 hours.  SpO2: 94 %    Vitals:   07/18/21 0500 07/18/21 1501 07/19/21 0100 07/19/21 0815  BP: 115/88 121/62 125/76 133/78  Pulse: 93 91 90 93  Resp: 16 17 19 17   Temp: 99.1 F (37.3 C) 97.9 F (36.6 C) 98.1 F (36.7 C) (!) 97.5 F (36.4 C)  TempSrc: Oral Oral Oral Oral  SpO2:  98% 98% 94%  Weight:      Height:         Intake/Output Summary (Last 24 hours) at 07/19/2021 1418 Last data filed at 07/18/2021 1600 Gross per 24 hour  Intake 168.58 ml  Output 600 ml  Net -431.42 ml    12/09 1901 - 12/11 0700 In: 168.6  Out: 800 [Urine:800]  Filed Weights   07/17/21 1545  Weight: 72.6 kg    Data Reviewed: Basic Metabolic Panel: Recent Labs  Lab 07/17/21 1732 07/18/21 0234 07/19/21 0202  NA 134* 133* 135  K 4.6 3.8 4.1  CL 102 102 105  CO2 20* 19* 20*  GLUCOSE 88 99 168*  BUN 23 21 23   CREATININE 0.88 0.78 0.88  CALCIUM 9.5 9.0 9.3   Liver Function Tests: Recent Labs  Lab 07/19/21 0202  AST 25  ALT 20  ALKPHOS 81  BILITOT 0.4  PROT 6.5  ALBUMIN 2.7*   No results for input(s): LIPASE, AMYLASE  in the last 168 hours. No results for input(s): AMMONIA in the last 168 hours. CBC: Recent Labs  Lab 07/17/21 1732 07/18/21 0234 07/19/21 0202  WBC 10.4 8.6 8.1  NEUTROABS 8.3*  --  7.4  HGB 12.3 11.7* 12.0  HCT 36.8 34.4* 34.8*  MCV 95.3 93.7 93.8  PLT 321 346 348   Cardiac Enzymes: Recent Labs  Lab 07/17/21 1732  CKTOTAL 109   BNP (last 3 results) No results for input(s): BNP in the last 8760 hours.  ProBNP (last 3 results) No results for input(s): PROBNP in the last 8760 hours.  CBG: No results for input(s): GLUCAP in the last 168 hours.     Radiology Reports  CT HEAD WO CONTRAST (5MM)  Result Date: 07/17/2021 CLINICAL DATA:  Head trauma,  moderate-severe.  Fall. EXAM: CT HEAD WITHOUT CONTRAST TECHNIQUE: Contiguous axial images were obtained from the base of the skull through the vertex without intravenous contrast. COMPARISON:  None. FINDINGS: Brain: There is atrophy and chronic small vessel disease changes. No acute intracranial abnormality. Specifically, no hemorrhage, hydrocephalus, mass lesion, acute infarction, or significant intracranial injury. Vascular: No hyperdense vessel or unexpected calcification. Skull: No acute calvarial abnormality. Sinuses/Orbits: Complete opacification of the left sphenoid sinus. Mucosal thickening in the posterior ethmoid air cells. Mucosal thickening and air-fluid level in the left maxillary sinus. Other: None IMPRESSION: Atrophy, chronic microvascular disease. No acute intracranial abnormality. Acute on chronic left sinusitis. Electronically Signed   By: Rolm Baptise M.D.   On: 07/17/2021 18:40   CT CERVICAL SPINE WO CONTRAST  Result Date: 07/17/2021 CLINICAL DATA:  Fall.  Neck trauma (Age >= 65y) EXAM: CT CERVICAL SPINE WITHOUT CONTRAST TECHNIQUE: Multidetector CT imaging of the cervical spine was performed without intravenous contrast. Multiplanar CT image reconstructions were also generated. COMPARISON:  None. FINDINGS: Alignment: Normal Skull base and vertebrae: No acute fracture. No primary bone lesion or focal pathologic process. Soft tissues and spinal canal: No prevertebral fluid or swelling. No visible canal hematoma. Disc levels: Moderate diffuse degenerative disc disease and facet disease, left greater than right. Upper chest: No acute findings Other: None IMPRESSION: Cervical spondylosis.  No acute bony abnormality. Electronically Signed   By: Rolm Baptise M.D.   On: 07/17/2021 18:41   DG Chest Port 1V same Day  Result Date: 07/18/2021 CLINICAL DATA:  COVID 19. EXAM: PORTABLE CHEST 1 VIEW COMPARISON:  07/21/2006 FINDINGS: Mild bibasilar airspace disease has developed since the prior study.  Possible pneumonia. Heart size and vascularity normal.  No pleural effusion. IMPRESSION: Bibasilar airspace disease right greater than left compatible with pneumonia. Electronically Signed   By: Franchot Gallo M.D.   On: 07/18/2021 14:36   DG HIP UNILAT WITH PELVIS 2-3 VIEWS LEFT  Result Date: 07/17/2021 CLINICAL DATA:  Fall.  Left hip pain EXAM: DG HIP (WITH OR WITHOUT PELVIS) 2-3V LEFT COMPARISON:  07/21/2006 FINDINGS: Negative for fracture of the left hip.  Normal hip joint space. There is a fracture of the left inferior pubic ramus which could be acute. This was not seen previously. No other pelvic fracture. IMPRESSION: Fracture left inferior pubic ramus which may be acute. No hip fracture. Electronically Signed   By: Franchot Gallo M.D.   On: 07/17/2021 16:46       Antibiotics: Anti-infectives (From admission, onward)    Start     Dose/Rate Route Frequency Ordered Stop   07/19/21 1000  remdesivir 100 mg in sodium chloride 0.9 % 100 mL IVPB  Status:  Discontinued  See Hyperspace for full Linked Orders Report.   100 mg 200 mL/hr over 30 Minutes Intravenous Daily 07/18/21 1334 07/18/21 1336   07/19/21 1000  remdesivir 100 mg in sodium chloride 0.9 % 100 mL IVPB       See Hyperspace for full Linked Orders Report.   100 mg 200 mL/hr over 30 Minutes Intravenous Daily 07/18/21 1338 07/23/21 0959   07/18/21 1430  remdesivir 200 mg in sodium chloride 0.9% 250 mL IVPB  Status:  Discontinued       See Hyperspace for full Linked Orders Report.   200 mg 580 mL/hr over 30 Minutes Intravenous Once 07/18/21 1334 07/18/21 1336   07/18/21 1430  remdesivir 200 mg in sodium chloride 0.9% 250 mL IVPB       See Hyperspace for full Linked Orders Report.   200 mg 580 mL/hr over 30 Minutes Intravenous Once 07/18/21 1338 07/18/21 1612         DVT prophylaxis: Lovenox  Code Status: Full code  Family Communication: No family at  bedside   Consultants: Orthopedics  Procedures:     Objective    Physical Examination:  General-appears in no acute distress Heart-S1-S2, regular, no murmur auscultated Lungs-clear to auscultation bilaterally, no wheezing or crackles auscultated Abdomen-soft, nontender, no organomegaly Extremities-no edema in the lower extremities Neuro-alert, oriented x3, no focal deficit noted  Status is: Inpatient  Dispo: The patient is from: Home              Anticipated d/c is to: Home              Anticipated d/c date is: 07/21/2021              Patient currently not stable for discharge  Barrier to discharge-ongoing treatment for COVID-19 pneumonia  COVID-19 Labs  Recent Labs    07/18/21 1518 07/19/21 0202  DDIMER 3.95* 3.08*  CRP 19.6* 19.5*    Lab Results  Component Value Date   SARSCOV2NAA POSITIVE (A) 07/17/2021   [place] Not Detected 07/31/2019            Recent Results (from the past 240 hour(s))  Resp Panel by RT-PCR (Flu A&B, Covid) Nasopharyngeal Swab     Status: Abnormal   Collection Time: 07/17/21  4:51 PM   Specimen: Nasopharyngeal Swab; Nasopharyngeal(NP) swabs in vial transport medium  Result Value Ref Range Status   SARS Coronavirus 2 by RT PCR POSITIVE (A) NEGATIVE Final    Comment: RESULT CALLED TO, READ BACK BY AND VERIFIED WITH: RN Jennefer Bravo [id number] 1914 MLM (NOTE) SARS-CoV-2 target nucleic acids are DETECTED.  The SARS-CoV-2 RNA is generally detectable in upper respiratory specimens during the acute phase of infection. Positive results are indicative of the presence of the identified virus, but do not rule out bacterial infection or co-infection with other pathogens not detected by the test. Clinical correlation with patient history and other diagnostic information is necessary to determine patient infection status. The expected result is Negative.  Fact Sheet for Patients: EntrepreneurPulse.com.au  Fact Sheet  for Healthcare Providers: IncredibleEmployment.be  This test is not yet approved or cleared by the [place] FDA and  has been authorized for detection and/or diagnosis of SARS-CoV-2 by FDA under an Emergency Use Authorization (EUA).  This EUA will remain in effect (meaning this test can be used)  for the duration of  the COVID-19 declaration under Section 564(b)(1) of the Act, 21 U.S.C. section 360bbb-3(b)(1), unless the authorization is terminated or revoked sooner.  Influenza A by PCR NEGATIVE NEGATIVE Final   Influenza B by PCR NEGATIVE NEGATIVE Final    Comment: (NOTE) The Xpert Xpress SARS-CoV-2/FLU/RSV plus assay is intended as an aid in the diagnosis of influenza from Nasopharyngeal swab specimens and should not be used as a sole basis for treatment. Nasal washings and aspirates are unacceptable for Xpert Xpress SARS-CoV-2/FLU/RSV testing.  Fact Sheet for Patients: EntrepreneurPulse.com.au  Fact Sheet for Healthcare Providers: IncredibleEmployment.be  This test is not yet approved or cleared by the [place] FDA and has been authorized for detection and/or diagnosis of SARS-CoV-2 by FDA under an Emergency Use Authorization (EUA). This EUA will remain in effect (meaning this test can be used) for the duration of the COVID-19 declaration under Section 564(b)(1) of the Act, 21 U.S.C. section 360bbb-3(b)(1), unless the authorization is terminated or revoked.  Performed at [facility], [place] [address], [place], [place] [address]     [facility] If 7PM-7AM, please contact night-coverage at [email or web address], Office  [phone]   07/19/2021, 2:18 PM  LOS: 2 days

## 2021-07-19 NOTE — TOC Initial Note (Signed)
Transition of Care [facility]) - Initial/Assessment Note    Patient Details  Name: Angelica Romero MRN: [id number] Date of Birth: 12-Feb-1941  Transition of Care [facility]) CM/SW Contact:    Elliot Gurney Kendrick, [place] Phone Number:[phone] 07/19/2021, 10:19 AM  Clinical Narrative:                 Consult received to follow up on SNF recommendation. Spoke to patient by phone,introduced self, SNF recommendation discussed. Patient has declined SNF at this time stating that she would rather return home with [facility] services. Patient states that she has a walker in the home. Her primary care provider is Dr. [name], she uses [facility] for her medication needs. She has a daughter and granddaughter who live locally that are able to assist. Phone call to patient's daughter, Ivery Quale confirms that she will be available to check on patient daily, bring her meals and to take care of any other care needs. She has also removed area rugs from the home to avoid falls. Med alert system discussed as well.  Transition of care to continue to follow for discharge needs.  Pao Haffey, LCSW Transition of Care [phone]   Expected Discharge Plan: [place] Barriers to Discharge: Continued Medical Work up   Patient Goals and CMS Choice Patient states their goals for this hospitalization and ongoing recovery are:: 'I am ready to go back home'      Expected Discharge Plan and Services Expected Discharge Plan: [place] Acute Care Choice: Durable Medical Equipment (3 walkers, bedside commode) Living arrangements for the past 2 months: Single Family Home                                      Prior Living Arrangements/Services Living arrangements for the past 2 months: Single Family Home Lives with:: Self Patient language and need for interpreter reviewed:: Yes Do you feel safe going back to the place where you live?: Yes       Need for Family Participation in Patient Care: No (Comment) Care giver support system in place?: Yes (comment)   Criminal Activity/Legal Involvement Pertinent to Current Situation/Hospitalization: No - Comment as needed  Activities of Daily Living Home Assistive Devices/Equipment: Walker (specify type) ADL Screening (condition at time of admission) Patient's cognitive ability adequate to safely complete daily activities?: Yes Is the patient deaf or have difficulty hearing?: No Does the patient have difficulty seeing, even when wearing glasses/contacts?: No Does the patient have difficulty concentrating, remembering, or making decisions?: Yes Patient able to express need for assistance with ADLs?: Yes Does the patient have difficulty dressing or bathing?: No Independently performs ADLs?: Yes (appropriate for developmental age) Does the patient have difficulty walking or climbing stairs?: No Weakness of Legs: None Weakness of Arms/Hands: None  Permission Sought/Granted   Permission granted to share information with : Yes, Verbal Permission Granted        Permission granted to share info w Relationship: carol medders  Permission granted to share info w Contact Information: [phone]  Emotional Assessment   Attitude/Demeanor/Rapport: Ambitious, Engaged, Self-Confident   Orientation: : Oriented to Self, Oriented to Place, Oriented to  Time, Oriented to Situation Alcohol / Substance Use: Not Applicable Psych Involvement: No (comment)  Admission diagnosis:  Fall [W19.XXXA] Pubic ramus fracture ([place]) [S32.599A] Pubic ramus fracture, left, closed, initial encounter (  [place]) [S32.592A] Patient Active Problem List   Diagnosis Date Noted   Pubic ramus fracture ([place]) 07/17/2021   Neck pain 07/17/2021   Osteopenia 06/03/2021   Abnormal glucose 06/09/2020   Prediabetes 06/09/2020   Obesity (BMI 30.0-34.9) 06/09/2020   Hyperuricemia 03/05/2020   DJD (degenerative joint disease),  multiple sites 07/14/2017   Hyperlipidemia, mixed 09/08/2016   Vitamin D deficiency 07/31/2015   Labile hypertension 01/02/2015   PCP:  [name], MD Pharmacy:   [facility] [place] ([place] [place] - [address] [place] [address] Phone: [phone] Fax: [phone]     Social Determinants of Health (SDOH) Interventions    Readmission Risk Interventions No flowsheet data found.

## 2021-07-20 LAB — CBC WITH DIFFERENTIAL/PLATELET
Abs Immature Granulocytes: 0.1 10*3/uL — ABNORMAL HIGH (ref 0.00–0.07)
Basophils Absolute: 0 10*3/uL (ref 0.0–0.1)
Basophils Relative: 0 %
Eosinophils Absolute: 0 10*3/uL (ref 0.0–0.5)
Eosinophils Relative: 0 %
HCT: 33.4 % — ABNORMAL LOW (ref 36.0–46.0)
Hemoglobin: 11.5 g/dL — ABNORMAL LOW (ref 12.0–15.0)
Immature Granulocytes: 1 %
Lymphocytes Relative: 11 %
Lymphs Abs: 1.5 10*3/uL (ref 0.7–4.0)
MCH: 32.5 pg (ref 26.0–34.0)
MCHC: 34.4 g/dL (ref 30.0–36.0)
MCV: 94.4 fL (ref 80.0–100.0)
Monocytes Absolute: 0.8 10*3/uL (ref 0.1–1.0)
Monocytes Relative: 6 %
Neutro Abs: 11.2 10*3/uL — ABNORMAL HIGH (ref 1.7–7.7)
Neutrophils Relative %: 82 %
Platelets: 395 10*3/uL (ref 150–400)
RBC: 3.54 MIL/uL — ABNORMAL LOW (ref 3.87–5.11)
RDW: 14.3 % (ref 11.5–15.5)
WBC: 13.6 10*3/uL — ABNORMAL HIGH (ref 4.0–10.5)
nRBC: 0 % (ref 0.0–0.2)

## 2021-07-20 LAB — COMPREHENSIVE METABOLIC PANEL
ALT: 17 U/L (ref 0–44)
AST: 19 U/L (ref 15–41)
Albumin: 2.4 g/dL — ABNORMAL LOW (ref 3.5–5.0)
Alkaline Phosphatase: 67 U/L (ref 38–126)
Anion gap: 7 (ref 5–15)
BUN: 29 mg/dL — ABNORMAL HIGH (ref 8–23)
CO2: 23 mmol/L (ref 22–32)
Calcium: 9 mg/dL (ref 8.9–10.3)
Chloride: 106 mmol/L (ref 98–111)
Creatinine, Ser: 0.86 mg/dL (ref 0.44–1.00)
GFR, Estimated: >60 mL/min (ref >60)
Glucose, Bld: 156 mg/dL — ABNORMAL HIGH (ref 70–99)
Potassium: 4.2 mmol/L (ref 3.5–5.1)
Sodium: 136 mmol/L (ref 135–145)
Total Bilirubin: 0.3 mg/dL (ref 0.3–1.2)
Total Protein: 5.9 g/dL — ABNORMAL LOW (ref 6.5–8.1)

## 2021-07-20 LAB — D-DIMER, QUANTITATIVE: D-Dimer, Quant: 2.62 ug/mL-FEU — ABNORMAL HIGH (ref 0.00–0.50)

## 2021-07-20 LAB — C-REACTIVE PROTEIN: CRP: 11.3 mg/dL — ABNORMAL HIGH (ref <1.0)

## 2021-07-20 NOTE — Progress Notes (Signed)
IVT consulted for difficult PIV placement. Pt currently eating breakfast in chair.  RN to put in new consult with pt is back in bed.

## 2021-07-20 NOTE — Progress Notes (Signed)
Physical Therapy Treatment Patient Details Name: Angelica Romero MRN: [id number] DOB: 1940/10/11 Today's Date: 07/20/2021   History of Present Illness Pt. is 80 yr old F admitted on 07/17/21 after fall resulting in inability to bear weight on L side. Imaging (+) for L inferior pubic ramus fx. Found to be COVID (+). PMH: HTN    PT Comments    Pt demos improved activity tolerance with pain better under control today.  Able to amb short distance in room with min A and use of RW.  Pt continues to refuse SNF and feels confident that with assist from her daughter and neighbor she will be able to manage at home.  Overall, pt demos dec balance, dec LE strength, dec activity tolerance and would benefit from continued skilled PT prior to return home to inc gait distance to household level and initiate step training as pt has 2 steps to enter home.  Pt will need HH PT, RW, BSC and min A with functional mobility/ADLs to safely manage at home.   Recommendations for follow up therapy are one component of a multi-disciplinary discharge planning process, led by the attending physician.  Recommendations may be updated based on patient status, additional functional criteria and insurance authorization.  Follow Up Recommendations  Home health PT (Pt. refuses SNF)     Assistance Recommended at Discharge Intermittent Supervision/Assistance  Equipment Recommendations  Rolling walker (2 wheels);BSC/3in1    Recommendations for Other Services       Precautions / Restrictions Precautions Precautions: Fall Restrictions Weight Bearing Restrictions: Yes LLE Weight Bearing: Weight bearing as tolerated     Mobility  Bed Mobility   Bed Mobility: Supine to Sit     Supine to sit: Min assist     General bed mobility comments: Pt. able to negotiate B LE over to EOB, req's min A for HH support to transition to sitting.  Scoots to EOB independently. Patient Response: Cooperative  Transfers Overall transfer  level: Needs assistance Equipment used: Rolling walker (2 wheels) Transfers: Sit to/from Stand Sit to Stand: Min assist           General transfer comment: Pt. performs sit > stand with min A.  VCs for safety with hand placement.  Demos good standing balance with use of RW.    Ambulation/Gait Ambulation/Gait assistance: Min assist Gait Distance (Feet): 5 Feet Assistive device: Rolling walker (2 wheels) Gait Pattern/deviations: Step-to pattern       General Gait Details: Pt. amb from EOB over to chair with VCs for appropriate gait sequencing.  Takes inc time to understand how to use RW appropraitely.  Demos fair balance with activity, requiring min A for safety.  Further gait not performed at this time secondary to pt being in the middle of eating breakfast when PT arrives and wanted to sit in chair to finish eating.   Stairs             Wheelchair Mobility    Modified Rankin (Stroke Patients Only)       Balance Overall balance assessment: Needs assistance Sitting-balance support: Bilateral upper extremity supported;Feet supported Sitting balance-Leahy Scale: Fair     Standing balance support: Bilateral upper extremity supported;During functional activity Standing balance-Leahy Scale: Poor                              Cognition Arousal/Alertness: Awake/alert Behavior During Therapy: WFL for tasks assessed/performed Overall Cognitive Status: Within Functional Limits for  tasks assessed                                 General Comments: Pt. is sitting up in bed eating breakfast when PT arrives.  PT expresses concern with pt's decision to decline SNF.  Pt. states she is feeling much better today and is agreeable to walk 5 ft over to chair to prove to PT she is doing better.        Exercises      General Comments General comments (skin integrity, edema, etc.): Long discussion about safety with return home.  Pt. states that her neighbor  is willing and able to help her as often as she needs throughout day.      Pertinent Vitals/Pain Pain Assessment: 0-10 Pain Score: 2  Pain Location: L hip Pain Descriptors / Indicators: Tender;Aching;Discomfort Pain Intervention(s): Limited activity within patient's tolerance;Monitored during session;Repositioned    Home Living                          Prior Function            PT Goals (current goals can now be found in the care plan section) Progress towards PT goals: Progressing toward goals    Frequency    Min 5X/week      PT Plan Discharge plan needs to be updated    Co-evaluation              AM-PAC PT "6 Clicks" Mobility   Outcome Measure  Help needed turning from your back to your side while in a flat bed without using bedrails?: A Little Help needed moving from lying on your back to sitting on the side of a flat bed without using bedrails?: A Little Help needed moving to and from a bed to a chair (including a wheelchair)?: A Little Help needed standing up from a chair using your arms (e.g., wheelchair or bedside chair)?: A Little Help needed to walk in hospital room?: A Little Help needed climbing 3-5 steps with a railing? : A Lot 6 Click Score: 17    End of Session Equipment Utilized During Treatment: Gait belt Activity Tolerance: Patient tolerated treatment well Patient left: in chair;with call bell/phone within reach;with chair alarm set         Time: [id number] PT Time Calculation (min) (ACUTE ONLY): 27 min  Charges:  $Gait Training: 8-22 mins $Therapeutic Activity: 8-22 mins                     Sherlin Sonier A. Kymberlee Viger, PT, DPT Acute Rehabilitation Services Office: [place] 07/20/2021, 11:04 AM

## 2021-07-20 NOTE — Plan of Care (Signed)

## 2021-07-20 NOTE — Progress Notes (Signed)
[facility]  PROGRESS NOTE  Angelica Romero JAS:[id number] DOB: 1940-10-22 DOA: 07/17/2021 PCP: [name], MD   Brief HPI:   80 year old female with medical history of hypertension, hyperlipidemia, prediabetes, vitamin D deficiency, DJD, gout presented after mechanical fall.  Patient was getting up to feed her animals and tripped over a rug and fell onto her left hip and hit her head.  She was on the ground for 4 hours.  EMS was called. In the ED pelvic x-ray showed fracture of the left inferior pubic ramus, no rib fracture.  Orthopedics was consulted.  Also patient found to be positive for SARS COVID 2 RT-PCR.  Patient did not get COVID-vaccine  Subjective   Patient seen and examined, denies shortness of breath.  Coughing has improved.   Assessment/Plan:     COVID-19 pneumonia -Not requiring oxygen -Chest x-ray shows bibasilar pneumonia -CRP and D-dimer was elevated at 19.5/3.08; today i CRP is down to 11, D-dimer 2.62 -Continue  remdesivir per pharmacy consultation -Continue airborne precautions -Solu-Medrol 0.5 mg/kg IV every 12 hours -Follow CRP, D-dimer in a.m.  Pubic ramus fracture -Orthopedics consulted -Okay for weightbearing as tolerated with physical therapy -Likely home with home PT versus skilled nursing facility -Patient refusing to go to skilled nursing facility  Neck pain -Surgical CT was unremarkable for acute injury -Showed cervical spondylosis  Hypertension -Blood pressure is soft -Home medications on hold at this time  Leukocytosis -WBC elevated at 13.6 -Likely from steroids  Sinus tachycardia -EKG obtained this morning shows normal sinus rhythm -Patient asymptomatic  Gout -Continue allopurinol    Medications     allopurinol  150 mg Oral Daily   aspirin EC  81 mg Oral Daily   enoxaparin (LOVENOX) injection  40 mg Subcutaneous QHS   feeding supplement  237 mL Oral BID BM   methylPREDNISolone (SOLU-MEDROL) injection  0.5 mg/kg  Intravenous Q12H   Followed by   Derrill Memo ON 07/21/2021] predniSONE  50 mg Oral Daily   multivitamin with minerals  1 tablet Oral Daily   sodium chloride flush  3 mL Intravenous Q12H     Data Reviewed:   CBG:  No results for input(s): GLUCAP in the last 168 hours.  SpO2: 95 %    Vitals:   07/19/21 0815 07/20/21 0529 07/20/21 0802 07/20/21 1222  BP: 133/78 118/61 134/79 135/90  Pulse: 93 93 78 87  Resp: 17 16 17 16   Temp: (!) 97.5 F (36.4 C) 98 F (36.7 C) 98.3 F (36.8 C) 97.7 F (36.5 C)  TempSrc: Oral Oral Oral Oral  SpO2: 94% 98% 93% 95%  Weight:      Height:         Intake/Output Summary (Last 24 hours) at 07/20/2021 1523 Last data filed at 07/19/2021 1600 Gross per 24 hour  Intake 100 ml  Output --  Net 100 ml    12/10 1901 - 12/12 0700 In: 340 [P.O.:240] Out: -   Filed Weights   07/17/21 1545  Weight: 72.6 kg    Data Reviewed: Basic Metabolic Panel: Recent Labs  Lab 07/17/21 1732 07/18/21 0234 07/19/21 0202 07/20/21 0320  NA 134* 133* 135 136  K 4.6 3.8 4.1 4.2  CL 102 102 105 106  CO2 20* 19* 20* 23  GLUCOSE 88 99 168* 156*  BUN 23 21 23  29*  CREATININE 0.88 0.78 0.88 0.86  CALCIUM 9.5 9.0 9.3 9.0   Liver Function Tests: Recent Labs  Lab 07/19/21 0202 07/20/21 0320  AST 25  19  ALT 20 17  ALKPHOS 81 67  BILITOT 0.4 0.3  PROT 6.5 5.9*  ALBUMIN 2.7* 2.4*   No results for input(s): LIPASE, AMYLASE in the last 168 hours. No results for input(s): AMMONIA in the last 168 hours. CBC: Recent Labs  Lab 07/17/21 1732 07/18/21 0234 07/19/21 0202 07/20/21 0320  WBC 10.4 8.6 8.1 13.6*  NEUTROABS 8.3*  --  7.4 11.2*  HGB 12.3 11.7* 12.0 11.5*  HCT 36.8 34.4* 34.8* 33.4*  MCV 95.3 93.7 93.8 94.4  PLT 321 346 348 395   Cardiac Enzymes: Recent Labs  Lab 07/17/21 1732  CKTOTAL 109   BNP (last 3 results) No results for input(s): BNP in the last 8760 hours.  ProBNP (last 3 results) No results for input(s): PROBNP in the last  8760 hours.  CBG: No results for input(s): GLUCAP in the last 168 hours.     Radiology Reports  No results found.     Antibiotics: Anti-infectives (From admission, onward)    Start     Dose/Rate Route Frequency Ordered Stop   07/19/21 1000  remdesivir 100 mg in sodium chloride 0.9 % 100 mL IVPB  Status:  Discontinued       See Hyperspace for full Linked Orders Report.   100 mg 200 mL/hr over 30 Minutes Intravenous Daily 07/18/21 1334 07/18/21 1336   07/19/21 1000  remdesivir 100 mg in sodium chloride 0.9 % 100 mL IVPB       See Hyperspace for full Linked Orders Report.   100 mg 200 mL/hr over 30 Minutes Intravenous Daily 07/18/21 1338 07/23/21 0959   07/18/21 1430  remdesivir 200 mg in sodium chloride 0.9% 250 mL IVPB  Status:  Discontinued       See Hyperspace for full Linked Orders Report.   200 mg 580 mL/hr over 30 Minutes Intravenous Once 07/18/21 1334 07/18/21 1336   07/18/21 1430  remdesivir 200 mg in sodium chloride 0.9% 250 mL IVPB       See Hyperspace for full Linked Orders Report.   200 mg 580 mL/hr over 30 Minutes Intravenous Once 07/18/21 1338 07/18/21 1612         DVT prophylaxis: Lovenox  Code Status: Full code  Family Communication: No family at bedside   Consultants: Orthopedics  Procedures:     Objective    Physical Examination:  General-appears in no acute distress Heart-S1-S2, regular, no murmur auscultated Lungs-clear to auscultation bilaterally, no wheezing or crackles auscultated Abdomen-soft, nontender, no organomegaly Extremities-no edema in the lower extremities Neuro-alert, oriented x3, no focal deficit noted  Status is: Inpatient  Dispo: The patient is from: Home              Anticipated d/c is to: Home              Anticipated d/c date is: 07/21/2021              Patient currently not stable for discharge  Barrier to discharge-ongoing treatment for COVID-19 pneumonia  COVID-19 Labs  Recent Labs     07/18/21 1518 07/19/21 0202 07/20/21 0320  DDIMER 3.95* 3.08* 2.62*  CRP 19.6* 19.5* 11.3*    Lab Results  Component Value Date   SARSCOV2NAA POSITIVE (A) 07/17/2021   [place] Not Detected 07/31/2019            Recent Results (from the past 240 hour(s))  Resp Panel by RT-PCR (Flu A&B, Covid) Nasopharyngeal Swab     Status: Abnormal   Collection  Time: 07/17/21  4:51 PM   Specimen: Nasopharyngeal Swab; Nasopharyngeal(NP) swabs in vial transport medium  Result Value Ref Range Status   SARS Coronavirus 2 by RT PCR POSITIVE (A) NEGATIVE Final    Comment: RESULT CALLED TO, READ BACK BY AND VERIFIED WITH: RN Jennefer Bravo [phone] 1914 MLM (NOTE) SARS-CoV-2 target nucleic acids are DETECTED.  The SARS-CoV-2 RNA is generally detectable in upper respiratory specimens during the acute phase of infection. Positive results are indicative of the presence of the identified virus, but do not rule out bacterial infection or co-infection with other pathogens not detected by the test. Clinical correlation with patient history and other diagnostic information is necessary to determine patient infection status. The expected result is Negative.  Fact Sheet for Patients: EntrepreneurPulse.com.au  Fact Sheet for Healthcare Providers: IncredibleEmployment.be  This test is not yet approved or cleared by the [place] FDA and  has been authorized for detection and/or diagnosis of SARS-CoV-2 by FDA under an Emergency Use Authorization (EUA).  This EUA will remain in effect (meaning this test can be used)  for the duration of  the COVID-19 declaration under Section 564(b)(1) of the Act, 21 U.S.C. section 360bbb-3(b)(1), unless the authorization is terminated or revoked sooner.     Influenza A by PCR NEGATIVE NEGATIVE Final   Influenza B by PCR NEGATIVE NEGATIVE Final    Comment: (NOTE) The Xpert Xpress SARS-CoV-2/FLU/RSV plus assay is intended as an  aid in the diagnosis of influenza from Nasopharyngeal swab specimens and should not be used as a sole basis for treatment. Nasal washings and aspirates are unacceptable for Xpert Xpress SARS-CoV-2/FLU/RSV testing.  Fact Sheet for Patients: EntrepreneurPulse.com.au  Fact Sheet for Healthcare Providers: IncredibleEmployment.be  This test is not yet approved or cleared by the [place] FDA and has been authorized for detection and/or diagnosis of SARS-CoV-2 by FDA under an Emergency Use Authorization (EUA). This EUA will remain in effect (meaning this test can be used) for the duration of the COVID-19 declaration under Section 564(b)(1) of the Act, 21 U.S.C. section 360bbb-3(b)(1), unless the authorization is terminated or revoked.  Performed at [facility], [place] [address], [place], [place] [address]     [facility] If 7PM-7AM, please contact night-coverage at [email or web address], Office  [phone]   07/20/2021, 3:23 PM  LOS: 3 days

## 2021-07-20 NOTE — TOC CAGE-AID Note (Signed)
Transition of Care [facility]) - CAGE-AID Screening   Patient Details  Name: SANDIE SWAYZE MRN: [id number] Date of Birth: Sep 26, 1940  Transition of Care [facility]) CM/SW Contact:    Bethann Berkshire, [place] Phone Number: 07/20/2021, 9:06 AM   Clinical Narrative:  CAGE-AID Completed over the phone. Pt denies any alcohol or other substance use. Explains she only drinks coffee and water.   CAGE-AID Screening:    Have You Ever Felt You Ought to Cut Down on Your Drinking or Drug Use?: No Have People Annoyed You By Critizing Your Drinking Or Drug Use?: No Have You Felt Bad Or Guilty About Your Drinking Or Drug Use?: No Have You Ever Had a Drink or Used Drugs First Thing In The Morning to Steady Your Nerves or to Get Rid of a Hangover?: No CAGE-AID Score: 0  Substance Abuse Education Offered: No

## 2021-07-21 DIAGNOSIS — J1282 Pneumonia due to coronavirus disease 2019: Secondary | ICD-10-CM

## 2021-07-21 DIAGNOSIS — E782 Mixed hyperlipidemia: Secondary | ICD-10-CM

## 2021-07-21 DIAGNOSIS — R7303 Prediabetes: Secondary | ICD-10-CM

## 2021-07-21 DIAGNOSIS — M542 Cervicalgia: Secondary | ICD-10-CM

## 2021-07-21 LAB — CBC WITH DIFFERENTIAL/PLATELET
Abs Immature Granulocytes: 0.09 10*3/uL — ABNORMAL HIGH (ref 0.00–0.07)
Basophils Absolute: 0 10*3/uL (ref 0.0–0.1)
Basophils Relative: 0 %
Eosinophils Absolute: 0 10*3/uL (ref 0.0–0.5)
Eosinophils Relative: 0 %
HCT: 34.1 % — ABNORMAL LOW (ref 36.0–46.0)
Hemoglobin: 11.6 g/dL — ABNORMAL LOW (ref 12.0–15.0)
Immature Granulocytes: 1 %
Lymphocytes Relative: 10 %
Lymphs Abs: 1.1 10*3/uL (ref 0.7–4.0)
MCH: 32.5 pg (ref 26.0–34.0)
MCHC: 34 g/dL (ref 30.0–36.0)
MCV: 95.5 fL (ref 80.0–100.0)
Monocytes Absolute: 0.3 10*3/uL (ref 0.1–1.0)
Monocytes Relative: 2 %
Neutro Abs: 9.7 10*3/uL — ABNORMAL HIGH (ref 1.7–7.7)
Neutrophils Relative %: 87 %
Platelets: 447 10*3/uL — ABNORMAL HIGH (ref 150–400)
RBC: 3.57 MIL/uL — ABNORMAL LOW (ref 3.87–5.11)
RDW: 14.4 % (ref 11.5–15.5)
WBC: 11.1 10*3/uL — ABNORMAL HIGH (ref 4.0–10.5)
nRBC: 0 % (ref 0.0–0.2)

## 2021-07-21 LAB — COMPREHENSIVE METABOLIC PANEL
ALT: 21 U/L (ref 0–44)
AST: 23 U/L (ref 15–41)
Albumin: 2.4 g/dL — ABNORMAL LOW (ref 3.5–5.0)
Alkaline Phosphatase: 69 U/L (ref 38–126)
Anion gap: 8 (ref 5–15)
BUN: 34 mg/dL — ABNORMAL HIGH (ref 8–23)
CO2: 23 mmol/L (ref 22–32)
Calcium: 9.3 mg/dL (ref 8.9–10.3)
Chloride: 105 mmol/L (ref 98–111)
Creatinine, Ser: 0.83 mg/dL (ref 0.44–1.00)
GFR, Estimated: >60 mL/min (ref >60)
Glucose, Bld: 179 mg/dL — ABNORMAL HIGH (ref 70–99)
Potassium: 4.8 mmol/L (ref 3.5–5.1)
Sodium: 136 mmol/L (ref 135–145)
Total Bilirubin: 0.3 mg/dL (ref 0.3–1.2)
Total Protein: 5.9 g/dL — ABNORMAL LOW (ref 6.5–8.1)

## 2021-07-21 LAB — C-REACTIVE PROTEIN: CRP: 5.9 mg/dL — ABNORMAL HIGH (ref <1.0)

## 2021-07-21 LAB — D-DIMER, QUANTITATIVE: D-Dimer, Quant: 2.1 ug/mL-FEU — ABNORMAL HIGH (ref 0.00–0.50)

## 2021-07-21 NOTE — Progress Notes (Signed)
PROGRESS NOTE  Angelica Romero TMH:[id number] DOB: Oct 20, 1940 DOA: 07/17/2021 PCP: [name], MD  Brief History   80 year old female with medical history of hypertension, hyperlipidemia, prediabetes, vitamin D deficiency, DJD, gout presented after mechanical fall.  Patient was getting up to feed her animals and tripped over a rug and fell onto her left hip and hit her head.  She was on the ground for 4 hours.  EMS was called. In the ED pelvic x-ray showed fracture of the left inferior pubic ramus, no rib fracture.  Orthopedics was consulted.  Also patient found to be positive for SARS COVID 2 RT-PCR.  The patient is saturating 97% on room air. She has been evaluated by PT and recommendation is for home with home health with PT/OT. The patient states that she lives alone, but that her daughter and a neighbor have said that they will come by to help.   Consult to [facility] in place.   Consultants  Orthopedic surgery  Procedures  None  Antibiotics   Anti-infectives (From admission, onward)    Start     Dose/Rate Route Frequency Ordered Stop   07/19/21 1000  remdesivir 100 mg in sodium chloride 0.9 % 100 mL IVPB  Status:  Discontinued       See Hyperspace for full Linked Orders Report.   100 mg 200 mL/hr over 30 Minutes Intravenous Daily 07/18/21 1334 07/18/21 1336   07/19/21 1000  remdesivir 100 mg in sodium chloride 0.9 % 100 mL IVPB       See Hyperspace for full Linked Orders Report.   100 mg 200 mL/hr over 30 Minutes Intravenous Daily 07/18/21 1338 07/23/21 0959   07/18/21 1430  remdesivir 200 mg in sodium chloride 0.9% 250 mL IVPB  Status:  Discontinued       See Hyperspace for full Linked Orders Report.   200 mg 580 mL/hr over 30 Minutes Intravenous Once 07/18/21 1334 07/18/21 1336   07/18/21 1430  remdesivir 200 mg in sodium chloride 0.9% 250 mL IVPB       See Hyperspace for full Linked Orders Report.   200 mg 580 mL/hr over 30 Minutes Intravenous Once 07/18/21 1338 07/18/21  1612      Subjective  The patient is sitting up in a chair at bedside. No new complaints.  Objective   Vitals:  Vitals:   07/21/21 0458 07/21/21 0821  BP: 114/68 137/80  Pulse: 85 100  Resp: 18 17  Temp: 98.2 F (36.8 C) 97.6 F (36.4 C)  SpO2: 98% 97%    Exam:  Constitutional:  The patient is awake, alert, and oriented x 3. No acute distress. Respiratory:  No increased work of breathing. No wheezes, rales, or rhonchi No tactile fremitus Cardiovascular:  Regular rate and rhythm No murmurs, ectopy, or gallups. No lateral PMI. No thrills. Abdomen:  Abdomen is soft, non-tender, non-distended No hernias, masses, or organomegaly Normoactive bowel sounds.  Musculoskeletal:  No cyanosis, clubbing, or edema Skin:  No rashes, lesions, ulcers palpation of skin: no induration or nodules Neurologic:  CN 2-12 intact Sensation all 4 extremities intact Psychiatric:  Mental status Mood, affect appropriate Orientation to person, place, time  judgment and insight appear intact   I have personally reviewed the following:   Today's Data  Vitals  Lab Data  CMP, CBC  Micro Data  + for COVID-19  Imaging  CXR  Cardiology Data  EKG  Scheduled Meds:  allopurinol  150 mg Oral Daily   aspirin EC  81 mg Oral Daily   enoxaparin (LOVENOX) injection  40 mg Subcutaneous QHS   feeding supplement  237 mL Oral BID BM   multivitamin with minerals  1 tablet Oral Daily   predniSONE  50 mg Oral Daily   sodium chloride flush  3 mL Intravenous Q12H   Continuous Infusions:  sodium chloride     remdesivir 100 mg in NS 100 mL 100 mg (07/21/21 0955)    Principal Problem:   Pubic ramus fracture (HCC) Active Problems:   Labile hypertension   Hyperlipidemia, mixed   Prediabetes   Neck pain   COVID-19 Positive   LOS: 4 days   A & P  Pubic ramus fracture -Orthopedics consulted -Okay for weightbearing as tolerated with physical therapy -Likely home with home PT versus  skilled nursing facility -Patient refusing to go to skilled nursing facility   Neck pain -Surgical CT was unremarkable for acute injury -Showed cervical spondylosis   Hypertension -Blood pressure is soft -Home medications on hold at this time   Leukocytosis -WBC elevated at 11.1 -Improving -Likely from steroids  COVID-19 - Day 3/5 of Remdesevir - Day 3/10 of Steroids.   Sinus tachycardia -EKG obtained this morning shows normal sinus rhythm -Patient asymptomatic   Gout -Continue allopurinol    I have seen and examined this patient myself. I have spent 34 minutes in her evaluation and care.  DVT prophylaxis: Lovenox Code Status: Full Code Family Communication: None available Disposition Plan: Home with home health PT.    Merdith Boyd, DO [facility] Direct contact: see [email or web address]  7PM-7AM contact night coverage as above 07/21/2021, 5:20 PM  LOS: 4 days

## 2021-07-21 NOTE — Progress Notes (Signed)
Physical Therapy Treatment Patient Details Name: Angelica Romero MRN: [id number] DOB: 1941/03/06 Today's Date: 07/21/2021   History of Present Illness Pt. is 80 yr old F admitted on 07/17/21 after fall resulting in inability to bear weight on L side. Imaging (+) for L inferior pubic ramus fx. Found to be COVID (+). PMH: HTN    PT Comments    Patient progressing well towards PT goals. Session focused on gait training with use of RW and Min guard assist for safety. Very slow and guarded gait but no evidence of imbalance. VSS on RA with mild cough noted with activity. Encouraged walking to bathroom with nursing, pain management and performing there ex as well.  Pt will have support at home from daughter and neighbors starting tomorrow per report. Will follow.   Recommendations for follow up therapy are one component of a multi-disciplinary discharge planning process, led by the attending physician.  Recommendations may be updated based on patient status, additional functional criteria and insurance authorization.  Follow Up Recommendations  Home health PT     Assistance Recommended at Discharge Intermittent Supervision/Assistance  Equipment Recommendations  Rolling walker (2 wheels);BSC/3in1    Recommendations for Other Services       Precautions / Restrictions Precautions Precautions: Fall Restrictions Weight Bearing Restrictions: Yes LLE Weight Bearing: Weight bearing as tolerated     Mobility  Bed Mobility               General bed mobility comments: Up in chair upon PT arrival.    Transfers Overall transfer level: Needs assistance Equipment used: Rolling walker (2 wheels) Transfers: Sit to/from Stand Sit to Stand: Min guard           General transfer comment: Min guard for safety. Stood from chair x1, cues for hand placement. Slow to rise.    Ambulation/Gait Ambulation/Gait assistance: Min guard Gait Distance (Feet): 45 Feet Assistive device: Rolling walker  (2 wheels) Gait Pattern/deviations: Step-through pattern;Decreased stance time - left;Decreased step length - right Gait velocity: decreased Gait velocity interpretation: <1.31 ft/sec, indicative of household ambulator   General Gait Details: very slow and guarded gait with decreased weight shift/WB through LLE, no evidence of imbalance. No SOB. VSS on RA.   Stairs             Wheelchair Mobility    Modified Rankin (Stroke Patients Only)       Balance Overall balance assessment: Needs assistance Sitting-balance support: Feet supported;No upper extremity supported Sitting balance-Leahy Scale: Good     Standing balance support: During functional activity;Reliant on assistive device for balance Standing balance-Leahy Scale: Poor Standing balance comment: Requires UE support in standing.                            Cognition Arousal/Alertness: Awake/alert Behavior During Therapy: WFL for tasks assessed/performed Overall Cognitive Status: Within Functional Limits for tasks assessed                                          Exercises General Exercises - Lower Extremity Long Arc Quad: AROM;Left;10 reps;Seated    General Comments General comments (skin integrity, edema, etc.): Pt report she cannot return home today cause her daughter is working but tomorrow she can go. Has some help she can get.      Pertinent Vitals/Pain Pain Assessment: 0-10 Pain  Score: 8  Pain Location: Lft hip Pain Descriptors / Indicators: Tender;Aching;Discomfort;Sore Pain Intervention(s): Monitored during session;Repositioned    Home Living                          Prior Function            PT Goals (current goals can now be found in the care plan section) Progress towards PT goals: Progressing toward goals    Frequency    Min 5X/week      PT Plan Current plan remains appropriate    Co-evaluation              AM-PAC PT "6 Clicks"  Mobility   Outcome Measure  Help needed turning from your back to your side while in a flat bed without using bedrails?: A Little Help needed moving from lying on your back to sitting on the side of a flat bed without using bedrails?: A Little Help needed moving to and from a bed to a chair (including a wheelchair)?: A Little Help needed standing up from a chair using your arms (e.g., wheelchair or bedside chair)?: A Little Help needed to walk in hospital room?: A Little Help needed climbing 3-5 steps with a railing? : A Little 6 Click Score: 18    End of Session Equipment Utilized During Treatment: Gait belt Activity Tolerance: Patient tolerated treatment well Patient left: in chair;with call bell/phone within reach;with chair alarm set Nurse Communication: Mobility status PT Visit Diagnosis: Unsteadiness on feet (R26.81);History of falling (Z91.81);Other abnormalities of gait and mobility (R26.89);Pain Pain - Right/Left: Left Pain - part of body: Hip     Time: 9702-6378 PT Time Calculation (min) (ACUTE ONLY): 24 min  Charges:  $Gait Training: 23-37 mins                     Marisa Severin, PT, DPT Acute Rehabilitation Services Pager [phone] Office [place] 07/21/2021, 9:58 AM

## 2021-07-21 NOTE — Care Management Important Message (Signed)
Important Message  Patient Details  Name: Angelica Romero MRN: [id number] Date of Birth: [birth year]   Medicare Important Message Given:  Yes     Hannah Beat 07/21/2021, 1:49 PM

## 2021-07-22 LAB — D-DIMER, QUANTITATIVE: D-Dimer, Quant: 2.51 ug/mL-FEU — ABNORMAL HIGH (ref 0.00–0.50)

## 2021-07-22 LAB — CBC WITH DIFFERENTIAL/PLATELET
Abs Immature Granulocytes: 0.25 10*3/uL — ABNORMAL HIGH (ref 0.00–0.07)
Basophils Absolute: 0 10*3/uL (ref 0.0–0.1)
Basophils Relative: 0 %
Eosinophils Absolute: 0 10*3/uL (ref 0.0–0.5)
Eosinophils Relative: 0 %
HCT: 35.3 % — ABNORMAL LOW (ref 36.0–46.0)
Hemoglobin: 12.1 g/dL (ref 12.0–15.0)
Immature Granulocytes: 2 %
Lymphocytes Relative: 11 %
Lymphs Abs: 1.4 10*3/uL (ref 0.7–4.0)
MCH: 32.5 pg (ref 26.0–34.0)
MCHC: 34.3 g/dL (ref 30.0–36.0)
MCV: 94.9 fL (ref 80.0–100.0)
Monocytes Absolute: 0.4 10*3/uL (ref 0.1–1.0)
Monocytes Relative: 3 %
Neutro Abs: 10.5 10*3/uL — ABNORMAL HIGH (ref 1.7–7.7)
Neutrophils Relative %: 84 %
Platelets: 481 10*3/uL — ABNORMAL HIGH (ref 150–400)
RBC: 3.72 MIL/uL — ABNORMAL LOW (ref 3.87–5.11)
RDW: 14.5 % (ref 11.5–15.5)
WBC: 12.5 10*3/uL — ABNORMAL HIGH (ref 4.0–10.5)
nRBC: 0 % (ref 0.0–0.2)

## 2021-07-22 LAB — COMPREHENSIVE METABOLIC PANEL
ALT: 22 U/L (ref 0–44)
AST: 25 U/L (ref 15–41)
Albumin: 2.5 g/dL — ABNORMAL LOW (ref 3.5–5.0)
Alkaline Phosphatase: 75 U/L (ref 38–126)
Anion gap: 6 (ref 5–15)
BUN: 33 mg/dL — ABNORMAL HIGH (ref 8–23)
CO2: 24 mmol/L (ref 22–32)
Calcium: 9.2 mg/dL (ref 8.9–10.3)
Chloride: 105 mmol/L (ref 98–111)
Creatinine, Ser: 0.9 mg/dL (ref 0.44–1.00)
GFR, Estimated: >60 mL/min (ref >60)
Glucose, Bld: 163 mg/dL — ABNORMAL HIGH (ref 70–99)
Potassium: 4.8 mmol/L (ref 3.5–5.1)
Sodium: 135 mmol/L (ref 135–145)
Total Bilirubin: 0.3 mg/dL (ref 0.3–1.2)
Total Protein: 5.8 g/dL — ABNORMAL LOW (ref 6.5–8.1)

## 2021-07-22 LAB — C-REACTIVE PROTEIN: CRP: 3.4 mg/dL — ABNORMAL HIGH (ref <1.0)

## 2021-07-22 NOTE — Progress Notes (Signed)
Physical Therapy Treatment Patient Details Name: Angelica Romero MRN: [id number] DOB: [birth year] Today's Date: 07/22/2021   History of Present Illness Pt. is 80 yr old F admitted on 07/17/21 after fall resulting in inability to bear weight on L side. Imaging (+) for L inferior pubic ramus fx. Found to be COVID (+). PMH: HTN    PT Comments    Pt admitted with above diagnosis. Pt was able to ambulate with RW with min guard assist to supervision. Pt progressing well and did not need physical assist. Pt stating she feels comfortable caring for self at home.  Pt currently with functional limitations due to tbalance and endurance deficits. Pt will benefit from skilled PT to increase their independence and safety with mobility to allow discharge to the venue listed below.      Recommendations for follow up therapy are one component of a multi-disciplinary discharge planning process, led by the attending physician.  Recommendations may be updated based on patient status, additional functional criteria and insurance authorization.  Follow Up Recommendations  Home health PT     Assistance Recommended at Discharge Intermittent Supervision/Assistance  Equipment Recommendations  Rolling walker (2 wheels);BSC/3in1    Recommendations for Other Services       Precautions / Restrictions Precautions Precautions: Fall Precaution Comments: COVID Restrictions LLE Weight Bearing: Weight bearing as tolerated Other Position/Activity Restrictions: Verbally verified with ortho as note reads "PWB as tolerated"     Mobility  Bed Mobility               General bed mobility comments: sitting EOB on arrival    Transfers Overall transfer level: Needs assistance Equipment used: Rolling walker (2 wheels) Transfers: Sit to/from Stand Sit to Stand: Min guard           General transfer comment: cues for hand placement. Slow to rise but no physical assist.    Ambulation/Gait Ambulation/Gait  assistance: Min guard;Supervision Gait Distance (Feet): 90 Feet Assistive device: Rolling walker (2 wheels) Gait Pattern/deviations: Step-through pattern;Decreased stance time - left;Decreased step length - right Gait velocity: decreased Gait velocity interpretation: <1.31 ft/sec, indicative of household ambulator   General Gait Details: very slow and guarded gait with decreased weight shift/WB through LLE, no evidence of imbalance. No SOB. VSS on RA.Ambulated to bathroom. Able to clean herself   Stairs             Wheelchair Mobility    Modified Rankin (Stroke Patients Only)       Balance Overall balance assessment: Needs assistance Sitting-balance support: Feet supported;No upper extremity supported Sitting balance-Leahy Scale: Good     Standing balance support: During functional activity;Reliant on assistive device for balance Standing balance-Leahy Scale: Poor Standing balance comment: Requires UE support in standing.                            Cognition Arousal/Alertness: Awake/alert Behavior During Therapy: WFL for tasks assessed/performed Overall Cognitive Status: Within Functional Limits for tasks assessed                                          Exercises General Exercises - Lower Extremity Ankle Circles/Pumps: AROM;Both;10 reps;Seated Long Arc Quad: AROM;10 reps;Seated;Both    General Comments        Pertinent Vitals/Pain Pain Assessment: Faces Faces Pain Scale: Hurts little more Pain Location: Lft  hip Pain Descriptors / Indicators: Tender;Aching;Discomfort;Sore Pain Intervention(s): Limited activity within patient's tolerance;Monitored during session;Repositioned;Patient requesting pain meds-RN notified    Home Living                          Prior Function            PT Goals (current goals can now be found in the care plan section) Acute Rehab PT Goals Patient Stated Goal: Pt's goal is to return  home Progress towards PT goals: Progressing toward goals    Frequency    Min 5X/week      PT Plan Current plan remains appropriate    Co-evaluation              AM-PAC PT "6 Clicks" Mobility   Outcome Measure  Help needed turning from your back to your side while in a flat bed without using bedrails?: A Little Help needed moving from lying on your back to sitting on the side of a flat bed without using bedrails?: A Little Help needed moving to and from a bed to a chair (including a wheelchair)?: A Little Help needed standing up from a chair using your arms (e.g., wheelchair or bedside chair)?: A Little Help needed to walk in hospital room?: A Little Help needed climbing 3-5 steps with a railing? : A Little 6 Click Score: 18    End of Session Equipment Utilized During Treatment: Gait belt Activity Tolerance: Patient tolerated treatment well Patient left: in chair;with call bell/phone within reach;with chair alarm set Nurse Communication: Mobility status PT Visit Diagnosis: Unsteadiness on feet (R26.81);History of falling (Z91.81);Other abnormalities of gait and mobility (R26.89);Pain Pain - Right/Left: Left Pain - part of body: Hip     Time: 1206-1225 PT Time Calculation (min) (ACUTE ONLY): 19 min  Charges:  $Gait Training: 8-22 mins                     Angelica Romero M,PT Acute Rehab Services [phone] (pager)    Alvira Philips 07/22/2021, 2:48 PM

## 2021-07-22 NOTE — Progress Notes (Signed)
PROGRESS NOTE  Angelica Romero XFG:[id number] DOB: 10-26-1940 DOA: 07/17/2021 PCP: [name], MD  Brief History   80 year old female with medical history of hypertension, hyperlipidemia, prediabetes, vitamin D deficiency, DJD, gout presented after mechanical fall.  Patient was getting up to feed her animals and tripped over a rug and fell onto her left hip and hit her head.  She was on the ground for 4 hours.  EMS was called. In the ED pelvic x-ray showed fracture of the left inferior pubic ramus, no rib fracture.  Orthopedics was consulted.  Also patient found to be positive for SARS COVID 2 RT-PCR.  The patient is saturating 97% on room air. She has been evaluated by PT and recommendation is for home with home health with PT/OT. The patient states that she lives alone, but that her daughter and a neighbor have said that they will come by to help.   Consult to [facility] in place.   Consultants  Orthopedic surgery  Procedures  None  Antibiotics   Anti-infectives (From admission, onward)    Start     Dose/Rate Route Frequency Ordered Stop   07/19/21 1000  remdesivir 100 mg in sodium chloride 0.9 % 100 mL IVPB  Status:  Discontinued       See Hyperspace for full Linked Orders Report.   100 mg 200 mL/hr over 30 Minutes Intravenous Daily 07/18/21 1334 07/18/21 1336   07/19/21 1000  remdesivir 100 mg in sodium chloride 0.9 % 100 mL IVPB       See Hyperspace for full Linked Orders Report.   100 mg 200 mL/hr over 30 Minutes Intravenous Daily 07/18/21 1338 07/22/21 1122   07/18/21 1430  remdesivir 200 mg in sodium chloride 0.9% 250 mL IVPB  Status:  Discontinued       See Hyperspace for full Linked Orders Report.   200 mg 580 mL/hr over 30 Minutes Intravenous Once 07/18/21 1334 07/18/21 1336   07/18/21 1430  remdesivir 200 mg in sodium chloride 0.9% 250 mL IVPB       See Hyperspace for full Linked Orders Report.   200 mg 580 mL/hr over 30 Minutes Intravenous Once 07/18/21 1338 07/18/21  1612      Subjective  The patient is sitting up in a chair at bedside. No new complaints.  Objective   Vitals:  Vitals:   07/22/21 0626 07/22/21 0808  BP: 123/77 (!) 148/89  Pulse: (!) 59 82  Resp: 18 16  Temp: (!) 97.3 F (36.3 C) 98.7 F (37.1 C)  SpO2: 95% 97%    Exam:  Constitutional:  The patient is awake, alert, and oriented x 3. No acute distress. Respiratory:  No increased work of breathing. No wheezes, rales, or rhonchi No tactile fremitus Cardiovascular:  Regular rate and rhythm No murmurs, ectopy, or gallups. No lateral PMI. No thrills. Abdomen:  Abdomen is soft, non-tender, non-distended No hernias, masses, or organomegaly Normoactive bowel sounds.  Musculoskeletal:  No cyanosis, clubbing, or edema Skin:  No rashes, lesions, ulcers palpation of skin: no induration or nodules Neurologic:  CN 2-12 intact Sensation all 4 extremities intact Psychiatric:  Mental status Mood, affect appropriate Orientation to person, place, time  judgment and insight appear intact   I have personally reviewed the following:   Today's Data  Vitals  Lab Data  CMP, CBC  Micro Data  + for COVID-19  Imaging  CXR  Cardiology Data  EKG  Scheduled Meds:  allopurinol  150 mg Oral Daily  aspirin EC  81 mg Oral Daily   enoxaparin (LOVENOX) injection  40 mg Subcutaneous QHS   feeding supplement  237 mL Oral BID BM   multivitamin with minerals  1 tablet Oral Daily   predniSONE  50 mg Oral Daily   sodium chloride flush  3 mL Intravenous Q12H   Continuous Infusions:  sodium chloride      Principal Problem:   Pubic ramus fracture (HCC) Active Problems:   Labile hypertension   Hyperlipidemia, mixed   Prediabetes   Neck pain   COVID-19 Positive   LOS: 5 days   A & P  Pubic ramus fracture -Orthopedics consulted -Okay for weightbearing as tolerated with physical therapy -Likely home with home PT versus skilled nursing facility -Patient refusing to  go to skilled nursing facility   Neck pain -Surgical CT was unremarkable for acute injury -Showed cervical spondylosis   Hypertension -Blood pressure is soft -Home medications on hold at this time   Leukocytosis -WBC elevated at 11.1 -Improving -Likely from steroids  COVID-19 Pneumonia - Day 5/5 of Remdesevir - Day 3/10 of Steroids.   Sinus tachycardia -EKG obtained this morning shows normal sinus rhythm -Patient asymptomatic   Gout -Continue allopurinol    I have seen and examined this patient myself. I have spent 32 minutes in her evaluation and care.  DVT prophylaxis: Lovenox Code Status: Full Code Family Communication: None available Disposition Plan: Home with home health PT.    Gurinder Toral, DO [facility] Direct contact: see [email or web address]  7PM-7AM contact night coverage as above 07/22/2021, 6:29 PM  LOS: 4 days

## 2021-07-22 NOTE — Progress Notes (Signed)
°   07/22/21 1030  Clinical Encounter Type  Visited With Patient (via phone)  Visit Type Initial;Social support  Referral From Chaplain [facility] Mariella Saa)  Consult/Referral To Chaplain   Chaplain Jorene Guest spoke with the patient via phone. The patient has a joyful presence and is delightful to talk with. Ike Bene listened empathically as she engaged in storytelling about her forty-year love, and he died after being married for seven weeks. He passed away eighteen months ago. Chaplain Baker Janus discerned his death appeared fresh to her as she described the loss as "leaving a hole in her heart." Ike Bene gained insight that her spirituality and church community brings her joy and seemingly her coping mechanism. The visit ended when the attending nurse came in. Advise the patient that the chaplain remains available for follow-up spiritual/emotional support.This note was prepared by Jeanine Luz, M.Div..  For questions please contact by phone [phone].

## 2021-07-22 NOTE — Plan of Care (Signed)

## 2021-07-23 LAB — CBC WITH DIFFERENTIAL/PLATELET
Abs Immature Granulocytes: 0.69 10*3/uL — ABNORMAL HIGH (ref 0.00–0.07)
Basophils Absolute: 0.1 10*3/uL (ref 0.0–0.1)
Basophils Relative: 0 %
Eosinophils Absolute: 0 10*3/uL (ref 0.0–0.5)
Eosinophils Relative: 0 %
HCT: 35.1 % — ABNORMAL LOW (ref 36.0–46.0)
Hemoglobin: 12.1 g/dL (ref 12.0–15.0)
Immature Granulocytes: 5 %
Lymphocytes Relative: 17 %
Lymphs Abs: 2.4 10*3/uL (ref 0.7–4.0)
MCH: 32.4 pg (ref 26.0–34.0)
MCHC: 34.5 g/dL (ref 30.0–36.0)
MCV: 94.1 fL (ref 80.0–100.0)
Monocytes Absolute: 0.9 10*3/uL (ref 0.1–1.0)
Monocytes Relative: 6 %
Neutro Abs: 10 10*3/uL — ABNORMAL HIGH (ref 1.7–7.7)
Neutrophils Relative %: 72 %
Platelets: 474 10*3/uL — ABNORMAL HIGH (ref 150–400)
RBC: 3.73 MIL/uL — ABNORMAL LOW (ref 3.87–5.11)
RDW: 14.6 % (ref 11.5–15.5)
WBC: 14 10*3/uL — ABNORMAL HIGH (ref 4.0–10.5)
nRBC: 0.1 % (ref 0.0–0.2)

## 2021-07-23 LAB — COMPREHENSIVE METABOLIC PANEL
ALT: 24 U/L (ref 0–44)
AST: 25 U/L (ref 15–41)
Albumin: 2.5 g/dL — ABNORMAL LOW (ref 3.5–5.0)
Alkaline Phosphatase: 77 U/L (ref 38–126)
Anion gap: 9 (ref 5–15)
BUN: 36 mg/dL — ABNORMAL HIGH (ref 8–23)
CO2: 21 mmol/L — ABNORMAL LOW (ref 22–32)
Calcium: 9.1 mg/dL (ref 8.9–10.3)
Chloride: 105 mmol/L (ref 98–111)
Creatinine, Ser: 0.91 mg/dL (ref 0.44–1.00)
GFR, Estimated: >60 mL/min (ref >60)
Glucose, Bld: 121 mg/dL — ABNORMAL HIGH (ref 70–99)
Potassium: 4.3 mmol/L (ref 3.5–5.1)
Sodium: 135 mmol/L (ref 135–145)
Total Bilirubin: 0.2 mg/dL — ABNORMAL LOW (ref 0.3–1.2)
Total Protein: 5.7 g/dL — ABNORMAL LOW (ref 6.5–8.1)

## 2021-07-23 LAB — C-REACTIVE PROTEIN: CRP: 2 mg/dL — ABNORMAL HIGH (ref <1.0)

## 2021-07-23 LAB — D-DIMER, QUANTITATIVE: D-Dimer, Quant: 2.42 ug/mL-FEU — ABNORMAL HIGH (ref 0.00–0.50)

## 2021-07-23 MED ORDER — ENSURE ENLIVE PO LIQD: 237.0000 mL | Freq: Two times a day (BID) | ORAL | 12 refills | Status: DC

## 2021-07-23 MED ORDER — ADULT MULTIVITAMIN W/MINERALS CH: 1.0000 | ORAL_TABLET | Freq: Every day | ORAL | 0 refills | Status: AC

## 2021-07-23 MED ORDER — PREDNISONE 50 MG PO TABS
50.0000 mg | ORAL_TABLET | Freq: Every day | ORAL | 0 refills | Status: AC
Start: 2021-07-24 — End: 2021-07-28

## 2021-07-23 NOTE — Progress Notes (Signed)
Physical Therapy Treatment Patient Details Name: Angelica Romero MRN: [id number] DOB: 11-13-1940 Today's Date: 07/23/2021   History of Present Illness Pt. is 80 yr old F admitted on 07/17/21 after fall resulting in inability to bear weight on L side. Imaging (+) for L inferior pubic ramus fx. Found to be COVID (+). PMH: HTN    PT Comments    Pt admitted with above diagnosis. Pt was able to ambulate and incr distance without LOB.  Progressing daily and waiting to be able to go home per pt.  Pt currently with functional limitations due to balance and  endurance deficits. Pt will benefit from skilled PT to increase their independence and safety with mobility to allow discharge to the venue listed below.      Recommendations for follow up therapy are one component of a multi-disciplinary discharge planning process, led by the attending physician.  Recommendations may be updated based on patient status, additional functional criteria and insurance authorization.  Follow Up Recommendations  Home health PT     Assistance Recommended at Discharge Intermittent Supervision/Assistance  Equipment Recommendations  Rolling walker (2 wheels);BSC/3in1    Recommendations for Other Services       Precautions / Restrictions Precautions Precautions: Fall Precaution Comments: COVID Restrictions LLE Weight Bearing: Weight bearing as tolerated     Mobility  Bed Mobility Overal bed mobility: Needs Assistance Bed Mobility: Supine to Sit     Supine to sit: Min guard;Supervision     General bed mobility comments: No asssit needed    Transfers Overall transfer level: Needs assistance Equipment used: Rolling walker (2 wheels) Transfers: Sit to/from Stand Sit to Stand: Min guard           General transfer comment: cues for hand placement. Slow to rise but no physical assist.    Ambulation/Gait Ambulation/Gait assistance: Min guard;Supervision Gait Distance (Feet): 250 Feet Assistive  device: Rolling walker (2 wheels) Gait Pattern/deviations: Step-through pattern;Decreased stance time - left;Decreased step length - right Gait velocity: decreased Gait velocity interpretation: <1.31 ft/sec, indicative of household ambulator   General Gait Details: very slow and guarded gait with decreased weight shift/WB through LLE, no evidence of imbalance. No SOB. VSS on RA.Ambulated to bathroom toward end of walk. Able to clean herself   Stairs             Wheelchair Mobility    Modified Rankin (Stroke Patients Only)       Balance Overall balance assessment: Needs assistance Sitting-balance support: Feet supported;No upper extremity supported Sitting balance-Leahy Scale: Good     Standing balance support: During functional activity;Reliant on assistive device for balance Standing balance-Leahy Scale: Poor Standing balance comment: Requires UE support in standing.                            Cognition Arousal/Alertness: Awake/alert Behavior During Therapy: WFL for tasks assessed/performed Overall Cognitive Status: Within Functional Limits for tasks assessed                                          Exercises General Exercises - Lower Extremity Ankle Circles/Pumps: AROM;Both;10 reps;Seated Long Arc Quad: AROM;10 reps;Seated;Both    General Comments        Pertinent Vitals/Pain Pain Assessment: Faces Faces Pain Scale: Hurts little more Pain Location: Lft hip Pain Descriptors / Indicators: Tender;Aching;Discomfort;Sore Pain Intervention(s): Limited  activity within patient's tolerance;Monitored during session;Repositioned    Home Living                          Prior Function            PT Goals (current goals can now be found in the care plan section) Acute Rehab PT Goals Patient Stated Goal: Pt's goal is to return home Progress towards PT goals: Progressing toward goals    Frequency    Min 5X/week       PT Plan Current plan remains appropriate    Co-evaluation              AM-PAC PT "6 Clicks" Mobility   Outcome Measure  Help needed turning from your back to your side while in a flat bed without using bedrails?: A Little Help needed moving from lying on your back to sitting on the side of a flat bed without using bedrails?: A Little Help needed moving to and from a bed to a chair (including a wheelchair)?: A Little Help needed standing up from a chair using your arms (e.g., wheelchair or bedside chair)?: A Little Help needed to walk in hospital room?: A Little Help needed climbing 3-5 steps with a railing? : A Little 6 Click Score: 18    End of Session Equipment Utilized During Treatment: Gait belt Activity Tolerance: Patient tolerated treatment well Patient left: in chair;with call bell/phone within reach;with chair alarm set Nurse Communication: Mobility status PT Visit Diagnosis: Unsteadiness on feet (R26.81);History of falling (Z91.81);Other abnormalities of gait and mobility (R26.89);Pain Pain - Right/Left: Left Pain - part of body: Hip     Time: 0569-7948 PT Time Calculation (min) (ACUTE ONLY): 28 min  Charges:  $Gait Training: 23-37 mins                     Ori Kreiter M,PT Acute Rehab Services [phone] (pager)    Alvira Philips 07/23/2021, 11:28 AM

## 2021-07-23 NOTE — Discharge Summary (Signed)
Physician Discharge Summary  Angelica Romero:[id number] DOB: 05-Apr-1941 DOA: 07/17/2021  PCP: [name], MD  Admit date: 07/17/2021 Discharge date: 07/23/2021  Recommendations for Outpatient Follow-up:  Discharge to home with home health PT/OT Follow up with PCP in 7-10 days. Chemistry should be checked at that visit and reported to PCP. 4. Follow up with orthopedic surgery in 3-4 weeks. 5. Weight bearing as tolerated.  Discharge Diagnoses: Principal diagnosis is #1 Pubic rami fracture  COVID pneumonia - Dx 07/17/2021 Course of Remdesevir has been completed. Hypertension Leukocytosis  Discharge Condition: Fair  Disposition: Home with home health  Diet recommendation: Heart healthy  Filed Weights   07/17/21 1545  Weight: 72.6 kg    History of present illness: Angelica Romero is a 80 y.o. female with medical history significant of HTN, HLD, prediabetes, vitamin D deficiency, DJD, gout who presented after mechanical fall yesterday. She was getting up to feed her animals around 8:25Am and she tripped over a rug and fell onto her left hip and hit her head. She was on the ground for 4 hours. She couldn't stand up or crawl. She was able to get her to the front door to let her daughter in.  They were able to sit her up in the chair. She peed and pooped on herself so didn't come to the hospital until today. She didn't want to come in a "filthy mess." Her daughter called EMS when she was still unable to bear weight this AM.   Pain is a 10/10 and described as terrible. Pain is located in her pelvis area.  No radiation. No loss of sensation. She denies any syncope.    No fever/chills, no dizziness/headaches, chest pain/palpitations, shortness of breath, (has a chronic cough since spring), abdominal pain, N/V, had some diarrhea yesterday, no leg swelling, dysuria.    ED Course: vitals: afebrile, bp: 118/65, HR: 91, RR: 14, oxygen: 98% room air Pertinent labs: pending Hip/pelvis xray:  fracture of left inferior pubic ramus, no hip fracture. In Ed given fentanyl, ortho consulted and [facility] was asked to admit.   Hospital Course:  80 year old female with medical history of hypertension, hyperlipidemia, prediabetes, vitamin D deficiency, DJD, gout presented after mechanical fall.  Patient was getting up to feed her animals and tripped over a rug and fell onto her left hip and hit her head.  She was on the ground for 4 hours.  EMS was called. In the ED pelvic x-ray showed fracture of the left inferior pubic ramus, no rib fracture.  Orthopedics was consulted.  Also patient found to be positive for SARS COVID 2 RT-PCR.   The patient is saturating 97% on room air. She has been evaluated by PT and recommendation is for home with home health with PT/OT. The patient states that she lives alone, but that her daughter and a neighbor have said that they will come by to help.    The patient has completed her course of remdesivir. She will be discharged to home with home health and the help of her family today.  Today's assessment: S: This patient is resting comfortably. No new complaints. O: Vitals:  Vitals:   07/23/21 0537 07/23/21 0803  BP: (!) 157/75 137/74  Pulse: 64 71  Resp: 15 16  Temp: (!) 97.5 F (36.4 C) 97.7 F (36.5 C)  SpO2: 98% 96%    Exam:   Constitutional:  The patient is awake, alert, and oriented x 3. No acute distress. Respiratory:  No increased work  of breathing. No wheezes, rales, or rhonchi No tactile fremitus Cardiovascular:  Regular rate and rhythm No murmurs, ectopy, or gallups. No lateral PMI. No thrills. Abdomen:  Abdomen is soft, non-tender, non-distended No hernias, masses, or organomegaly Normoactive bowel sounds.  Musculoskeletal:  No cyanosis, clubbing, or edema Skin:  No rashes, lesions, ulcers palpation of skin: no induration or nodules Neurologic:  CN 2-12 intact Sensation all 4 extremities intact Psychiatric:  Mental status Mood,  affect appropriate Orientation to person, place, time  judgment and insight appear intact     Discharge Instructions  Discharge Instructions     Activity as tolerated - No restrictions   Complete by: As directed    Call MD for:  persistant nausea and vomiting   Complete by: As directed    Call MD for:  severe uncontrolled pain   Complete by: As directed    Diet - low sodium heart healthy   Complete by: As directed    Increase activity slowly   Complete by: As directed       Allergies as of 07/23/2021       Reactions   Codeine Nausea And Vomiting   Hydrocodone Nausea And Vomiting   Lisinopril Cough   cough   Omeprazole Nausea And Vomiting   Epinephrine Palpitations   Other reaction(s): Irregular heart rate        Medication List     STOP taking these medications    topiramate 50 MG tablet Commonly known as: Topamax       TAKE these medications    allopurinol 300 MG tablet Commonly known as: ZYLOPRIM TAKE 1 TABLET BY MOUTH ONCE DAILY TO  PREVENT  GOUT What changed:  how much to take how to take this when to take this reasons to take this additional instructions   amitriptyline 10 MG tablet Commonly known as: ELAVIL Take 10-20 mg by mouth at bedtime as needed for sleep.   aspirin EC 81 MG tablet Take 81 mg by mouth daily. Reported on 07/31/2015   colchicine 0.6 MG tablet TAKE 1 TABLET BY MOUTH ONCE DAILY AS NEEDED(STARTING WITH ONSET OF GOUT FLARE)   feeding supplement Liqd Take 237 mLs by mouth 2 (two) times daily between meals.   multivitamin with minerals Tabs tablet Take 1 tablet by mouth daily. Start taking on: July 24, 2021   predniSONE 50 MG tablet Commonly known as: DELTASONE Take 1 tablet (50 mg total) by mouth daily for 4 days. Start taking on: July 24, 2021   telmisartan 80 MG tablet Commonly known as: MICARDIS Take  1 tablet  Daily  for BP / Patient knows to take by mouth  !   - Thanks What changed:  how much to  take how to take this when to take this additional instructions   VITAMIN C GUMMIE PO Take 1 tablet by mouth daily.   VITAMIN D PO Take 10,000 Units by mouth daily.       Allergies  Allergen Reactions   Codeine Nausea And Vomiting   Hydrocodone Nausea And Vomiting   Lisinopril Cough    cough   Omeprazole Nausea And Vomiting   Epinephrine Palpitations    Other reaction(s): Irregular heart rate    The results of significant diagnostics from this hospitalization (including imaging, microbiology, ancillary and laboratory) are listed below for reference.    Significant Diagnostic Studies: CT HEAD WO CONTRAST (5MM)  Result Date: 07/17/2021 CLINICAL DATA:  Head trauma, moderate-severe.  Fall. EXAM: CT HEAD WITHOUT  CONTRAST TECHNIQUE: Contiguous axial images were obtained from the base of the skull through the vertex without intravenous contrast. COMPARISON:  None. FINDINGS: Brain: There is atrophy and chronic small vessel disease changes. No acute intracranial abnormality. Specifically, no hemorrhage, hydrocephalus, mass lesion, acute infarction, or significant intracranial injury. Vascular: No hyperdense vessel or unexpected calcification. Skull: No acute calvarial abnormality. Sinuses/Orbits: Complete opacification of the left sphenoid sinus. Mucosal thickening in the posterior ethmoid air cells. Mucosal thickening and air-fluid level in the left maxillary sinus. Other: None IMPRESSION: Atrophy, chronic microvascular disease. No acute intracranial abnormality. Acute on chronic left sinusitis. Electronically Signed   By: Rolm Baptise M.D.   On: 07/17/2021 18:40   CT CERVICAL SPINE WO CONTRAST  Result Date: 07/17/2021 CLINICAL DATA:  Fall.  Neck trauma (Age >= 65y) EXAM: CT CERVICAL SPINE WITHOUT CONTRAST TECHNIQUE: Multidetector CT imaging of the cervical spine was performed without intravenous contrast. Multiplanar CT image reconstructions were also generated. COMPARISON:  None.  FINDINGS: Alignment: Normal Skull base and vertebrae: No acute fracture. No primary bone lesion or focal pathologic process. Soft tissues and spinal canal: No prevertebral fluid or swelling. No visible canal hematoma. Disc levels: Moderate diffuse degenerative disc disease and facet disease, left greater than right. Upper chest: No acute findings Other: None IMPRESSION: Cervical spondylosis.  No acute bony abnormality. Electronically Signed   By: Rolm Baptise M.D.   On: 07/17/2021 18:41   DG Chest Port 1V same Day  Result Date: 07/18/2021 CLINICAL DATA:  COVID 19. EXAM: PORTABLE CHEST 1 VIEW COMPARISON:  07/21/2006 FINDINGS: Mild bibasilar airspace disease has developed since the prior study. Possible pneumonia. Heart size and vascularity normal.  No pleural effusion. IMPRESSION: Bibasilar airspace disease right greater than left compatible with pneumonia. Electronically Signed   By: Franchot Gallo M.D.   On: 07/18/2021 14:36   DG HIP UNILAT WITH PELVIS 2-3 VIEWS LEFT  Result Date: 07/17/2021 CLINICAL DATA:  Fall.  Left hip pain EXAM: DG HIP (WITH OR WITHOUT PELVIS) 2-3V LEFT COMPARISON:  07/21/2006 FINDINGS: Negative for fracture of the left hip.  Normal hip joint space. There is a fracture of the left inferior pubic ramus which could be acute. This was not seen previously. No other pelvic fracture. IMPRESSION: Fracture left inferior pubic ramus which may be acute. No hip fracture. Electronically Signed   By: Franchot Gallo M.D.   On: 07/17/2021 16:46    Microbiology: Recent Results (from the past 240 hour(s))  Resp Panel by RT-PCR (Flu A&B, Covid) Nasopharyngeal Swab     Status: Abnormal   Collection Time: 07/17/21  4:51 PM   Specimen: Nasopharyngeal Swab; Nasopharyngeal(NP) swabs in vial transport medium  Result Value Ref Range Status   SARS Coronavirus 2 by RT PCR POSITIVE (A) NEGATIVE Final    Comment: RESULT CALLED TO, READ BACK BY AND VERIFIED WITH: RN Jennefer Bravo [id number]  MLM (NOTE) SARS-CoV-2 target nucleic acids are DETECTED.  The SARS-CoV-2 RNA is generally detectable in upper respiratory specimens during the acute phase of infection. Positive results are indicative of the presence of the identified virus, but do not rule out bacterial infection or co-infection with other pathogens not detected by the test. Clinical correlation with patient history and other diagnostic information is necessary to determine patient infection status. The expected result is Negative.  Fact Sheet for Patients: EntrepreneurPulse.com.au  Fact Sheet for Healthcare Providers: IncredibleEmployment.be  This test is not yet approved or cleared by the [place] FDA and  has been  authorized for detection and/or diagnosis of SARS-CoV-2 by FDA under an Emergency Use Authorization (EUA).  This EUA will remain in effect (meaning this test can be used)  for the duration of  the COVID-19 declaration under Section 564(b)(1) of the Act, 21 U.S.C. section 360bbb-3(b)(1), unless the authorization is terminated or revoked sooner.     Influenza A by PCR NEGATIVE NEGATIVE Final   Influenza B by PCR NEGATIVE NEGATIVE Final    Comment: (NOTE) The Xpert Xpress SARS-CoV-2/FLU/RSV plus assay is intended as an aid in the diagnosis of influenza from Nasopharyngeal swab specimens and should not be used as a sole basis for treatment. Nasal washings and aspirates are unacceptable for Xpert Xpress SARS-CoV-2/FLU/RSV testing.  Fact Sheet for Patients: EntrepreneurPulse.com.au  Fact Sheet for Healthcare Providers: IncredibleEmployment.be  This test is not yet approved or cleared by the [place] FDA and has been authorized for detection and/or diagnosis of SARS-CoV-2 by FDA under an Emergency Use Authorization (EUA). This EUA will remain in effect (meaning this test can be used) for the duration of the COVID-19  declaration under Section 564(b)(1) of the Act, 21 U.S.C. section 360bbb-3(b)(1), unless the authorization is terminated or revoked.  Performed at [facility], [place] [address], [place], [place] [address]      Labs: Basic Metabolic Panel: Recent Labs  Lab 07/19/21 0202 07/20/21 0320 07/21/21 0350 07/22/21 0147 07/23/21 0117  NA 135 136 136 135 135  K 4.1 4.2 4.8 4.8 4.3  CL 105 106 105 105 105  CO2 20* 23 23 24  21*  GLUCOSE 168* 156* 179* 163* 121*  BUN 23 29* 34* 33* 36*  CREATININE 0.88 0.86 0.83 0.90 0.91  CALCIUM 9.3 9.0 9.3 9.2 9.1   Liver Function Tests: Recent Labs  Lab 07/19/21 0202 07/20/21 0320 07/21/21 0350 07/22/21 0147 07/23/21 0117  AST 25 19 23 25 25   ALT 20 17 21 22 24   ALKPHOS 81 67 69 75 77  BILITOT 0.4 0.3 0.3 0.3 0.2*  PROT 6.5 5.9* 5.9* 5.8* 5.7*  ALBUMIN 2.7* 2.4* 2.4* 2.5* 2.5*   No results for input(s): LIPASE, AMYLASE in the last 168 hours. No results for input(s): AMMONIA in the last 168 hours. CBC: Recent Labs  Lab 07/19/21 0202 07/20/21 0320 07/21/21 0350 07/22/21 0147 07/23/21 0117  WBC 8.1 13.6* 11.1* 12.5* 14.0*  NEUTROABS 7.4 11.2* 9.7* 10.5* 10.0*  HGB 12.0 11.5* 11.6* 12.1 12.1  HCT 34.8* 33.4* 34.1* 35.3* 35.1*  MCV 93.8 94.4 95.5 94.9 94.1  PLT 348 395 447* 481* 474*   Cardiac Enzymes: Recent Labs  Lab 07/17/21 1732  CKTOTAL 109   BNP: BNP (last 3 results) No results for input(s): BNP in the last 8760 hours.  ProBNP (last 3 results) No results for input(s): PROBNP in the last 8760 hours.  CBG: No results for input(s): GLUCAP in the last 168 hours.  Principal Problem:   Pubic ramus fracture (HCC) Active Problems:   Labile hypertension   Hyperlipidemia, mixed   Prediabetes   Neck pain   Time coordinating discharge: 38 minutes.  Signed:        Ivyonna Hoelzel, DO [facility]  07/23/2021, 5:13 PM

## 2021-07-23 NOTE — Plan of Care (Signed)

## 2021-09-16 ENCOUNTER — Ambulatory Visit: Payer: Medicare Other | Admitting: Adult Health Nurse Practitioner

## 2021-09-16 NOTE — Progress Notes (Signed)
MEDICARE ANNUAL WELLNESS VISIT AND FOLLOW UP  Assessment:   Annual Medicare Wellness Visit Due annually  Health maintenance reviewed  Labile hypertension - continue medications, DASH diet, exercise and monitor at home. Call if greater than 130/80.  -     CBC with Differential/Platelet -     CMP/GFR -     TSH  Other abnormal glucose (Hx of prediabetes) Discussed disease and risks Discussed diet/exercise, weight management  Check A1C q44m; CMP for glucose otherwise  Hyperlipademia -continue medications, check lipids, decrease fatty foods, increase activity.  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, had SE with 300 mg, uses colchicine PRN flares Discussed diet/lifestyle; discussed colchicine to hold and start if future gout flares  Osteopenia - get dexa in 2 years, continue Vit D and Ca, weight bearing exercises  Medication management Continued  Closed fracture of ramus of left pubis ([place]) Patient reports managing at home, declines referral back to ortho  Discussed follow up xray at 12 weeks, order placed  If good healing an discuss PT if needed, patient is optimistic about gradually increasing exercises herself, declines at this time  Systolic murmur 3/6 systolic, patient denies sx, persistent since hospitalization per patient, has not had ECHO, patient is interested in pursuing due to persistence; cardiology referral placed.  Orders Placed This Encounter  Procedures   DG HIP UNILAT W OR W/O PELVIS 2-3 VIEWS LEFT   CBC with Differential/Platelet   COMPLETE METABOLIC PANEL WITH GFR   Magnesium   Lipid panel   TSH   Hemoglobin A1c   Ambulatory referral to Cardiology     Over 30 minutes of  face to face interview, exam, counseling, chart review, and critical decision making was performed  Future Appointments  Date Time Provider [place]  01/20/2022  2:00 PM [name], MD [facility] None  04/26/2023 11:00 AM [name], NP [facility] None    Plan:   During the course of the visit the patient was educated and counseled about appropriate screening and preventive services including:   Pneumococcal vaccine  Influenza vaccine Td vaccine Prevnar 13 Screening electrocardiogram Screening mammography Bone densitometry screening Colorectal cancer screening Diabetes screening Glaucoma screening Nutrition counseling  Advanced directives: given info/requested copies   Subjective:   Angelica Romero is a 81 y.o. female who presents for Medicare Annual Wellness Visit and 3 month follow up on hypertension, prediabetes, hyperlipidemia, vitamin D def and morbid obesity.  Husband passed away 2019/11/22. She did with therapist at [facility]. She feels she is doing fairly well. Living by herself now, has cats and dogs to keep her company. Family is close by and checks on her regularly.  She had mechanical fall (tripped on cat) in December 2022 and was admitted for left rami fracture, Dr. Marlou Sa recommended conservative therapy, PT and vitamin D. She was also found to be covid 19 positive with pneumonia and underwent remdesivir therapy. She was discharged home on 07/23/2021 with plan for PT/OT, follow up with orthopedic surgery in 3-4 weeks. She reports never had PT, hasn't heard from ortho and never saw. Doesn't want to see them, has been doing gradual exercises at home, declines referral back to ortho or for PT, plans to start at  home after 12 weeks. We discussed follow up xray which she is agreeable to schedule. She is using a cane for stability as she recovers. Doing some chair exercises for RLE.   She follows with Dr. Tyson Dense for injections, chronic lumbar back pain.   She does  have hx of osteopenia, last 06/02/2021 with left fem neck T -1.4.   BMI is Body mass index is 32.42 kg/m., she has been working on diet, stops eating after sundown, watching portions. Active around her yard but not intentionally active.  Wt Readings from Last 3 Encounters:  09/17/21 166 lb (75.3 kg)  07/17/21 160 lb (72.6 kg)  02/24/21 170 lb (77.1 kg)   Her blood pressure has been controlled at home, today their BP is BP: 138/70 She does workout. She denies chest pain, shortness of breath, dizziness.   She is not on cholesterol medication and denies myalgias. Her cholesterol is at goal. The cholesterol last visit was:   Lab Results  Component Value Date   CHOL 152 01/20/2021   HDL 62 01/20/2021   LDLCALC 77 01/20/2021   TRIG 58 01/20/2021   CHOLHDL 2.5 01/20/2021   She has hx of prediabetes improved to normal at last check. She reports that she has been working on diet and exercise.  Lab Results  Component Value Date   HGBA1C 5.6 01/20/2021   Last GFR Lab Results  Component Value Date   GFRNONAA >60 07/23/2021   Patient is on Vitamin D supplement, takes 10000 IU but not daily  Lab Results  Component Value Date   VD25OH 105.61 (H) 07/18/2021     Patient is on allopurinol (takes 1/2 tab only due to interance, takes colchicine if needed.) for gout and does  not report a recent flare.  Lab Results  Component Value Date   LABURIC 3.9 01/20/2021      Medication Review Current Outpatient Medications on File Prior to Visit  Medication Sig Dispense Refill   allopurinol (ZYLOPRIM) 300 MG tablet TAKE 1 TABLET BY MOUTH ONCE DAILY TO  PREVENT  GOUT (Patient taking differently: Take 150 mg by mouth daily as needed (gout).) 90 tablet 3   Ascorbic Acid (VITAMIN C GUMMIE PO) Take 1 tablet by mouth daily.     aspirin EC 81 MG tablet Take 81 mg by mouth daily. Reported on 07/31/2015     Cholecalciferol (VITAMIN D PO) Take 5,000 Units by mouth daily.     colchicine 0.6 MG tablet TAKE  1 TABLET BY MOUTH ONCE DAILY AS NEEDED(STARTING WITH ONSET OF GOUT FLARE) 30 tablet 1   feeding supplement (ENSURE ENLIVE / ENSURE PLUS) LIQD Take 237 mLs by mouth 2 (two) times daily between meals. 237 mL 12   telmisartan (MICARDIS) 80 MG tablet Take  1 tablet  Daily  for BP / Patient knows to take by mouth  !   - Thanks (Patient taking differently: Take 80 mg by mouth daily.) 90 tablet 3   Multiple Vitamin (MULTIVITAMIN WITH MINERALS) TABS tablet Take 1 tablet by mouth daily. (Patient not taking: Reported on 09/17/2021) 30 tablet 0   No current facility-administered medications on file prior to visit.    Allergies: Allergies  Allergen Reactions   Codeine Nausea And Vomiting   Hydrocodone Nausea And Vomiting   Lisinopril Cough    cough   Omeprazole Nausea And Vomiting   Epinephrine Palpitations    Other reaction(s): Irregular heart rate    Current Problems (verified) has Labile hypertension; Vitamin D deficiency;  Hyperlipidemia, mixed; DJD (degenerative joint disease), multiple sites; Hyperuricemia; Abnormal glucose (hx of prediabetes); Obesity (BMI 30.0-34.9); Osteopenia; Pubic ramus fracture ([place]); and Systolic murmur on their problem list.  Screening Tests Immunization History  Administered Date(s) Administered   DT (Pediatric) 07/31/2015   DTaP 06/09/2005   Fluad Quad(high Dose 65+) 05/11/2019, 04/21/2021   Influenza, High Dose Seasonal PF 04/13/2017, 06/10/2020   Influenza-Unspecified 04/09/2014, 05/10/2015, 04/22/2016   Pneumococcal Conjugate-13 07/01/2014   Pneumococcal-Unspecified 06/10/1999, 06/09/2008   Zoster, Live 06/10/2007   Health Maintenance  Topic Date Due   COVID-19 Vaccine (1) 10/03/2021 (Originally 05/16/1941)   Zoster Vaccines- Shingrix (1 of 2) 12/15/2021 (Originally 11/13/1959)   MAMMOGRAM  06/03/2023   DEXA SCAN  06/03/2023   TETANUS/TDAP  07/30/2025   INFLUENZA VACCINE  Completed   HPV VACCINES  Aged Out   Pneumonia Vaccine 1+ Years old  Discontinued     Last colonoscopy: cologuard - 10/2018 negative - DONE Last mammogram: annually at breast center DEXA: 06/02/2021 osteopenia - T -1.4  Names of Other Physician/Practitioners you currently use: 1. [facility]- here for primary care 2. Dr. Santo Held, eye doctor, last visit 2022 3. Dr. Orene Desanctis, dentist, last visit 2021,  q 6 months, going today   Patient Care Team: [name], MD as PCP - General (Internal Medicine) Monna Fam, MD as Consulting Physician (Ophthalmology) Druscilla Brownie, MD as Consulting Physician (Dermatology)  Surgical: She  has no past surgical history on file. Family Her family history includes Breast cancer in her mother. Social history  She reports that she has never smoked. She has never used smokeless tobacco. She reports that she does not drink alcohol. No history on file for drug use.  MEDICARE WELLNESS OBJECTIVES: Physical activity: Current Exercise Habits: Home exercise routine, Type of exercise: strength training/weights;stretching, Time (Minutes): 10, Frequency (Times/Week): 7, Weekly Exercise (Minutes/Week): 70, Intensity: Mild, Exercise limited by: orthopedic condition(s) Cardiac risk factors: Cardiac Risk Factors include: advanced age (>75men, >68 women);dyslipidemia;hypertension;obesity (BMI >30kg/m2) Depression/mood screen:   Depression screen [facility] 2/9 09/17/2021  Decreased Interest 0  Down, Depressed, Hopeless 0  PHQ - 2 Score 0    ADLs:  In your present state of health, do you have any difficulty performing the following activities: 09/17/2021 07/17/2021  Hearing? N N  Vision? N N  Difficulty concentrating or making decisions? N Y  Walking or climbing stairs? N N  Dressing or bathing? N N  Doing errands, shopping? N N  Some recent data might be hidden     Cognitive Testing  Alert? Yes  Normal Appearance?Yes  Oriented to person? Yes  Place? Yes   Time? Yes  Recall of three objects?   Yes  Can perform simple calculations? Yes  Displays appropriate judgment?Yes  Can read the correct time from a watch face?Yes  EOL planning: Does Patient Have a Medical Advance Directive?: Yes Type of Advance Directive: Healthcare Power of Attorney, Living will Does patient want to make changes to medical advance directive?: No - Patient declined Copy of [place] in Chart?: Yes - validated most recent copy scanned in chart (See row information)   Objective:   Today's Vitals   09/17/21 0942  BP: 138/70  Pulse: 71  Temp: (!) 97.3 F (36.3 C)  SpO2: 99%  Weight: 166 lb (75.3 kg)    Body mass index is 32.42 kg/m.  General appearance: alert, no distress, WD/WN,  female HEENT: normocephalic, sclerae anicteric, TMs pearly, nares patent, no discharge or erythema,  pharynx normal Oral cavity: MMM, no lesions Neck: supple, no lymphadenopathy, no thyromegaly, no masses Heart: RRR, normal S1, S2, 3/6 decreshendo systolic murmur R 2nd ICS Lungs: CTA bilaterally, no wheezes, rhonchi, or rales Abdomen: +bs, soft, non tender, non distended, no masses, no hepatomegaly, no splenomegaly Musculoskeletal: Limited exam due to fracture, slow steady gait Extremities: no edema, no cyanosis, no clubbing Pulses: 2+ symmetric, upper and lower extremities, normal cap refill Neurological: alert, oriented x 3, CN2-12 intact, strength normal upper extremities and lower extremities, sensation normal throughout, DTRs 2+ throughout, no cerebellar signs, gait slow steady  Psychiatric: normal affect, behavior normal, pleasant   Medicare Attestation I have personally reviewed: The patient's medical and social history Their use of alcohol, tobacco or illicit drugs Their current medications and supplements The patient's functional ability including ADLs,fall risks, home safety risks, cognitive, and hearing and visual impairment Diet and physical activities Evidence for depression or mood  disorders  The patient's weight, height, BMI, and visual acuity have been recorded in the chart.  I have made referrals, counseling, and provided education to the patient based on review of the above and I have provided the patient with a written personalized care plan for preventive services.     Izora Ribas, NP   09/17/2021

## 2021-09-17 ENCOUNTER — Other Ambulatory Visit: Payer: Self-pay

## 2021-09-17 ENCOUNTER — Ambulatory Visit (INDEPENDENT_AMBULATORY_CARE_PROVIDER_SITE_OTHER): Payer: Medicare Other | Admitting: Adult Health

## 2021-09-17 ENCOUNTER — Encounter: Payer: Self-pay | Admitting: Adult Health

## 2021-09-17 VITALS — BP 138/70 | HR 71 | Temp 97.3°F | Wt 166.0 lb

## 2021-09-17 DIAGNOSIS — E782 Mixed hyperlipidemia: Secondary | ICD-10-CM

## 2021-09-17 DIAGNOSIS — M8580 Other specified disorders of bone density and structure, unspecified site: Secondary | ICD-10-CM | POA: Diagnosis not present

## 2021-09-17 DIAGNOSIS — S32592A Other specified fracture of left pubis, initial encounter for closed fracture: Secondary | ICD-10-CM

## 2021-09-17 DIAGNOSIS — E669 Obesity, unspecified: Secondary | ICD-10-CM

## 2021-09-17 DIAGNOSIS — E559 Vitamin D deficiency, unspecified: Secondary | ICD-10-CM

## 2021-09-17 DIAGNOSIS — R6889 Other general symptoms and signs: Secondary | ICD-10-CM

## 2021-09-17 DIAGNOSIS — R0989 Other specified symptoms and signs involving the circulatory and respiratory systems: Secondary | ICD-10-CM

## 2021-09-17 DIAGNOSIS — Z0001 Encounter for general adult medical examination with abnormal findings: Secondary | ICD-10-CM

## 2021-09-17 DIAGNOSIS — E790 Hyperuricemia without signs of inflammatory arthritis and tophaceous disease: Secondary | ICD-10-CM

## 2021-09-17 DIAGNOSIS — R011 Cardiac murmur, unspecified: Secondary | ICD-10-CM | POA: Diagnosis not present

## 2021-09-17 DIAGNOSIS — M159 Polyosteoarthritis, unspecified: Secondary | ICD-10-CM

## 2021-09-17 DIAGNOSIS — R7309 Other abnormal glucose: Secondary | ICD-10-CM | POA: Diagnosis not present

## 2021-09-17 DIAGNOSIS — Z0000 Encounter for general adult medical examination without abnormal findings: Secondary | ICD-10-CM

## 2021-09-17 MED ORDER — AMITRIPTYLINE HCL 10 MG PO TABS: 10.0000 mg | ORAL_TABLET | Freq: Every evening | ORAL | 0 refills | Status: DC | PRN

## 2021-09-17 NOTE — Patient Instructions (Addendum)
Angelica Romero , Thank you for taking time to come for your Medicare Wellness Visit. I appreciate your ongoing commitment to your health goals. Please review the following plan we discussed and let me know if I can assist you in the future.   These are the goals we discussed:  Goals      Blood Pressure < 130/80     Weight (lb) < 180 lb (81.6 kg)        This is a list of the screening recommended for you and due dates:  Health Maintenance  Topic Date Due   COVID-19 Vaccine (1) Never done   Zoster (Shingles) Vaccine (1 of 2) Never done   Pneumonia Vaccine (2 - PPSV23 if available, else PCV20) 07/02/2015   Flu Shot  03/09/2021   Mammogram  06/03/2023   DEXA scan (bone density measurement)  06/03/2023   Tetanus Vaccine  07/30/2025   HPV Vaccine  Aged Out    Please go get your xray on 09/25/2021 or the following week   To get your xray:  Please get your chest xray at [address] [facility], can walk in without an appointment M-F, 8-4pm    Preventing Osteoporosis, Adult Osteoporosis is a condition that causes the bones to lose density. This means that the bones become thinner, and the normal spaces in bone tissue become larger. Low bone density can make the bones weak and cause them to break more easily. Osteoporosis cannot always be prevented, but you can take steps to lower your risk of developing this condition. How can this condition affect me? If you develop osteoporosis, you will be more likely to break bones in your wrist, spine, or hip. Even a minor accident or injury can be enough to break weak bones. The bones will also be slower to heal. Osteoporosis can cause other problems as well, such as a stooped posture or trouble with movement. Osteoporosis can occur with aging. As you get older, you may lose bone tissue more quickly, or it may be replaced more slowly. Osteoporosis is more likely to develop if you have poor nutrition or do not get enough calcium or vitamin D.  Other lifestyle factors can also play a role. By eating a well-balanced diet and making lifestyle changes, you can help keep your bones strong and healthy, lowering your chances of developing osteoporosis. What can increase my risk? The following factors may make you more likely to develop osteoporosis: Having a family history of the condition. Having poor nutrition or not getting enough calcium or vitamin D. Using certain medicines, such as steroid medicines or anti-seizure medicines. Being any of the following: 40 years of age or older. Female. A woman who has gone through menopause (is postmenopausal). A person who is of European or Asian descent. Using products that contain nicotine or tobacco, such as cigarettes, e-cigarettes, and chewing tobacco. Not being physically active (being sedentary). Having a small body frame. What actions can I take to prevent this? Get enough calcium  Make sure you get enough calcium every day. Calcium is the most important mineral for bone health. Most people can get enough calcium from their diet, but supplements may be recommended for people who are at risk for osteoporosis. Follow these guidelines: If you are age 27 or younger, aim to get 1,000 milligrams (mg) of calcium every day. If you are older than age 56, aim to get 1,200 mg of calcium every day. Good sources of calcium include: Dairy products, such  as low-fat or nonfat milk, cheese, and yogurt. Dark green leafy vegetables, such as bok choy and broccoli. Foods that have had calcium added to them (calcium-fortified foods), such as orange juice, cereal, bread, soy beverages, and tofu products. Nuts, such as almonds. Check nutrition labels to see how much calcium is in a food or drink. Get enough vitamin D Try to get enough vitamin D every day. Vitamin D is the most essential vitamin for bone health. It helps the body absorb calcium. Follow these guidelines for how much vitamin D to get from  food: If you are age 81 or younger, aim to get at least 600 international units (IU) every day. Your health care provider may suggest more. If you are older than age 70, aim to get at least 800 international units every day. Your health care provider may suggest more. Good sources of vitamin D in your diet include: Egg yolks. Oily fish, such as salmon, sardines, and tuna. Milk and cereal fortified with vitamin D. Your body also makes vitamin D when you are out in the sun. Exposing the bare skin on your face, arms, legs, or back to the sun for no more than 30 minutes a day, 2 times a week is more than enough. Beyond that, make sure you use sunblock to protect your skin from sunburn, which increases your risk for skin cancer. Exercise  Stay active and get exercise every day. Ask your health care provider what types of exercise are best for you. Weight-bearing and strength-building activities are important for building and maintaining healthy bones. Some examples of these types of activities include: Walking and hiking. Jogging and running. Dancing. Gym exercises and lifting weights. Tennis and racquetball. Climbing stairs. Tai chi. Make other lifestyle changes Do not use any products that contain nicotine or tobacco, such as cigarettes, e-cigarettes, and chewing tobacco. If you need help quitting, ask your health care provider. Lose weight if you are overweight. If you drink alcohol: Limit how much you use to: 0-1 drink a day for women who are not pregnant. 0-2 drinks a day for men. Be aware of how much alcohol is in your drink. In the [place] one drink equals one 12 oz bottle of beer (355 mL), one 5 oz glass of wine (148 mL), or one 1 oz glass of hard liquor (44 mL). Where to find support If you need help making changes to prevent osteoporosis, talk with your health care provider. You can ask for a referral to a dietitian and a physical therapist. Where to find more information Learn more  about osteoporosis from: NIH Osteoporosis and Related [place]: www.bones.SouthExposed.es [place] Office on [employer] Health: [place]s.tn [place]: EquipmentWeekly.com.ee Summary Osteoporosis is a condition that causes weak bones that are more likely to break. Eat a healthy diet, making sure you get enough calcium and vitamin D, and stay active by getting regular exercise to help prevent osteoporosis. Other ways to reduce your risk of osteoporosis include maintaining a healthy weight and avoiding alcohol and products that contain nicotine or tobacco. This information is not intended to replace advice given to you by your health care provider. Make sure you discuss any questions you have with your health care provider. Document Revised: 01/10/2020 Document Reviewed: 01/10/2020 Elsevier Patient Education  Angelica Romero.

## 2021-09-18 LAB — COMPLETE METABOLIC PANEL WITH GFR
AG Ratio: 1.8 (calc) (ref 1.0–2.5)
ALT: 13 U/L (ref 6–29)
AST: 20 U/L (ref 10–35)
Albumin: 4.4 g/dL (ref 3.6–5.1)
Alkaline phosphatase (APISO): 91 U/L (ref 37–153)
BUN: 19 mg/dL (ref 7–25)
CO2: 25 mmol/L (ref 20–32)
Calcium: 10.3 mg/dL (ref 8.6–10.4)
Chloride: 106 mmol/L (ref 98–110)
Creat: 0.84 mg/dL (ref 0.60–0.95)
Globulin: 2.4 g/dL (calc) (ref 1.9–3.7)
Glucose, Bld: 92 mg/dL (ref 65–99)
Potassium: 4.2 mmol/L (ref 3.5–5.3)
Sodium: 140 mmol/L (ref 135–146)
Total Bilirubin: 0.4 mg/dL (ref 0.2–1.2)
Total Protein: 6.8 g/dL (ref 6.1–8.1)
eGFR: 69 mL/min/{1.73_m2} (ref >=60)

## 2021-09-18 LAB — CBC WITH DIFFERENTIAL/PLATELET
Absolute Monocytes: 479 cells/uL (ref 200–950)
Basophils Absolute: 39 cells/uL (ref 0–200)
Basophils Relative: 0.7 %
Eosinophils Absolute: 248 cells/uL (ref 15–500)
Eosinophils Relative: 4.5 %
HCT: 37 % (ref 35.0–45.0)
Hemoglobin: 12.1 g/dL (ref 11.7–15.5)
Lymphs Abs: 1727 cells/uL (ref 850–3900)
MCH: 30.9 pg (ref 27.0–33.0)
MCHC: 32.7 g/dL (ref 32.0–36.0)
MCV: 94.6 fL (ref 80.0–100.0)
MPV: 10.3 fL (ref 7.5–12.5)
Monocytes Relative: 8.7 %
Neutro Abs: 3009 cells/uL (ref 1500–7800)
Neutrophils Relative %: 54.7 %
Platelets: 327 10*3/uL (ref 140–400)
RBC: 3.91 10*6/uL (ref 3.80–5.10)
RDW: 14.8 % (ref 11.0–15.0)
Total Lymphocyte: 31.4 %
WBC: 5.5 10*3/uL (ref 3.8–10.8)

## 2021-09-18 LAB — LIPID PANEL
Cholesterol: 191 mg/dL (ref <200)
HDL: 67 mg/dL (ref >=50)
LDL Cholesterol (Calc): 105 mg/dL (calc) — ABNORMAL HIGH
Non-HDL Cholesterol (Calc): 124 mg/dL (calc) (ref <130)
Total CHOL/HDL Ratio: 2.9 (calc) (ref <5.0)
Triglycerides: 94 mg/dL (ref <150)

## 2021-09-18 LAB — HEMOGLOBIN A1C
Hgb A1c MFr Bld: 5.5 % of total Hgb (ref <5.7)
Mean Plasma Glucose: 111 mg/dL
eAG (mmol/L): 6.2 mmol/L

## 2021-09-18 LAB — MAGNESIUM: Magnesium: 2.1 mg/dL (ref 1.5–2.5)

## 2021-09-18 LAB — TSH: TSH: 2.14 mIU/L (ref 0.40–4.50)

## 2021-09-25 ENCOUNTER — Ambulatory Visit
Admission: RE | Admit: 2021-09-25 | Discharge: 2021-09-25 | Disposition: A | Discharge status: Home / Self Care | Payer: Medicare Other | Source: Ambulatory Visit | Attending: Adult Health | Admitting: Adult Health

## 2021-09-25 ENCOUNTER — Other Ambulatory Visit: Payer: Self-pay

## 2021-09-25 DIAGNOSIS — S32592D Other specified fracture of left pubis, subsequent encounter for fracture with routine healing: Secondary | ICD-10-CM | POA: Diagnosis not present

## 2021-09-25 DIAGNOSIS — S32512D Fracture of superior rim of left pubis, subsequent encounter for fracture with routine healing: Secondary | ICD-10-CM | POA: Diagnosis not present

## 2021-09-25 DIAGNOSIS — S32592A Other specified fracture of left pubis, initial encounter for closed fracture: Secondary | ICD-10-CM

## 2021-09-25 DIAGNOSIS — K8020 Calculus of gallbladder without cholecystitis without obstruction: Secondary | ICD-10-CM | POA: Diagnosis not present

## 2021-10-03 NOTE — Progress Notes (Signed)
Cardiology Office Note   Date:  10/05/2021   ID:  Xenia, Nile 09-26-1940, MRN [id number]  PCP:  [name], MD  Cardiologist:   None Referring:  [name], NP  Chief Complaint  Patient presents with   Heart Murmur      History of Present Illness: Angelica Romero is a 81 y.o. female who is referred by [name], NP for evaluation of a murmur.  She was told recently when she had a fractured pelvis that she had a murmur.  She has not had this before.  This was also noted by her primary provider and she is referred here because of that.  She has not had any prior cardiac history.  She lives alone and does her own chores.  She fell recently tripping over a throw rug that the kittens had messed up.  She has been somewhat limited because of this now although she still does her own chores.  She has not had any cardiovascular complaints. The patient denies any new symptoms such as chest discomfort, neck or arm discomfort. There has been no new shortness of breath, PND or orthopnea. There have been no reported palpitations, presyncope or syncope.   She has had no prior cardiac work-up or history  Past Medical History:  Diagnosis Date   Elevated BP    Prediabetes    Vitamin D deficiency     Past Surgical History:  Procedure Laterality Date   FOOT SURGERY     Lumbar back surgery     TONSILLECTOMY N/A    VAGINAL HYSTERECTOMY       Current Outpatient Medications  Medication Sig Dispense Refill   allopurinol (ZYLOPRIM) 300 MG tablet TAKE 1 TABLET BY MOUTH ONCE DAILY TO  PREVENT  GOUT (Patient taking differently: Take 150 mg by mouth daily as needed (gout).) 90 tablet 3   Ascorbic Acid (VITAMIN C GUMMIE PO) Take 1 tablet by mouth daily.     aspirin EC 81 MG tablet Take 81 mg by mouth daily. Reported on 07/31/2015     Cholecalciferol (VITAMIN D PO) Take 5,000 Units by mouth daily.     colchicine 0.6 MG tablet TAKE 1 TABLET BY MOUTH ONCE DAILY AS NEEDED(STARTING  WITH ONSET OF GOUT FLARE) 30 tablet 1   feeding supplement (ENSURE ENLIVE / ENSURE PLUS) LIQD Take 237 mLs by mouth 2 (two) times daily between meals. 237 mL 12   Multiple Vitamin (MULTIVITAMIN WITH MINERALS) TABS tablet Take 1 tablet by mouth daily. 30 tablet 0   telmisartan (MICARDIS) 80 MG tablet Take  1 tablet  Daily  for BP / Patient knows to take by mouth  !   - Thanks (Patient taking differently: Take 80 mg by mouth daily.) 90 tablet 3   amitriptyline (ELAVIL) 10 MG tablet Take 1-2 tablets (10-20 mg total) by mouth at bedtime as needed for sleep. (Patient not taking: Reported on 10/05/2021) 60 tablet 0   No current facility-administered medications for this visit.    Allergies:   Codeine, Hydrocodone, Lisinopril, Omeprazole, and Epinephrine    Social History:  The patient  reports that she has never smoked. She has never used smokeless tobacco. She reports that she does not drink alcohol.   Family History:  The patient's family history includes Breast cancer in her mother; Heart attack (age of onset: 49) in her father; Heart attack (age of onset: 63) in her mother.    ROS:  Please see the history of  present illness.   Otherwise, review of systems are positive for none.   All other systems are reviewed and negative.    PHYSICAL EXAM: VS:  BP 124/66    Pulse 67    Ht 5\' 1"  (1.549 m)    Wt 169 lb 3.2 oz (76.7 kg)    SpO2 97%    BMI 31.97 kg/m  , BMI Body mass index is 31.97 kg/m. GENERAL:  Well appearing HEENT:  Pupils equal round and reactive, fundi not visualized, oral mucosa unremarkable NECK:  No jugular venous distention, waveform within normal limits, carotid upstroke brisk and symmetric, no bruits, no thyromegaly LYMPHATICS:  No cervical, inguinal adenopathy LUNGS:  Clear to auscultation bilaterally BACK:  No CVA tenderness CHEST:  Unremarkable HEART:  PMI not displaced or sustained,S1 and S2 within normal limits, no S3, no S4, no clicks, no rubs, 3 out of 6 apical systolic  murmur early to mid peaking and radiating at the aortic outflow tract, no change with Valsalva, no diastolic murmurs ABD:  Flat, positive bowel sounds normal in frequency in pitch, no bruits, no rebound, no guarding, no midline pulsatile mass, no hepatomegaly, no splenomegaly EXT:  2 plus pulses throughout, no edema, no cyanosis no clubbing SKIN:  No rashes no nodules NEURO:  Cranial nerves II through XII grossly intact, motor grossly intact throughout PSYCH:  Cognitively intact, oriented to person place and time    EKG:  EKG is ordered today. The ekg ordered today demonstrates sinus rhythm, rate 67, axis within normal limits, intervals within normal limits, no acute ST-T wave changes.   Recent Labs: 09/17/2021: ALT 13; BUN 19; Creat 0.84; Hemoglobin 12.1; Magnesium 2.1; Platelets 327; Potassium 4.2; Sodium 140; TSH 2.14    Lipid Panel    Component Value Date/Time   CHOL 191 09/17/2021 1030   TRIG 94 09/17/2021 1030   HDL 67 09/17/2021 1030   CHOLHDL 2.9 09/17/2021 1030   VLDL 14 09/08/2016 0937   LDLCALC 105 (H) 09/17/2021 1030      Wt Readings from Last 3 Encounters:  10/05/21 169 lb 3.2 oz (76.7 kg)  09/17/21 166 lb (75.3 kg)  07/17/21 160 lb (72.6 kg)      Other studies Reviewed: Additional studies/ records that were reviewed today include: Labs. Review of the above records demonstrates:  Please see elsewhere in the note.     ASSESSMENT AND PLAN:  Murmur: This represents aortic stenosis probably mild to moderate.  I will check an echocardiogram to confirm.  I am sure we will be able to follow this clinically and currently she has no symptoms related.  Hypertension: Her blood pressure is controlled.  Continue the meds as listed.   Current medicines are reviewed at length with the patient today.  The patient does not have concerns regarding medicines.  The following changes have been made:  no change  Labs/ tests ordered today include:   Orders Placed This  Encounter  Procedures   EKG 12-Lead   ECHOCARDIOGRAM COMPLETE     Disposition:   FU with me in 1 year or sooner based on the results of the above   Signed, Minus Breeding, MD  10/05/2021 12:24 PM    [place]

## 2021-10-05 ENCOUNTER — Encounter: Payer: Self-pay | Admitting: Cardiology

## 2021-10-05 ENCOUNTER — Other Ambulatory Visit: Payer: Self-pay

## 2021-10-05 ENCOUNTER — Ambulatory Visit: Payer: Medicare Other | Admitting: Cardiology

## 2021-10-05 VITALS — BP 124/66 | HR 67 | Ht 61.0 in | Wt 169.2 lb

## 2021-10-05 DIAGNOSIS — R011 Cardiac murmur, unspecified: Secondary | ICD-10-CM | POA: Diagnosis not present

## 2021-10-05 NOTE — Patient Instructions (Signed)
Medication Instructions:  Your physician recommends that you continue on your current medications as directed. Please refer to the Current Medication list given to you today.  *If you need a refill on your cardiac medications before your next appointment, please call your pharmacy*   Testing/Procedures: Your physician has requested that you have an echocardiogram. Echocardiography is a painless test that uses sound waves to create images of your heart. It provides your doctor with information about the size and shape of your heart and how well your hearts chambers and valves are working. This procedure takes approximately one hour. There are no restrictions for this procedure. This procedure will be done at [address]    Follow-Up: At [facility], you and your health needs are our priority.  As part of our continuing mission to provide you with exceptional heart care, we have created designated Provider Care Teams.  These Care Teams include your primary Cardiologist (physician) and Advanced Practice Providers (APPs -  Physician Assistants and Nurse Practitioners) who all work together to provide you with the care you need, when you need it.  We recommend signing up for the patient portal called "MyChart".  Sign up information is provided on this After Visit Summary.  MyChart is used to connect with patients for Virtual Visits (Telemedicine).  Patients are able to view lab/test results, encounter notes, upcoming appointments, etc.  Non-urgent messages can be sent to your provider as well.   To learn more about what you can do with MyChart, go to [email or web address].    Your next appointment:   12 month(s)  The format for your next appointment:   In Person  Provider:   Minus Breeding, MD

## 2021-10-07 DIAGNOSIS — M5416 Radiculopathy, lumbar region: Secondary | ICD-10-CM | POA: Diagnosis not present

## 2021-10-07 DIAGNOSIS — I10 Essential (primary) hypertension: Secondary | ICD-10-CM | POA: Diagnosis not present

## 2021-10-08 ENCOUNTER — Other Ambulatory Visit: Payer: Self-pay | Admitting: Neurosurgery

## 2021-10-14 NOTE — Pre-Procedure Instructions (Signed)
Surgical Instructions ? ? ? Your procedure is scheduled on 10/19/21. ? Report to [facility] Main Entrance "A" at 07:30 A.M., then check in with the Admitting office. ? Call this number if you have problems the morning of surgery: ? [phone] ? ? If you have any questions prior to your surgery date call [phone]: Open Monday-Friday 8am-4pm ? ? ? Remember: ? Do not eat or drink after midnight the night before your surgery ? ?  ? Take these medicines the morning of surgery with A SIP OF WATER:  ?allopurinol (ZYLOPRIM) ? ? ?As of today, STOP taking any Aspirin (unless otherwise instructed by your surgeon) Aleve, Naproxen, Ibuprofen, Motrin, Advil, Goody's, BC's, all herbal medications, fish oil, and all vitamins. ? ?         ?Do not wear jewelry or makeup ?Do not wear lotions, powders, perfumes/colognes, or deodorant. ?Do not shave 48 hours prior to surgery.  Men may shave face and neck. ?Do not bring valuables to the hospital. ?Do not wear nail polish, gel polish, artificial nails, or any other type of covering on natural nails (fingers and toes) ?If you have artificial nails or gel coating that need to be removed by a nail salon, please have this removed prior to surgery. Artificial nails or gel coating may interfere with anesthesia's ability to adequately monitor your vital signs. ? ?[facility] is not responsible for any belongings or valuables. .  ? ?Do NOT Smoke (Tobacco/Vaping)  24 hours prior to your procedure ? ?If you use a CPAP at night, you may bring your mask for your overnight stay. ?  ?Contacts, glasses, hearing aids, dentures or partials may not be worn into surgery, please bring cases for these belongings ?  ?For patients admitted to the hospital, discharge time will be determined by your treatment team. ?  ?Patients discharged the day of surgery will not be allowed to drive home, and someone needs to stay with them for 24 hours. ? ?NO VISITORS WILL BE ALLOWED IN PRE-OP WHERE PATIENTS ARE PREPPED  FOR SURGERY.  ONLY 1 SUPPORT PERSON MAY BE PRESENT IN THE WAITING ROOM WHILE YOU ARE IN SURGERY.  IF YOU ARE TO BE ADMITTED, ONCE YOU ARE IN YOUR ROOM YOU WILL BE ALLOWED TWO (2) VISITORS. 1 (ONE) VISITOR MAY STAY OVERNIGHT BUT MUST ARRIVE TO THE ROOM BY 8pm.  Minor children may have two parents present. Special consideration for safety and communication needs will be reviewed on a case by case basis. ? ?Special instructions:   ? ?Oral Hygiene is also important to reduce your risk of infection.  Remember - BRUSH YOUR TEETH THE MORNING OF SURGERY WITH YOUR REGULAR TOOTHPASTE ? ? ?- Preparing For Surgery ? ?Before surgery, you can play an important role. Because skin is not sterile, your skin needs to be as free of germs as possible. You can reduce the number of germs on your skin by washing with CHG (chlorahexidine gluconate) Soap before surgery.  CHG is an antiseptic cleaner which kills germs and bonds with the skin to continue killing germs even after washing.   ? ? ?Please do not use if you have an allergy to CHG or antibacterial soaps. If your skin becomes reddened/irritated stop using the CHG.  ?Do not shave (including legs and underarms) for at least 48 hours prior to first CHG shower. It is OK to shave your face. ? ?Please follow these instructions carefully. ?  ? ? Shower the [employer] SURGERY and the  MORNING OF SURGERY with CHG Soap.  ? If you chose to wash your hair, wash your hair first as usual with your normal shampoo. After you shampoo, rinse your hair and body thoroughly to remove the shampoo.  Then [employer] and genitals (private parts) with your normal soap and rinse thoroughly to remove soap. ? ?After that Use CHG Soap as you would any other liquid soap. You can apply CHG directly to the skin and wash gently with a scrungie or a clean washcloth.  ? ?Apply the CHG Soap to your body ONLY FROM THE NECK DOWN.  Do not use on open wounds or open sores. Avoid contact with your eyes, ears,  mouth and genitals (private parts). Wash Face and genitals (private parts)  with your normal soap.  ? ?Wash thoroughly, paying special attention to the area where your surgery will be performed. ? ?Thoroughly rinse your body with warm water from the neck down. ? ?DO NOT shower/wash with your normal soap after using and rinsing off the CHG Soap. ? ?Pat yourself dry with a CLEAN TOWEL. ? ?Wear CLEAN PAJAMAS to bed the night before surgery ? ?Place CLEAN SHEETS on your bed the night before your surgery ? ?DO NOT SLEEP WITH PETS. ? ? ?Day of Surgery: ? ?Take a shower with CHG soap. ?Wear Clean/Comfortable clothing the morning of surgery ?Do not apply any deodorants/lotions.   ?Remember to brush your teeth WITH YOUR REGULAR TOOTHPASTE. ? ? ? ?COVID testing ? ?If you are going to stay overnight or be admitted after your procedure/surgery and require a pre-op COVID test, please follow these instructions after your COVID test  ? ?You are not required to quarantine however you are required to wear a well-fitting mask when you are out and around people not in your household.  If your mask becomes wet or soiled, replace with a new one. ? ?Wash your hands often with soap and water for 20 seconds or clean your hands with an alcohol-based hand sanitizer that contains at least 60% alcohol. ? ?Do not share personal items. ? ?Notify your provider: ?if you are in close contact with someone who has COVID  ?or if you develop a fever of 100.4 or greater, sneezing, cough, sore throat, shortness of breath or body aches. ? ?  ?Please read over the following fact sheets that you were given.  ? ?

## 2021-10-15 ENCOUNTER — Ambulatory Visit (HOSPITAL_BASED_OUTPATIENT_CLINIC_OR_DEPARTMENT_OTHER): Payer: Medicare Other

## 2021-10-15 ENCOUNTER — Encounter (HOSPITAL_COMMUNITY)
Admission: RE | Admit: 2021-10-15 | Discharge: 2021-10-15 | Disposition: A | Discharge status: Home / Self Care | Payer: Medicare Other | Source: Ambulatory Visit | Attending: Neurosurgery | Admitting: Neurosurgery

## 2021-10-15 ENCOUNTER — Encounter (HOSPITAL_COMMUNITY): Payer: Self-pay

## 2021-10-15 ENCOUNTER — Other Ambulatory Visit: Payer: Self-pay

## 2021-10-15 VITALS — BP 134/74 | HR 79 | Temp 98.2°F | Resp 18 | Ht 61.0 in | Wt 163.0 lb

## 2021-10-15 DIAGNOSIS — Z8616 Personal history of COVID-19: Secondary | ICD-10-CM | POA: Insufficient documentation

## 2021-10-15 DIAGNOSIS — R011 Cardiac murmur, unspecified: Secondary | ICD-10-CM

## 2021-10-15 DIAGNOSIS — Z20822 Contact with and (suspected) exposure to covid-19: Secondary | ICD-10-CM | POA: Insufficient documentation

## 2021-10-15 DIAGNOSIS — I10 Essential (primary) hypertension: Secondary | ICD-10-CM | POA: Diagnosis not present

## 2021-10-15 DIAGNOSIS — R7303 Prediabetes: Secondary | ICD-10-CM | POA: Diagnosis not present

## 2021-10-15 DIAGNOSIS — J45909 Unspecified asthma, uncomplicated: Secondary | ICD-10-CM | POA: Diagnosis not present

## 2021-10-15 DIAGNOSIS — Z01818 Encounter for other preprocedural examination: Secondary | ICD-10-CM | POA: Insufficient documentation

## 2021-10-15 HISTORY — DX: Unspecified osteoarthritis, unspecified site: M19.90

## 2021-10-15 HISTORY — DX: Cardiac murmur, unspecified: R01.1

## 2021-10-15 HISTORY — DX: Unspecified asthma, uncomplicated: J45.909

## 2021-10-15 HISTORY — DX: Other complications of anesthesia, initial encounter: T88.59XA

## 2021-10-15 LAB — CBC
HCT: 39.2 % (ref 36.0–46.0)
Hemoglobin: 12.5 g/dL (ref 12.0–15.0)
MCH: 31.1 pg (ref 26.0–34.0)
MCHC: 31.9 g/dL (ref 30.0–36.0)
MCV: 97.5 fL (ref 80.0–100.0)
Platelets: 268 10*3/uL (ref 150–400)
RBC: 4.02 MIL/uL (ref 3.87–5.11)
RDW: 15.5 % (ref 11.5–15.5)
WBC: 9.2 10*3/uL (ref 4.0–10.5)
nRBC: 0 % (ref 0.0–0.2)

## 2021-10-15 LAB — BASIC METABOLIC PANEL
Anion gap: 8 (ref 5–15)
BUN: 21 mg/dL (ref 8–23)
CO2: 22 mmol/L (ref 22–32)
Calcium: 9.7 mg/dL (ref 8.9–10.3)
Chloride: 108 mmol/L (ref 98–111)
Creatinine, Ser: 0.72 mg/dL (ref 0.44–1.00)
GFR, Estimated: >60 mL/min (ref >60)
Glucose, Bld: 96 mg/dL (ref 70–99)
Potassium: 4.3 mmol/L (ref 3.5–5.1)
Sodium: 138 mmol/L (ref 135–145)

## 2021-10-15 LAB — SURGICAL PCR SCREEN
MRSA, PCR: NEGATIVE
Staphylococcus aureus: NEGATIVE

## 2021-10-15 LAB — ECHOCARDIOGRAM COMPLETE
AR max vel: 1.5 cm2
AV Area VTI: 1.5 cm2
AV Area mean vel: 1.49 cm2
AV Mean grad: 13.5 mmHg
AV Peak grad: 25 mmHg
Ao pk vel: 2.5 m/s
Area-P 1/2: 3.03 cm2
Height: 61 in
S' Lateral: 2.4 cm
Weight: 2608 oz

## 2021-10-15 LAB — SARS CORONAVIRUS 2 (TAT 6-24 HRS): SARS Coronavirus 2: NEGATIVE

## 2021-10-15 NOTE — Progress Notes (Signed)
PCP - Dr. [name] ?Cardiologist - Dr. Minus Breeding ? ?PPM/ICD - Denies ? ?Chest x-ray - N/A ?EKG - 10/05/21 ?Stress Test - Denies ?ECHO - Scheduled for today 10/15/21 ?Cardiac Cath - Denies ? ?Sleep Study - Denies ? ?Patient denies having diabetes. ? ?Blood Thinner Instructions: N/A ?Aspirin Instructions: LD: 10/07/21 ? ?ERAS Protcol - No ? ?COVID TEST- 10/15/21 in PAT ? ? ?Anesthesia review: Yes, cardiac hx; patient scheduled for ECHO today 10/15/21 with Dr. Minus Breeding. ? ?Patient denies shortness of breath, fever, cough and chest pain at PAT appointment ? ? ?All instructions explained to the patient, with a verbal understanding of the material. Patient agrees to go over the instructions while at home for a better understanding. Patient also instructed to self quarantine after being tested for COVID-19. The opportunity to ask questions was provided. ? ? ?

## 2021-10-15 NOTE — Pre-Procedure Instructions (Signed)
Surgical Instructions ? ? ? Your procedure is scheduled on Monday, October 19, 2021 at 9:30 AM. ? [facility] at 07:30 A.M., then check in with the Admitting office. ? Call this number if you have problems the morning of surgery: ? [phone] ? ? If you have any questions prior to your surgery date call [phone]: Open Monday-Friday 8am-4pm ? ? ? Remember: ? Do not eat or drink after midnight the night before your surgery ? ?  ? Take these medicines the morning of surgery with A SIP OF WATER:  ? ?allopurinol (ZYLOPRIM) ?Colchicine - if needed ? ?Follow your surgeon's instructions on when to stop Aspirin.  If no instructions were given by your surgeon then you will need to call the office to get those instructions.   ? ? ?As of today, STOP taking any Aleve, Naproxen, Ibuprofen, Motrin, Advil, Goody's, BC's, all herbal medications, fish oil, and all vitamins. ? ?Do NOT Smoke (Tobacco/Vaping)  24 hours prior to your procedure ? ?If you use a CPAP at night, you may bring your mask for your overnight stay. ?  ?Contacts, glasses, hearing aids, dentures or partials may not be worn into surgery, please bring cases for these belongings ?  ?For patients admitted to the hospital, discharge time will be determined by your treatment team. ?  ?Patients discharged the day of surgery will not be allowed to drive home, and someone needs to stay with them for 24 hours. ? ?NO VISITORS WILL BE ALLOWED IN PRE-OP WHERE PATIENTS ARE PREPPED FOR SURGERY.  ONLY 1 SUPPORT PERSON MAY BE PRESENT IN THE WAITING ROOM WHILE YOU ARE IN SURGERY.  IF YOU ARE TO BE ADMITTED, ONCE YOU ARE IN YOUR ROOM YOU WILL BE ALLOWED TWO (2) VISITORS. 1 (ONE) VISITOR MAY STAY OVERNIGHT BUT MUST ARRIVE TO THE ROOM BY 8pm.  Minor children may have two parents present. Special consideration for safety and communication needs will be reviewed on a case by case basis. ? ?Special instructions:   ? ?Oral Hygiene is also important to reduce  your risk of infection.  Remember - BRUSH YOUR TEETH THE MORNING OF SURGERY WITH YOUR REGULAR TOOTHPASTE ? ? ?[place]- Preparing For Surgery ? ?Before surgery, you can play an important role. Because skin is not sterile, your skin needs to be as free of germs as possible. You can reduce the number of germs on your skin by washing with CHG (chlorahexidine gluconate) Soap before surgery.  CHG is an antiseptic cleaner which kills germs and bonds with the skin to continue killing germs even after washing.   ? ? ?Please do not use if you have an allergy to CHG or antibacterial soaps. If your skin becomes reddened/irritated stop using the CHG.  ?Do not shave (including legs and underarms) for at least 48 hours prior to first CHG shower. It is OK to shave your face. ? ?Please follow these instructions carefully. ?  ? ? Shower the NIGHT BEFORE SURGERY and the MORNING OF SURGERY with CHG Soap.  ? If you chose to wash your hair, wash your hair first as usual with your normal shampoo. After you shampoo, rinse your hair and body thoroughly to remove the shampoo.  Then [employer] and genitals (private parts) with your normal soap and rinse thoroughly to remove soap. ? ?After that Use CHG Soap as you would any other liquid soap. You can apply CHG directly to the skin and wash gently with a scrungie  or a clean washcloth.  ? ?Apply the CHG Soap to your body ONLY FROM THE NECK DOWN.  Do not use on open wounds or open sores. Avoid contact with your eyes, ears, mouth and genitals (private parts). Wash Face and genitals (private parts)  with your normal soap.  ? ?Wash thoroughly, paying special attention to the area where your surgery will be performed. ? ?Thoroughly rinse your body with warm water from the neck down. ? ?DO NOT shower/wash with your normal soap after using and rinsing off the CHG Soap. ? ?Pat yourself dry with a CLEAN TOWEL. ? ?Wear CLEAN PAJAMAS to bed the night before surgery ? ?Place CLEAN SHEETS on your bed the  night before your surgery ? ?DO NOT SLEEP WITH PETS. ? ? ?Day of Surgery: ? ?Take a shower with CHG soap. ?Wear Clean/Comfortable clothing the morning of surgery ?Do not apply any deodorants/lotions.   ?Remember to brush your teeth WITH YOUR REGULAR TOOTHPASTE. ? ?Do not wear jewelry or makeup ?Do not wear lotions, powders, perfumes/colognes, or deodorant. ?Do not shave 48 hours prior to surgery. ?Do not bring valuables to the hospital. ?Do not wear nail polish, gel polish, artificial nails, or any other type of covering on natural nails (fingers and toes) ?If you have artificial nails or gel coating that need to be removed by a nail salon, please have this removed prior to surgery. Artificial nails or gel coating may interfere with anesthesia's ability to adequately monitor your vital signs. ? ?[facility] is not responsible for any belongings or valuables. .  ? ? ? ?COVID testing ? ?If you are going to stay overnight or be admitted after your procedure/surgery and require a pre-op COVID test, please follow these instructions after your COVID test  ? ?You are not required to quarantine however you are required to wear a well-fitting mask when you are out and around people not in your household.  If your mask becomes wet or soiled, replace with a new one. ? ?Wash your hands often with soap and water for 20 seconds or clean your hands with an alcohol-based hand sanitizer that contains at least 60% alcohol. ? ?Do not share personal items. ? ?Notify your provider: ?if you are in close contact with someone who has COVID  ?or if you develop a fever of 100.4 or greater, sneezing, cough, sore throat, shortness of breath or body aches. ? ?  ?Please read over the following fact sheets that you were given.  ? ?

## 2021-10-16 ENCOUNTER — Encounter (HOSPITAL_COMMUNITY): Payer: Self-pay

## 2021-10-16 NOTE — Anesthesia Preprocedure Evaluation (Addendum)
Anesthesia Evaluation  ?Patient identified by MRN, date of birth, ID band ?Patient awake ? ? ? ?Reviewed: ?Allergy & Precautions, NPO status , Patient's Chart, lab work & pertinent test results ? ?History of Anesthesia Complications ?(+) PROLONGED EMERGENCE and history of anesthetic complications ? ?Airway ?Mallampati: II ? ?TM Distance: >3 FB ?Neck ROM: Full ? ? ? Dental ?no notable dental hx. ? ?  ?Pulmonary ?asthma ,  ?  ?Pulmonary exam normal ? ? ? ? ? ? ? Cardiovascular ?hypertension, Pt. on medications ?+ Valvular Problems/Murmurs AS  ?Rhythm:Regular Rate:Normal ? ?Echo 10/15/21: ?IMPRESSIONS  ??1. Left ventricular ejection fraction, by estimation, is 60 to 65%. Left  ?ventricular ejection fraction by 3D volume is 63 %. The left ventricle has  ?normal function. The left ventricle has no regional wall motion  ?abnormalities. Left ventricular diastolic  ??parameters are indeterminate. Elevated left ventricular end-diastolic  ?pressure.  ??2. Right ventricular systolic function is normal. The right ventricular  ?size is normal. Tricuspid regurgitation signal is inadequate for assessing  ?PA pressure.  ??3. The mitral valve is normal in structure. No evidence of mitral valve  ?regurgitation. No evidence of mitral stenosis.  ??4. The aortic valve is calcified. Aortic valve regurgitation is not  ?visualized. Mild to moderate aortic valve stenosis. Aortic valve area, by  ?VTI measures 1.50 cm?Marland Kitchen Aortic valve mean gradient measures 13.5 mmHg.  ?Aortic valve Vmax measures 2.50 m/s.  ??5. The inferior vena cava is normal in size with greater than 50%  ?respiratory variability, suggesting right atrial pressure of 3 mmHg.  ?? ?  ?Neuro/Psych ?negative neurological ROS ? negative psych ROS  ? GI/Hepatic ?negative GI ROS, Neg liver ROS,   ?Endo/Other  ?negative endocrine ROS ? Renal/GU ?negative Renal ROS  ?negative genitourinary ?  ?Musculoskeletal ? ?(+) Arthritis ,  ? Abdominal ?Normal  abdominal exam  (+)   ?Peds ? Hematology ?negative hematology ROS ?(+)   ?Anesthesia Other Findings ? ? Reproductive/Obstetrics ? ?  ? ? ? ? ? ? ? ? ? ? ? ? ? ?  ?  ? ? ? ? ? ? ?Anesthesia Physical ?Anesthesia Plan ? ?ASA: 3 ? ?Anesthesia Plan: General  ? ?Post-op Pain Management: Tylenol PO (pre-op)*  ? ?Induction: Intravenous ? ?PONV Risk Score and Plan: 3 and Ondansetron, Dexamethasone and Treatment may vary due to age or medical condition ? ?Airway Management Planned: Mask and Oral ETT ? ?Additional Equipment: None ? ?Intra-op Plan:  ? ?Post-operative Plan: Extubation in OR ? ?Informed Consent: I have reviewed the patients History and Physical, chart, labs and discussed the procedure including the risks, benefits and alternatives for the proposed anesthesia with the patient or authorized representative who has indicated his/her understanding and acceptance.  ? ? ? ?Dental advisory given ? ?Plan Discussed with: CRNA ? ?Anesthesia Plan Comments: (PAT note written 10/16/2021 by Myra Gianotti, PA-C. ?Lab Results ?     Component                Value               Date                 ?     WBC                      9.2                 10/15/2021           ?  HGB                      12.5                10/15/2021           ?     HCT                      39.2                10/15/2021           ?     MCV                      97.5                10/15/2021           ?     PLT                      268                 10/15/2021           ?Lab Results ?     Component                Value               Date                 ?     NA                       138                 10/15/2021           ?     K                        4.3                 10/15/2021           ?     CO2                      22                  10/15/2021           ?     GLUCOSE                  96                  10/15/2021           ?     BUN                      21                  10/15/2021           ?     CREATININE               0.72                 10/15/2021           ?     CALCIUM  9.7                 10/15/2021           ?     EGFR                     69                  09/17/2021           ?     GFRNONAA                 >60                 10/15/2021           ?Echo 10/15/21: ?IMPRESSIONS  ??1. Left ventricular ejection fraction, by estimation, is 60 to 65%. Left  ?ventricular ejection fraction by 3D volume is 63 %. The left ventricle has  ?normal function. The left ventricle has no regional wall motion  ?abnormalities. Left ventricular diastolic  ??parameters are indeterminate. Elevated left ventricular end-diastolic  ?pressure.  ??2. Right ventricular systolic function is normal. The right ventricular  ?size is normal. Tricuspid regurgitation signal is inadequate for assessing  ?PA pressure.  ??3. The mitral valve is normal in structure. No evidence of mitral valve  ?regurgitation. No evidence of mitral stenosis.  ??4. The aortic valve is calcified. Aortic valve regurgitation is not  ?visualized. Mild to moderate aortic valve stenosis. Aortic valve area, by  ?VTI measures 1.50 cm?Marland Kitchen Aortic valve mean gradient measures 13.5 mmHg.  ?Aortic valve Vmax measures 2.50 m/s.  ??5. The inferior vena cava is normal in size with greater than 50%  ?respiratory variability, suggesting right atrial pressure of 3 mmHg.  ??)  ? ? ? ? ?Anesthesia Quick Evaluation ? ?

## 2021-10-16 NOTE — Progress Notes (Signed)
Anesthesia Chart Review: ? Case: [id number] Date/Time: 10/19/21 0914  ? Procedure: Laminectomy and Foraminotomy - right - L2-L3 - L3-L4 - L4-L5 (Right: Back)  ? Anesthesia type: General  ? Pre-op diagnosis: Stenosis  ? [facility]: [facility] ROOM [facility] / [facility]  ? Surgeons: Earnie Larsson, MD  ? ?  ? ? ?DISCUSSION: Patient is an 81 year old female scheduled for the above procedure. ? ?History includes never smoker, HTN, asthma, prediabetes, murmur (mild-moderate AS 10/15/21), pubic ramus fracture (left 07/17/21), COVID-19 (07/17/21), spinal surgery. Reported "hard to wake up" after anesthesia.  ? ?[place] admission 07/17/21-07/23/21 for mechanical fall.  She was on the ground for 4 hours.  Her daughter found her and called EMS.  X-ray showed left inferior pubic ramus fracture.  COVID-19 testing for admission was positive.  CXR also showed evidence of bibasilar pneumonia. No hypoxemia. S/p remdesivir.  Orthopedics recommended physical therapy for mobilization with partial weightbearing to LLE with progression to weightbearing as tolerated. ? ?She had a cardiology evaluation by Dr. Percival Spanish on 10/05/2021 finding of a murmur.  Denied chest pain or new shortness of breath, palpitations, presyncope or syncope.  He suspected murmur represented mild to moderate AS. He ordered an echo to confirm, and suspected it could be followed clinically for now as currently no symptoms. 10/15/21 echo did confirm AS was mild to moderate.  LVEF was 60 to 65% with no regional wall motion abnormalities, normal RVSF. ? ?Reported last aspirin 10/07/2021.  Anesthesia team to evaluate on the day of surgery. ? ? ?VS: BP 134/74   Pulse 79   Temp 36.8 ?C (Oral)   Resp 18   Ht '5\' 1"'$  (1.549 m)   Wt 73.9 kg   SpO2 100%   BMI 30.80 kg/m?  ? ?PROVIDERS: ?[name], MD is PCP  ?Minus Breeding, MD is cardiologist ? ? ?LABS: Labs reviewed: Acceptable for surgery. LFT normal and A1c 5.5% on 09/17/21. ?(all labs ordered are listed, but only abnormal results are  displayed) ? ?Labs Reviewed  ?SURGICAL PCR SCREEN  ?SARS CORONAVIRUS 2 (TAT 6-24 HRS)  ?CBC  ?BASIC METABOLIC PANEL  ? ? ? ?IMAGES: ?1V CXR 07/18/21 (in setting of admission for fall with left pubic ramus fracture & COVID PNA): ?FINDINGS: ?- Mild bibasilar airspace disease has developed since the prior study. ?Possible pneumonia. ?- Heart size and vascularity normal.  No pleural effusion. ?IMPRESSION: ?Bibasilar airspace disease right greater than left compatible with ?pneumonia. ?  ? ?EKG: 10/05/2021: ?Normal sinus rhythm ?Cannot rule out anterior infarct, age undetermined ? ? ?CV: ?Echo 10/15/21: ?IMPRESSIONS  ? 1. Left ventricular ejection fraction, by estimation, is 60 to 65%. Left  ?ventricular ejection fraction by 3D volume is 63 %. The left ventricle has  ?normal function. The left ventricle has no regional wall motion  ?abnormalities. Left ventricular diastolic  ? parameters are indeterminate. Elevated left ventricular end-diastolic  ?pressure.  ? 2. Right ventricular systolic function is normal. The right ventricular  ?size is normal. Tricuspid regurgitation signal is inadequate for assessing  ?PA pressure.  ? 3. The mitral valve is normal in structure. No evidence of mitral valve  ?regurgitation. No evidence of mitral stenosis.  ? 4. The aortic valve is calcified. Aortic valve regurgitation is not  ?visualized. Mild to moderate aortic valve stenosis. Aortic valve area, by  ?VTI measures 1.50 cm?Marland Kitchen Aortic valve mean gradient measures 13.5 mmHg.  ?Aortic valve Vmax measures 2.50 m/s.  ? 5. The inferior vena cava is normal in size with greater  than 50%  ?respiratory variability, suggesting right atrial pressure of 3 mmHg.  ? ? ?US Carotid 07/15/14: ?IMPRESSION: ?1. Bilateral carotid bifurcation and proximal ICA plaque, resulting ?in less than 50% diameter stenosis. The exam does not exclude plaque ?ulceration or embolization. Continued surveillance recommended. ? ? ?Past Medical History:  ?Diagnosis Date  ?  Arthritis   ? Asthma   ? Complication of anesthesia   ? "hard to wake up" per patient  ? Elevated BP   ? Heart murmur   ? mild-moderate AS 10/15/21  ? Prediabetes   ? Pubic ramus fracture ([place]) 07/17/2021  ? Vitamin D deficiency   ? ? ?Past Surgical History:  ?Procedure Laterality Date  ? APPENDECTOMY    ? FOOT SURGERY    ? Lumbar back surgery    ? TONSILLECTOMY N/A   ? VAGINAL HYSTERECTOMY    ? ? ?MEDICATIONS: ? allopurinol (ZYLOPRIM) 300 MG tablet  ? amitriptyline (ELAVIL) 10 MG tablet  ? Ascorbic Acid (VITAMIN C GUMMIE PO)  ? aspirin EC 81 MG tablet  ? Cholecalciferol (VITAMIN D PO)  ? colchicine 0.6 MG tablet  ? Multiple Vitamin (MULTIVITAMIN WITH MINERALS) TABS tablet  ? telmisartan (MICARDIS) 80 MG tablet  ? ?No current facility-administered medications for this encounter.  ? ? ?Myra Gianotti, PA-C ?Surgical Short Stay/Anesthesiology ?[facility] Phone [phone] ?[facility] Phone [phone] ?10/16/2021 11:25 AM ? ? ? ? ? ? ?

## 2021-10-19 ENCOUNTER — Ambulatory Visit (HOSPITAL_BASED_OUTPATIENT_CLINIC_OR_DEPARTMENT_OTHER): Payer: Medicare Other | Admitting: Anesthesiology

## 2021-10-19 ENCOUNTER — Ambulatory Visit (HOSPITAL_COMMUNITY): Payer: Medicare Other | Admitting: Vascular Surgery

## 2021-10-19 ENCOUNTER — Encounter (HOSPITAL_COMMUNITY)
Admission: RE | Disposition: A | Discharge status: Home / Self Care | Payer: Self-pay | Source: Home / Self Care | Attending: Neurosurgery

## 2021-10-19 ENCOUNTER — Encounter (HOSPITAL_COMMUNITY): Payer: Self-pay | Admitting: Neurosurgery

## 2021-10-19 ENCOUNTER — Other Ambulatory Visit: Payer: Self-pay

## 2021-10-19 ENCOUNTER — Ambulatory Visit (HOSPITAL_COMMUNITY): Payer: Medicare Other

## 2021-10-19 ENCOUNTER — Observation Stay (HOSPITAL_COMMUNITY)
Admission: RE | Admit: 2021-10-19 | Discharge: 2021-10-20 | Disposition: A | Discharge status: Home / Self Care | Payer: Medicare Other | Attending: Neurosurgery | Admitting: Neurosurgery

## 2021-10-19 DIAGNOSIS — M5416 Radiculopathy, lumbar region: Secondary | ICD-10-CM | POA: Diagnosis not present

## 2021-10-19 DIAGNOSIS — I10 Essential (primary) hypertension: Secondary | ICD-10-CM | POA: Diagnosis not present

## 2021-10-19 DIAGNOSIS — M1990 Unspecified osteoarthritis, unspecified site: Secondary | ICD-10-CM | POA: Diagnosis not present

## 2021-10-19 DIAGNOSIS — M48061 Spinal stenosis, lumbar region without neurogenic claudication: Secondary | ICD-10-CM

## 2021-10-19 DIAGNOSIS — M5137 Other intervertebral disc degeneration, lumbosacral region: Secondary | ICD-10-CM | POA: Diagnosis not present

## 2021-10-19 DIAGNOSIS — I350 Nonrheumatic aortic (valve) stenosis: Secondary | ICD-10-CM | POA: Diagnosis not present

## 2021-10-19 DIAGNOSIS — J45909 Unspecified asthma, uncomplicated: Secondary | ICD-10-CM | POA: Diagnosis not present

## 2021-10-19 DIAGNOSIS — M5116 Intervertebral disc disorders with radiculopathy, lumbar region: Secondary | ICD-10-CM | POA: Diagnosis not present

## 2021-10-19 DIAGNOSIS — Z419 Encounter for procedure for purposes other than remedying health state, unspecified: Secondary | ICD-10-CM

## 2021-10-19 SURGERY — LUMBAR LAMINECTOMY/DECOMPRESSION MICRODISCECTOMY 3 LEVELS
Anesthesia: General | Site: Back | Laterality: Right

## 2021-10-19 MED ORDER — HYDROMORPHONE HCL 1 MG/ML IJ SOLN: 1.0000 mg | INTRAMUSCULAR | Status: DC | PRN

## 2021-10-19 MED ORDER — CEFAZOLIN SODIUM-DEXTROSE 2-4 GM/100ML-% IV SOLN
2.0000 g | INTRAVENOUS | Status: AC
  Administered 2021-10-19: 2 g via INTRAVENOUS
  Filled 2021-10-19: qty 100

## 2021-10-19 MED ORDER — CEFAZOLIN SODIUM-DEXTROSE 1-4 GM/50ML-% IV SOLN
1.0000 g | Freq: Three times a day (TID) | INTRAVENOUS | Status: AC
  Administered 2021-10-19 – 2021-10-20 (×2): 1 g via INTRAVENOUS
  Filled 2021-10-19 (×2): qty 50

## 2021-10-19 MED ORDER — ONDANSETRON HCL 4 MG PO TABS: 4.0000 mg | ORAL_TABLET | Freq: Four times a day (QID) | ORAL | Status: DC | PRN

## 2021-10-19 MED ORDER — SUGAMMADEX SODIUM 200 MG/2ML IV SOLN
INTRAVENOUS | Status: DC | PRN
  Administered 2021-10-19: 200 mg via INTRAVENOUS

## 2021-10-19 MED ORDER — CYCLOBENZAPRINE HCL 10 MG PO TABS
10.0000 mg | ORAL_TABLET | Freq: Three times a day (TID) | ORAL | Status: DC | PRN
  Administered 2021-10-19: 10 mg via ORAL
  Filled 2021-10-19: qty 1

## 2021-10-19 MED ORDER — THROMBIN 20000 UNITS EX SOLR
CUTANEOUS | Status: AC
  Filled 2021-10-19: qty 20000

## 2021-10-19 MED ORDER — CHLORHEXIDINE GLUCONATE CLOTH 2 % EX PADS: 6.0000 | MEDICATED_PAD | Freq: Once | CUTANEOUS | Status: DC

## 2021-10-19 MED ORDER — ASPIRIN EC 81 MG PO TBEC
81.0000 mg | DELAYED_RELEASE_TABLET | Freq: Every day | ORAL | Status: DC
  Administered 2021-10-19 – 2021-10-20 (×2): 81 mg via ORAL
  Filled 2021-10-19 (×2): qty 1

## 2021-10-19 MED ORDER — SODIUM CHLORIDE 0.9% FLUSH
3.0000 mL | Freq: Two times a day (BID) | INTRAVENOUS | Status: DC
  Administered 2021-10-19 (×2): 3 mL via INTRAVENOUS

## 2021-10-19 MED ORDER — PROPOFOL 10 MG/ML IV BOLUS
INTRAVENOUS | Status: AC
  Filled 2021-10-19: qty 20

## 2021-10-19 MED ORDER — PHENYLEPHRINE 40 MCG/ML (10ML) SYRINGE FOR IV PUSH (FOR BLOOD PRESSURE SUPPORT)
PREFILLED_SYRINGE | INTRAVENOUS | Status: DC | PRN
  Administered 2021-10-19 (×5): 40 ug via INTRAVENOUS
  Administered 2021-10-19: 80 ug via INTRAVENOUS
  Administered 2021-10-19 (×2): 40 ug via INTRAVENOUS

## 2021-10-19 MED ORDER — BUPIVACAINE HCL (PF) 0.25 % IJ SOLN
INTRAMUSCULAR | Status: DC | PRN
  Administered 2021-10-19: 30 mL

## 2021-10-19 MED ORDER — ONDANSETRON HCL 4 MG/2ML IJ SOLN
INTRAMUSCULAR | Status: DC | PRN
  Administered 2021-10-19: 4 mg via INTRAVENOUS

## 2021-10-19 MED ORDER — ACETAMINOPHEN 325 MG PO TABS: 650.0000 mg | ORAL_TABLET | ORAL | Status: DC | PRN

## 2021-10-19 MED ORDER — FENTANYL CITRATE (PF) 250 MCG/5ML IJ SOLN
INTRAMUSCULAR | Status: DC | PRN
  Administered 2021-10-19 (×2): 25 ug via INTRAVENOUS
  Administered 2021-10-19: 100 ug via INTRAVENOUS
  Administered 2021-10-19 (×4): 25 ug via INTRAVENOUS

## 2021-10-19 MED ORDER — AMITRIPTYLINE HCL 10 MG PO TABS
10.0000 mg | ORAL_TABLET | Freq: Every evening | ORAL | Status: DC | PRN
  Filled 2021-10-19: qty 1

## 2021-10-19 MED ORDER — PHENYLEPHRINE HCL-NACL 20-0.9 MG/250ML-% IV SOLN
INTRAVENOUS | Status: DC | PRN
  Administered 2021-10-19: 25 ug/min via INTRAVENOUS

## 2021-10-19 MED ORDER — VITAMIN D 25 MCG (1000 UNIT) PO TABS
5000.0000 [IU] | ORAL_TABLET | Freq: Every day | ORAL | Status: DC
  Administered 2021-10-19 – 2021-10-20 (×2): 5000 [IU] via ORAL
  Filled 2021-10-19 (×2): qty 5

## 2021-10-19 MED ORDER — DEXAMETHASONE SODIUM PHOSPHATE 10 MG/ML IJ SOLN
INTRAMUSCULAR | Status: DC | PRN
Start: 2021-10-19 — End: 2021-10-19
  Administered 2021-10-19: 4 mg via INTRAVENOUS

## 2021-10-19 MED ORDER — LACTATED RINGERS IV SOLN: INTRAVENOUS | Status: DC

## 2021-10-19 MED ORDER — KETOROLAC TROMETHAMINE 15 MG/ML IJ SOLN
7.5000 mg | Freq: Four times a day (QID) | INTRAMUSCULAR | Status: AC
  Administered 2021-10-19 – 2021-10-20 (×4): 7.5 mg via INTRAVENOUS
  Filled 2021-10-19 (×4): qty 1

## 2021-10-19 MED ORDER — CHLORHEXIDINE GLUCONATE 0.12 % MT SOLN
15.0000 mL | Freq: Once | OROMUCOSAL | Status: AC
  Administered 2021-10-19: 15 mL via OROMUCOSAL
  Filled 2021-10-19: qty 15

## 2021-10-19 MED ORDER — FENTANYL CITRATE (PF) 100 MCG/2ML IJ SOLN
25.0000 ug | INTRAMUSCULAR | Status: DC | PRN
  Administered 2021-10-19: 25 ug via INTRAVENOUS
  Administered 2021-10-19: 50 ug via INTRAVENOUS

## 2021-10-19 MED ORDER — FENTANYL CITRATE (PF) 100 MCG/2ML IJ SOLN
INTRAMUSCULAR | Status: AC
  Administered 2021-10-19: 25 ug via INTRAVENOUS
  Filled 2021-10-19: qty 2

## 2021-10-19 MED ORDER — FENTANYL CITRATE (PF) 250 MCG/5ML IJ SOLN
INTRAMUSCULAR | Status: AC
  Filled 2021-10-19: qty 5

## 2021-10-19 MED ORDER — SODIUM CHLORIDE 0.9% FLUSH: 3.0000 mL | INTRAVENOUS | Status: DC | PRN

## 2021-10-19 MED ORDER — ACETAMINOPHEN 650 MG RE SUPP: 650.0000 mg | RECTAL | Status: DC | PRN

## 2021-10-19 MED ORDER — KETOROLAC TROMETHAMINE 30 MG/ML IJ SOLN
INTRAMUSCULAR | Status: DC | PRN
  Administered 2021-10-19: 15 mg via INTRAVENOUS

## 2021-10-19 MED ORDER — VITAMIN C 250 MG PO TABS
250.0000 mg | ORAL_TABLET | Freq: Every day | ORAL | Status: DC
  Administered 2021-10-19 – 2021-10-20 (×2): 250 mg via ORAL
  Filled 2021-10-19 (×2): qty 1

## 2021-10-19 MED ORDER — ACETAMINOPHEN 500 MG PO TABS
500.0000 mg | ORAL_TABLET | Freq: Four times a day (QID) | ORAL | Status: DC | PRN
  Administered 2021-10-19 (×2): 1000 mg via ORAL
  Administered 2021-10-20: 500 mg via ORAL
  Filled 2021-10-19: qty 1
  Filled 2021-10-19 (×2): qty 2

## 2021-10-19 MED ORDER — PHENOL 1.4 % MT LIQD: 1.0000 | OROMUCOSAL | Status: DC | PRN

## 2021-10-19 MED ORDER — LIDOCAINE 2% (20 MG/ML) 5 ML SYRINGE
INTRAMUSCULAR | Status: DC | PRN
  Administered 2021-10-19: 60 mg via INTRAVENOUS

## 2021-10-19 MED ORDER — ADULT MULTIVITAMIN W/MINERALS CH
1.0000 | ORAL_TABLET | Freq: Every day | ORAL | Status: DC
  Administered 2021-10-19 – 2021-10-20 (×2): 1 via ORAL
  Filled 2021-10-19 (×2): qty 1

## 2021-10-19 MED ORDER — BUPIVACAINE HCL (PF) 0.25 % IJ SOLN
INTRAMUSCULAR | Status: AC
  Filled 2021-10-19: qty 30

## 2021-10-19 MED ORDER — MENTHOL 3 MG MT LOZG: 1.0000 | LOZENGE | OROMUCOSAL | Status: DC | PRN

## 2021-10-19 MED ORDER — ROCURONIUM BROMIDE 10 MG/ML (PF) SYRINGE
PREFILLED_SYRINGE | INTRAVENOUS | Status: DC | PRN
  Administered 2021-10-19: 50 mg via INTRAVENOUS
  Administered 2021-10-19: 10 mg via INTRAVENOUS

## 2021-10-19 MED ORDER — ONDANSETRON HCL 4 MG/2ML IJ SOLN
4.0000 mg | Freq: Four times a day (QID) | INTRAMUSCULAR | Status: DC | PRN
  Administered 2021-10-19: 4 mg via INTRAVENOUS
  Filled 2021-10-19: qty 2

## 2021-10-19 MED ORDER — OXYCODONE HCL 5 MG PO TABS
5.0000 mg | ORAL_TABLET | ORAL | Status: DC | PRN
  Administered 2021-10-19 – 2021-10-20 (×3): 5 mg via ORAL
  Filled 2021-10-19 (×3): qty 1

## 2021-10-19 MED ORDER — THROMBIN 20000 UNITS EX SOLR
CUTANEOUS | Status: DC | PRN
  Administered 2021-10-19: 20 mL via TOPICAL

## 2021-10-19 MED ORDER — PROPOFOL 10 MG/ML IV BOLUS
INTRAVENOUS | Status: DC | PRN
  Administered 2021-10-19: 130 mg via INTRAVENOUS
  Administered 2021-10-19 (×2): 10 mg via INTRAVENOUS

## 2021-10-19 MED ORDER — ORAL CARE MOUTH RINSE: 15.0000 mL | Freq: Once | OROMUCOSAL | Status: AC

## 2021-10-19 MED ORDER — 0.9 % SODIUM CHLORIDE (POUR BTL) OPTIME
TOPICAL | Status: DC | PRN
  Administered 2021-10-19: 1000 mL

## 2021-10-19 MED ORDER — ACETAMINOPHEN 500 MG PO TABS
1000.0000 mg | ORAL_TABLET | Freq: Once | ORAL | Status: AC
  Administered 2021-10-19: 1000 mg via ORAL
  Filled 2021-10-19: qty 2

## 2021-10-19 MED ORDER — SODIUM CHLORIDE 0.9 % IV SOLN
250.0000 mL | INTRAVENOUS | Status: DC
  Administered 2021-10-19: 250 mL via INTRAVENOUS

## 2021-10-19 MED ORDER — IRBESARTAN 150 MG PO TABS
300.0000 mg | ORAL_TABLET | Freq: Every day | ORAL | Status: DC
  Administered 2021-10-19 – 2021-10-20 (×2): 300 mg via ORAL
  Filled 2021-10-19 (×2): qty 2

## 2021-10-19 MED ORDER — ALLOPURINOL 300 MG PO TABS
150.0000 mg | ORAL_TABLET | Freq: Every day | ORAL | Status: DC
  Administered 2021-10-19 – 2021-10-20 (×2): 150 mg via ORAL
  Filled 2021-10-19 (×2): qty 0.5

## 2021-10-19 MED ORDER — OXYCODONE HCL 5 MG PO TABS: 10.0000 mg | ORAL_TABLET | ORAL | Status: DC | PRN

## 2021-10-19 SURGICAL SUPPLY — 55 items
ADH SKN CLS APL DERMABOND .7 (GAUZE/BANDAGES/DRESSINGS) ×1
APL SKNCLS STERI-STRIP NONHPOA (GAUZE/BANDAGES/DRESSINGS) ×1
BAG COUNTER SPONGE SURGICOUNT (BAG) ×2 IMPLANT
BAG DECANTER FOR FLEXI CONT (MISCELLANEOUS) ×2 IMPLANT
BAG SPNG CNTER NS LX DISP (BAG) ×1
BAND INSRT 18 STRL LF DISP RB (MISCELLANEOUS)
BAND RUBBER #18 3X1/16 STRL (MISCELLANEOUS) ×2 IMPLANT
BENZOIN TINCTURE PRP APPL 2/3 (GAUZE/BANDAGES/DRESSINGS) ×2 IMPLANT
BLADE CLIPPER SURG (BLADE) IMPLANT
BUR CUTTER 7.0 ROUND (BURR) ×2 IMPLANT
CANISTER SUCT 3000ML PPV (MISCELLANEOUS) ×2 IMPLANT
CARTRIDGE OIL MAESTRO DRILL (MISCELLANEOUS) ×1 IMPLANT
CLSR STERI-STRIP ANTIMIC 1/2X4 (GAUZE/BANDAGES/DRESSINGS) ×1 IMPLANT
DECANTER SPIKE VIAL GLASS SM (MISCELLANEOUS) ×1 IMPLANT
DERMABOND ADVANCED (GAUZE/BANDAGES/DRESSINGS) ×1
DERMABOND ADVANCED .7 DNX12 (GAUZE/BANDAGES/DRESSINGS) ×1 IMPLANT
DIFFUSER DRILL AIR PNEUMATIC (MISCELLANEOUS) ×2 IMPLANT
DRAPE HALF SHEET 40X57 (DRAPES) IMPLANT
DRAPE LAPAROTOMY 100X72X124 (DRAPES) ×2 IMPLANT
DRAPE MICROSCOPE LEICA (MISCELLANEOUS) ×1 IMPLANT
DRAPE SURG 17X23 STRL (DRAPES) ×4 IMPLANT
DRSG OPSITE POSTOP 4X8 (GAUZE/BANDAGES/DRESSINGS) ×1 IMPLANT
DURAPREP 26ML APPLICATOR (WOUND CARE) ×2 IMPLANT
ELECT REM PT RETURN 9FT ADLT (ELECTROSURGICAL) ×2
ELECTRODE REM PT RTRN 9FT ADLT (ELECTROSURGICAL) ×1 IMPLANT
GAUZE 4X4 16PLY ~~LOC~~+RFID DBL (SPONGE) ×1 IMPLANT
GAUZE SPONGE 4X4 12PLY STRL (GAUZE/BANDAGES/DRESSINGS) ×1 IMPLANT
GLOVE EXAM NITRILE XL STR (GLOVE) IMPLANT
GLOVE SURG ENC MOIS LTX SZ6.5 (GLOVE) ×2 IMPLANT
GLOVE SURG LTX SZ9 (GLOVE) ×2 IMPLANT
GLOVE SURG UNDER POLY LF SZ6.5 (GLOVE) ×2 IMPLANT
GLOVE SURG UNDER POLY LF SZ7 (GLOVE) ×4 IMPLANT
GOWN STRL REUS W/ TWL LRG LVL3 (GOWN DISPOSABLE) IMPLANT
GOWN STRL REUS W/ TWL XL LVL3 (GOWN DISPOSABLE) ×1 IMPLANT
GOWN STRL REUS W/TWL 2XL LVL3 (GOWN DISPOSABLE) IMPLANT
GOWN STRL REUS W/TWL LRG LVL3 (GOWN DISPOSABLE) ×2
GOWN STRL REUS W/TWL XL LVL3 (GOWN DISPOSABLE) ×4
KIT BASIN OR (CUSTOM PROCEDURE TRAY) ×2 IMPLANT
KIT TURNOVER KIT B (KITS) ×2 IMPLANT
NDL SPNL 22GX3.5 QUINCKE BK (NEEDLE) IMPLANT
NEEDLE HYPO 22GX1.5 SAFETY (NEEDLE) ×2 IMPLANT
NEEDLE SPNL 22GX3.5 QUINCKE BK (NEEDLE) IMPLANT
NS IRRIG 1000ML POUR BTL (IV SOLUTION) ×2 IMPLANT
OIL CARTRIDGE MAESTRO DRILL (MISCELLANEOUS) ×2
PACK LAMINECTOMY NEURO (CUSTOM PROCEDURE TRAY) ×2 IMPLANT
PAD ARMBOARD 7.5X6 YLW CONV (MISCELLANEOUS) ×6 IMPLANT
SPONGE SURGIFOAM ABS GEL 100 (HEMOSTASIS) ×1 IMPLANT
SPONGE SURGIFOAM ABS GEL SZ50 (HEMOSTASIS) ×1 IMPLANT
SPONGE T-LAP 4X18 ~~LOC~~+RFID (SPONGE) ×2 IMPLANT
STRIP CLOSURE SKIN 1/2X4 (GAUZE/BANDAGES/DRESSINGS) ×2 IMPLANT
SUT VIC AB 2-0 CT1 18 (SUTURE) ×3 IMPLANT
SUT VIC AB 3-0 SH 8-18 (SUTURE) ×4 IMPLANT
TOWEL GREEN STERILE (TOWEL DISPOSABLE) ×2 IMPLANT
TOWEL GREEN STERILE FF (TOWEL DISPOSABLE) ×2 IMPLANT
WATER STERILE IRR 1000ML POUR (IV SOLUTION) ×2 IMPLANT

## 2021-10-19 NOTE — Brief Op Note (Signed)
10/19/2021 ? ?11:54 AM ? ?PATIENT:  Angelica Romero  81 y.o. female ? ?PRE-OPERATIVE DIAGNOSIS:  Stenosis ? ?POST-OPERATIVE DIAGNOSIS:  Stenosis ? ?PROCEDURE:  Procedure(s): ?Laminectomy and Foraminotomy - right - Lumbar two-Lumbar three - Lumbar three-Lumbar four - Lumbar four-Lumbar five (Right) ? ?SURGEON:  Surgeon(s) and Role: ?   Earnie Larsson, MD - Primary ? ?PHYSICIAN ASSISTANT:  ? ?ASSISTANTS: Bergman,NP  ? ?ANESTHESIA:   general ? ?EBL:  150 mL  ? ?BLOOD ADMINISTERED:none ? ?DRAINS: none  ? ?LOCAL MEDICATIONS USED:  MARCAINE    ? ?SPECIMEN:  No Specimen ? ?DISPOSITION OF SPECIMEN:  N/A ? ?COUNTS:  YES ? ?TOURNIQUET:  * No tourniquets in log * ? ?DICTATION: .Dragon Dictation ? ?PLAN OF CARE: Admit for overnight observation ? ?PATIENT DISPOSITION:  PACU - hemodynamically stable. ?  ?Delay start of Pharmacological VTE agent (>24hrs) due to surgical blood loss or risk of bleeding: yes ? ?

## 2021-10-19 NOTE — H&P (Signed)
?Angelica Romero is an 81 y.o. female.   ?Chief Complaint: Right leg pain ?HPI: 81 year old female status post remote left-sided L3-4 and L4-5 decompressive surgery presents with severe right lower extremity pain numbness and weakness.  Work-up demonstrates evidence of significant spondylosis and stenosis at L2-3, L4-5 and L3-4 on the right side.  Patient presents now for decompressive surgery. ? ?Past Medical History:  ?Diagnosis Date  ? Arthritis   ? Asthma   ? Complication of anesthesia   ? "hard to wake up" per patient  ? Elevated BP   ? Heart murmur   ? mild-moderate AS 10/15/21  ? Prediabetes   ? Pubic ramus fracture ([place]) 07/17/2021  ? Vitamin D deficiency   ? ? ?Past Surgical History:  ?Procedure Laterality Date  ? APPENDECTOMY    ? FOOT SURGERY    ? Lumbar back surgery    ? TONSILLECTOMY N/A   ? VAGINAL HYSTERECTOMY    ? ? ?Family History  ?Problem Relation Age of Onset  ? Breast cancer Mother   ?     16s  ? Heart attack Mother 2  ? Heart attack Father 53  ?     Sudden  ? ?Social History:  reports that she has never smoked. She has never used smokeless tobacco. She reports that she does not drink alcohol and does not use drugs. ? ?Allergies:  ?Allergies  ?Allergen Reactions  ? Codeine Nausea And Vomiting  ? Hydrocodone Nausea And Vomiting  ? Lisinopril Cough  ? Prilosec [Omeprazole] Nausea And Vomiting  ? Epinephrine Palpitations  ?  Irregular heart rate  ? ? ?Medications Prior to Admission  ?Medication Sig Dispense Refill  ? allopurinol (ZYLOPRIM) 300 MG tablet TAKE 1 TABLET BY MOUTH ONCE DAILY TO  PREVENT  GOUT (Patient taking differently: Take 150 mg by mouth daily.) 90 tablet 3  ? amitriptyline (ELAVIL) 10 MG tablet Take 1-2 tablets (10-20 mg total) by mouth at bedtime as needed for sleep. (Patient taking differently: Take 10 mg by mouth at bedtime as needed for sleep.) 60 tablet 0  ? Ascorbic Acid (VITAMIN C GUMMIE PO) Take 1 each by mouth daily.    ? aspirin EC 81 MG tablet Take 81 mg by mouth daily.  Reported on 07/31/2015    ? Cholecalciferol (VITAMIN D PO) Take 5,000 Units by mouth daily.    ? colchicine 0.6 MG tablet TAKE 1 TABLET BY MOUTH ONCE DAILY AS NEEDED(STARTING WITH ONSET OF GOUT FLARE) 30 tablet 1  ? Multiple Vitamin (MULTIVITAMIN WITH MINERALS) TABS tablet Take 1 tablet by mouth daily. 30 tablet 0  ? telmisartan (MICARDIS) 80 MG tablet Take  1 tablet  Daily  for BP / Patient knows to take by mouth  !   - Thanks (Patient taking differently: Take 80 mg by mouth daily.) 90 tablet 3  ? ? ?No results found for this or any previous visit (from the past 48 hour(s)). ?No results found. ? ?Pertinent items noted in HPI and remainder of comprehensive ROS otherwise negative. ? ?Blood pressure (!) 143/60, pulse 76, temperature 98.2 ?F (36.8 ?C), temperature source Oral, resp. rate 18, height '5\' 1"'$  (1.549 m), weight 73.9 kg, SpO2 99 %. ? ?Patient is awake and alert.  She is oriented and appropriate.  Speech is fluent.  Judgment insight are intact.  Cranial nerve function normal bilateral.  Motor examination reveals mild weakness of her quadriceps muscle on the right side grading out at 4/5.  She has weakness of the  right anterior tibialis grading at 4/5.  She has pain with straight raising.  She has decreased sensation pinprick and light touch in her right L3 and L4 dermatomes.  Deep tendon flexes are normal active scepter right-sided patellar reflexes diminished in her Achilles reflexes are absent bilaterally. ?Assessment/Plan ?Lumbar spondylosis with stenosis and radiculopathy.  Plan right-sided L2-3, L3-4, L4-5 decompressive laminotomies and foraminotomies.  Risks and benefits been explained.  Patient wishes to proceed. ? ?Angelica Romero A Angelica Romero ?10/19/2021, 9:47 AM ? ? ? ?

## 2021-10-19 NOTE — Anesthesia Postprocedure Evaluation (Signed)
Anesthesia Post Note ? ?Patient: Angelica Romero ? ?Procedure(s) Performed: Laminectomy and Foraminotomy - right - Lumbar two-Lumbar three - Lumbar three-Lumbar four - Lumbar four-Lumbar five (Right: Back) ? ?  ? ?Patient location during evaluation: PACU ?Anesthesia Type: General ?Level of consciousness: awake and alert ?Pain management: pain level controlled ?Vital Signs Assessment: post-procedure vital signs reviewed and stable ?Respiratory status: spontaneous breathing, nonlabored ventilation, respiratory function stable and patient connected to nasal cannula oxygen ?Cardiovascular status: blood pressure returned to baseline and stable ?Postop Assessment: no apparent nausea or vomiting ?Anesthetic complications: no ? ? ?No notable events documented. ? ?Last Vitals:  ?Vitals:  ? 10/19/21 1255 10/19/21 1336  ?BP: 122/79 (!) 145/86  ?Pulse: 63 62  ?Resp: 17 18  ?Temp: 36.4 ?C 36.5 ?C  ?SpO2: 99% 98%  ?  ?Last Pain:  ?Vitals:  ? 10/19/21 1255  ?TempSrc:   ?PainSc: 5   ? ? ?  ?  ?  ?  ?  ?  ? ?March Rummage Darely Becknell ? ? ? ? ?

## 2021-10-19 NOTE — Op Note (Signed)
Date of procedure: 10/19/2021 ? ?Date of dictation: Same ? ?Service: Neurosurgery ? ?Preoperative diagnosis: L2-3, L3-4, L4-5 right stenosis with radiculopathy ? ?Postoperative diagnosis: Same ? ?Procedure Name: Right L2-3, L3-4, L4-5 decompressive laminotomies with foraminotomies. ? ?Surgeon:Jalina Blowers A.Quinnlyn Hearns, M.D. ? ?Asst. Surgeon: Reinaldo Meeker, NP ? ?Anesthesia: General ? ?Indication: 81 year old female with severe right lower extremity pain paresthesias and weakness consistent with multiple lower lumbar radiculopathy usually failed conservative management.  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  Patient presents now for multilevel decompressive surgery in hopes of improving her symptoms. ? ?Operative note: After induction of anesthesia, patient position prone on the Wilson frame appropriate padded.  Lumbar region prepped and draped sterilely.  Incision made overlying L3.  Dissection performed on the right.  Retractor placed.  X-ray taken.  Levels were confirmed.  Starting first at L2-3 right-sided decompressive laminotomies and performed using high-speed drill and Kerrison rongeurs to remove the inferior aspect lamina of L2 the medial aspect the L2-3 facet joint and the superior rim of the L3 lamina.  Ligament flavum elevated and resected.  The decompression was carried across midline in a sublaminar fashion and the left lateral recess is also decompressed.  The bulky lumbar epidural fat collection was resected.  At this point a very thorough decompression of been achieved at L to 3.  Dissection is then redirected L3-4 on the right.  Decompressive laminotomy and foraminotomies then performed using high-speed drill and Kerrison rongeurs to remove the inferior aspect of the lamina of L3 the medial aspect the L3-4 facet joint and the superior aspect of the L4 lamina.  Ligament flavum elevated  and resected.  Foraminotomies completed on the course exiting L3 and L4 nerve roots the right side.  No evidence of injury to thecal sac and nerve roots.  Procedures then repeated at L4-5 in a similar fashion.  Wound was then irrigated.  Gelfoam was placed topically for hemostasis.  Wounds then closed in layers with Vicryl sutures.  Steri-Strips and sterile dressing were applied.  No apparent complications.  Patient tolerated the procedure well and she returns to the recovery room postop. ? ?

## 2021-10-19 NOTE — Anesthesia Procedure Notes (Signed)
Procedure Name: Intubation ?Date/Time: 10/19/2021 9:57 AM ?Performed by: Wilburn Cornelia, CRNA ?Pre-anesthesia Checklist: Patient identified, Emergency Drugs available, Suction available, Patient being monitored and Timeout performed ?Patient Re-evaluated:Patient Re-evaluated prior to induction ?Oxygen Delivery Method: Circle system utilized ?Preoxygenation: Pre-oxygenation with 100% oxygen ?Induction Type: IV induction ?Ventilation: Mask ventilation without difficulty ?Laryngoscope Size: Mac and 3 ?Grade View: Grade I ?Tube type: Oral ?Tube size: 7.0 mm ?Number of attempts: 1 ?Airway Equipment and Method: Stylet ?Placement Confirmation: positive ETCO2, CO2 detector, breath sounds checked- equal and bilateral and ETT inserted through vocal cords under direct vision ?Secured at: 21 cm ?Tube secured with: Tape ?Dental Injury: Teeth and Oropharynx as per pre-operative assessment  ? ? ? ? ?

## 2021-10-19 NOTE — Transfer of Care (Signed)
Immediate Anesthesia Transfer of Care Note ? ?Patient: Angelica Romero ? ?Procedure(s) Performed: Laminectomy and Foraminotomy - right - Lumbar two-Lumbar three - Lumbar three-Lumbar four - Lumbar four-Lumbar five (Right: Back) ? ?Patient Location: PACU ? ?Anesthesia Type:General ? ?Level of Consciousness: awake, alert  and oriented ? ?Airway & Oxygen Therapy: Patient Spontanous Breathing and Patient connected to nasal cannula oxygen ? ?Post-op Assessment: Report given to RN and Post -op Vital signs reviewed and stable ? ?Post vital signs: Reviewed and stable ? ?Last Vitals:  ?Vitals Value Taken Time  ?BP 137/87 10/19/21 1210  ?Temp    ?Pulse 77 10/19/21 1213  ?Resp 10 10/19/21 1213  ?SpO2 98 % 10/19/21 1213  ?Vitals shown include unvalidated device data. ? ?Last Pain:  ?Vitals:  ? 10/19/21 0800  ?TempSrc:   ?PainSc: 8   ?   ? ?Patients Stated Pain Goal: 5 (10/19/21 0800) ? ?Complications: No notable events documented. ?

## 2021-10-20 ENCOUNTER — Encounter (HOSPITAL_COMMUNITY): Payer: Self-pay | Admitting: Neurosurgery

## 2021-10-20 DIAGNOSIS — M5416 Radiculopathy, lumbar region: Secondary | ICD-10-CM | POA: Diagnosis not present

## 2021-10-20 DIAGNOSIS — J45909 Unspecified asthma, uncomplicated: Secondary | ICD-10-CM | POA: Diagnosis not present

## 2021-10-20 DIAGNOSIS — M48061 Spinal stenosis, lumbar region without neurogenic claudication: Secondary | ICD-10-CM | POA: Diagnosis not present

## 2021-10-20 MED ORDER — DIPHENHYDRAMINE HCL 25 MG PO CAPS
25.0000 mg | ORAL_CAPSULE | Freq: Three times a day (TID) | ORAL | Status: DC | PRN
  Administered 2021-10-20: 25 mg via ORAL
  Filled 2021-10-20: qty 1

## 2021-10-20 MED ORDER — OXYCODONE HCL 5 MG PO TABS: 5.0000 mg | ORAL_TABLET | ORAL | 0 refills | Status: DC | PRN

## 2021-10-20 MED ORDER — ACETAMINOPHEN 325 MG PO TABS: 650.0000 mg | ORAL_TABLET | ORAL | Status: DC | PRN

## 2021-10-20 NOTE — Progress Notes (Signed)
Patient alert and oriented, mae's well, voiding adequate amount of urine, swallowing without difficulty, no c/o pain at time of discharge. Patient discharged home with daughter. Script and discharged instructions given to patient. Patient and family stated understanding of instructions given. Patient has an appointment with Dr. Annette Stable in 2 weeks ?

## 2021-10-20 NOTE — Evaluation (Signed)
Physical Therapy Evaluation ?Patient Details ?Name: Angelica Romero ?MRN: [id number] ?DOB: 08/24/1940 ?Today's Date: 10/20/2021 ? ?History of Present Illness ? Angelica Romero  81 y.o. female who underwent Laminectomy and Foraminotomy L2-4. PMHx: arthritis, elevated BP, prediabetes, pubic ramus fracture 12/21 ?  ?Clinical Impression ? Pt admitted with above diagnosis. At the time of PT eval, pt was able to demonstrate transfers and ambulation with gross supervision for safety and RW for support. Pt was educated on precautions, positioning recommendations, appropriate activity progression, and car transfer. Pt currently with functional limitations due to the deficits listed below (see PT Problem List). Pt will benefit from skilled PT to increase their independence and safety with mobility to allow discharge to the venue listed below.     ?   ? ?Recommendations for follow up therapy are one component of a multi-disciplinary discharge planning process, led by the attending physician.  Recommendations may be updated based on patient status, additional functional criteria and insurance authorization. ? ?Follow Up Recommendations No PT follow up ? ?  ?Assistance Recommended at Discharge PRN  ?Patient can return home with the following ? A little help with walking and/or transfers;Assist for transportation;Help with stairs or ramp for entrance ? ?  ?Equipment Recommendations None recommended by PT  ?Recommendations for Other Services ?    ?  ?Functional Status Assessment Patient has had a recent decline in their functional status and demonstrates the ability to make significant improvements in function in a reasonable and predictable amount of time.  ? ?  ?Precautions / Restrictions Precautions ?Precautions: Fall;Back ?Precaution Booklet Issued: Yes (comment) ?Precaution Comments: Reviewed handout and pt was cued for precautions during functional mobility. ?Restrictions ?Weight Bearing Restrictions: No  ? ?  ? ?Mobility ? Bed  Mobility ?Overal bed mobility: Needs Assistance ?Bed Mobility: Sit to Sidelying, Rolling ?Rolling: Supervision ?  ?  ?  ?Sit to sidelying: Supervision ?General bed mobility comments: VC's for optimal log roll technique. ?  ? ?Transfers ?Overall transfer level: Needs assistance ?Equipment used: Rolling walker (2 wheels) ?Transfers: Sit to/from Stand ?Sit to Stand: Supervision ?  ?  ?  ?  ?  ?General transfer comment: VC's for improved posture with power up to full stand. No assist required. ?  ? ?Ambulation/Gait ?Ambulation/Gait assistance: Supervision ?Gait Distance (Feet): 450 Feet ?Assistive device: Rolling walker (2 wheels) ?Gait Pattern/deviations: Step-through pattern, Decreased stride length, Trunk flexed ?Gait velocity: Decreased ?Gait velocity interpretation: <1.31 ft/sec, indicative of household ambulator ?  ?General Gait Details: VC's for improved posture, closer walker proximity, and forward gaze. No assist required and no overt LOB noted. ? ?Stairs ?  ?  ?  ?  ?  ? ?Wheelchair Mobility ?  ? ?Modified Rankin (Stroke Patients Only) ?  ? ?  ? ?Balance Overall balance assessment: Needs assistance ?Sitting-balance support: Feet supported ?Sitting balance-Leahy Scale: Good ?  ?  ?Standing balance support: No upper extremity supported, During functional activity ?Standing balance-Leahy Scale: Fair ?  ?  ?  ?  ?  ?  ?  ?  ?  ?  ?  ?  ?   ? ? ? ?Pertinent Vitals/Pain Pain Assessment ?Pain Assessment: Faces ?Faces Pain Scale: Hurts a little bit ?Pain Location: back ?Pain Descriptors / Indicators: Discomfort, Sore ?Pain Intervention(s): Limited activity within patient's tolerance, Monitored during session, Repositioned  ? ? ?Home Living Family/patient expects to be discharged to:: Private residence ?Living Arrangements: Alone ?Available Help at Discharge: Neighbor;Family;Available PRN/intermittently ?Type of Home: House ?Home Access:  Stairs to enter ?Entrance Stairs-Rails: Can reach both ?Entrance Stairs-Number of  Steps: 2 in front (1, then 1 later before stepping in front door), 5 in back ?  ?Home Layout: One level ?Home Equipment: Conservation officer, nature (2 wheels);Rollator (4 wheels);Cane - single point;Shower seat;Adaptive equipment;Grab bars - toilet;Grab bars - tub/shower (lift chiar) ?Additional Comments: daughter lives near by and can assist as needed  ?  ?Prior Function Prior Level of Function : Independent/Modified Independent ?  ?  ?  ?  ?  ?  ?Mobility Comments: no AD ?ADLs Comments: indep, drives, takes care of 3 kittens and small dog ?  ? ? ?Hand Dominance  ?   ? ?  ?Extremity/Trunk Assessment  ? Upper Extremity Assessment ?Upper Extremity Assessment: Defer to OT evaluation ?  ? ?Lower Extremity Assessment ?Lower Extremity Assessment: Generalized weakness;LLE deficits/detail ?LLE Deficits / Details: Mild weakness s/p recent pubic rami fracture in December. ?  ? ?Cervical / Trunk Assessment ?Cervical / Trunk Assessment: Back Surgery  ?Communication  ? Communication: No difficulties  ?Cognition Arousal/Alertness: Awake/alert ?Behavior During Therapy: [facility] for tasks assessed/performed ?Overall Cognitive Status: No family/caregiver present to determine baseline cognitive functioning ?  ?  ?  ?  ?  ?  ?  ?  ?  ?  ?  ?  ?  ?  ?  ?  ?  ?  ?  ? ?  ?General Comments General comments (skin integrity, edema, etc.): VSS on RA ? ?  ?Exercises    ? ?Assessment/Plan  ?  ?PT Assessment Patient needs continued PT services  ?PT Problem List Decreased strength;Decreased activity tolerance;Decreased balance;Decreased mobility;Decreased knowledge of use of DME;Decreased safety awareness;Decreased knowledge of precautions;Pain ? ?   ?  ?PT Treatment Interventions DME instruction;Gait training;Functional mobility training;Therapeutic activities;Therapeutic exercise;Balance training;Patient/family education   ? ?PT Goals (Current goals can be found in the Care Plan section)  ?Acute Rehab PT Goals ?Patient Stated Goal: Home today ?PT Goal  Formulation: With patient ?Time For Goal Achievement: 10/27/21 ?Potential to Achieve Goals: Good ? ?  ?Frequency Min 5X/week ?  ? ? ?Co-evaluation   ?  ?  ?  ?  ? ? ?  ?AM-PAC PT "6 Clicks" Mobility  ?Outcome Measure Help needed turning from your back to your side while in a flat bed without using bedrails?: None ?Help needed moving from lying on your back to sitting on the side of a flat bed without using bedrails?: A Little ?Help needed moving to and from a bed to a chair (including a wheelchair)?: A Little ?Help needed standing up from a chair using your arms (e.g., wheelchair or bedside chair)?: A Little ?Help needed to walk in hospital room?: A Little ?Help needed climbing 3-5 steps with a railing? : A Little ?6 Click Score: 19 ? ?  ?End of Session Equipment Utilized During Treatment: Gait belt ?Activity Tolerance: Patient tolerated treatment well ?Patient left: in bed;with call bell/phone within reach ?Nurse Communication: Mobility status ?PT Visit Diagnosis: Unsteadiness on feet (R26.81);Pain ?Pain - part of body:  (back) ?  ? ?Time: 5638-7564 ?PT Time Calculation (min) (ACUTE ONLY): 18 min ? ? ?Charges:   PT Evaluation ?$PT Eval Low Complexity: 1 Low ?  ?  ?   ? ? ?Rolinda Roan, PT, DPT ?Acute Rehabilitation Services ?Pager: [phone] ?Office: [phone]  ? ?Thelma Comp ?10/20/2021, 9:56 AM ? ?

## 2021-10-20 NOTE — Discharge Summary (Signed)
Physician Discharge Summary  ?Patient ID: ?Angelica Romero ?MRN: [id number] ?DOB/AGE: Dec 26, 1940 81 y.o. ? ?Admit date: 10/19/2021 ?Discharge date: 10/20/2021 ? ?Admission Diagnoses: ? ?Discharge Diagnoses:  ?Principal Problem: ?  Lumbar radiculopathy ? ? ?Discharged Condition: good ? ?Hospital Course: Patient admitted to the hospital where she underwent uncomplicated right-sided lumbar decompressive surgery.  Postoperatively doing very well.  Preoperative back and right lower extremity pain resolved.  Patient standing ambulating and voiding without difficulty.  Feels much improved and ready for discharge home ? ?Consults:  ? ?Significant Diagnostic Studies:  ? ?Treatments:  ? ?Discharge Exam: ?Blood pressure (!) 157/65, pulse 75, temperature 98 ?F (36.7 ?C), temperature source Oral, resp. rate 18, height '5\' 1"'$  (1.549 m), weight 73.9 kg, SpO2 95 %. ?Awake and alert.  Oriented and appropriate.  Motor and sensory function intact.  Wound clean and dry.  Chest and abdomen benign. ? ?Disposition: Discharge disposition: 01-Home or Self Care ? ? ? ? ? ? ? ?Allergies as of 10/20/2021   ? ?   Reactions  ? Codeine Nausea And Vomiting  ? Hydrocodone Nausea And Vomiting  ? Lisinopril Cough  ? Prilosec [omeprazole] Nausea And Vomiting  ? Epinephrine Palpitations  ? Irregular heart rate  ? ?  ? ?  ?Medication List  ?  ? ?TAKE these medications   ? ?allopurinol 300 MG tablet ?Commonly known as: ZYLOPRIM ?TAKE 1 TABLET BY MOUTH ONCE DAILY TO  PREVENT  GOUT ?What changed:  ?how much to take ?how to take this ?when to take this ?additional instructions ?  ?amitriptyline 10 MG tablet ?Commonly known as: ELAVIL ?Take 1-2 tablets (10-20 mg total) by mouth at bedtime as needed for sleep. ?What changed: how much to take ?  ?aspirin EC 81 MG tablet ?Take 81 mg by mouth daily. Reported on 07/31/2015 ?  ?colchicine 0.6 MG tablet ?TAKE 1 TABLET BY MOUTH ONCE DAILY AS NEEDED(STARTING WITH ONSET OF GOUT FLARE) ?  ?multivitamin with minerals Tabs  tablet ?Take 1 tablet by mouth daily. ?  ?oxyCODONE 5 MG immediate release tablet ?Commonly known as: Oxy IR/ROXICODONE ?Take 1 tablet (5 mg total) by mouth every 3 (three) hours as needed for moderate pain ((score 4 to 6)). ?  ?telmisartan 80 MG tablet ?Commonly known as: MICARDIS ?Take  1 tablet  Daily  for BP / Patient knows to take by mouth  !   - Thanks ?What changed:  ?how much to take ?how to take this ?when to take this ?additional instructions ?  ?VITAMIN C GUMMIE PO ?Take 1 each by mouth daily. ?  ?VITAMIN D PO ?Take 5,000 Units by mouth daily. ?  ? ?  ? ? ? ?Signed: ?Cooper Render Angelica Romero ?10/20/2021, 8:43 AM ? ? ?

## 2021-10-20 NOTE — Evaluation (Signed)
Occupational Therapy Evaluation ?Patient Details ?Name: Angelica Romero ?MRN: [id number] ?DOB: 1941/05/10 ?Today's Date: 10/20/2021 ? ? ?History of Present Illness Angelica Romero  81 y.o. female who underwent Laminectomy and Foraminotomy L2-4. PMHx: arthritis, elevated BP, prediabetes, pubic ramus fracture 12/21  ? ?Clinical Impression ?  ?Angelica Romero was evaluated s/p the above back sx, she is indep at baseline and lives alone in a 1 level home. She has 4 small pets to take care of. Pt recalled good understanding of back precautions but required cues to maintain throughout functional tasks. She was min guard for functional mobility with RW. Overall she is limited by back pain and knowledge of precautions. Reviewed recommendations for pet care and feeding them without bending, pt verbalized understanding. Recommend d/c home without follow up OT.  ?   ? ?Recommendations for follow up therapy are one component of a multi-disciplinary discharge planning process, led by the attending physician.  Recommendations may be updated based on patient status, additional functional criteria and insurance authorization.  ? ?Follow Up Recommendations ? No OT follow up  ?  ?Assistance Recommended at Discharge Intermittent Supervision/Assistance  ?Patient can return home with the following A little help with walking and/or transfers;A little help with bathing/dressing/bathroom;Assistance with cooking/housework;Assist for transportation ? ?  ?Functional Status Assessment ? Patient has had a recent decline in their functional status and demonstrates the ability to make significant improvements in function in a reasonable and predictable amount of time.  ?Equipment Recommendations ? None recommended by OT  ?  ?Recommendations for Other Services   ? ? ?  ?Precautions / Restrictions Precautions ?Precautions: Fall;Back ?Precaution Booklet Issued: Yes (comment) ?Restrictions ?Weight Bearing Restrictions: No  ? ?  ? ?Mobility Bed Mobility ?Overal bed  mobility: Needs Assistance ?Bed Mobility: Sit to Sidelying, Rolling ?Rolling: Min guard ?  ?  ?  ?Sit to sidelying: Min guard ?General bed mobility comments: cues for log roll ?  ? ?Transfers ?Overall transfer level: Needs assistance ?Equipment used: Rolling walker (2 wheels) ?Transfers: Sit to/from Stand ?Sit to Stand: Min guard ?  ?  ?  ?  ?  ?  ?  ? ?  ?Balance Overall balance assessment: Needs assistance ?Sitting-balance support: Feet supported ?Sitting balance-Leahy Scale: Good ?  ?  ?Standing balance support: No upper extremity supported, During functional activity ?Standing balance-Leahy Scale: Fair ?  ?  ?  ?  ?  ?  ?  ?  ?  ?  ?  ?  ?   ? ?ADL either performed or assessed with clinical judgement  ? ?ADL Overall ADL's : Needs assistance/impaired ?Eating/Feeding: Independent;Sitting ?  ?Grooming: Min guard;Standing ?  ?Upper Body Bathing: Minimal assistance;Sitting ?  ?Lower Body Bathing: Moderate assistance;Sit to/from stand ?  ?Upper Body Dressing : Set up;Sitting ?  ?Lower Body Dressing: Moderate assistance;Sit to/from stand ?  ?Toilet Transfer: Min guard;Ambulation;Rolling walker (2 wheels) ?  ?Toileting- Clothing Manipulation and Hygiene: Min guard;Sitting/lateral lean ?  ?  ?  ?Functional mobility during ADLs: Min guard;Rolling walker (2 wheels) ?General ADL Comments: assist to maintain back precations, required multiple cues throughout  ? ? ? ?Vision Baseline Vision/History: 0 No visual deficits ?Ability to See in Adequate Light: 0 Adequate ?Vision Assessment?: No apparent visual deficits  ?   ?Perception   ?  ?Praxis   ?  ? ?Pertinent Vitals/Pain Pain Assessment ?Pain Assessment: Faces ?Faces Pain Scale: Hurts a little bit ?Pain Location: back ?Pain Descriptors / Indicators: Discomfort, Sore ?Pain Intervention(s): Limited activity  within patient's tolerance, Monitored during session  ? ? ? ?Hand Dominance   ?  ?Extremity/Trunk Assessment Upper Extremity Assessment ?Upper Extremity Assessment:  Generalized weakness ?  ?Lower Extremity Assessment ?Lower Extremity Assessment: Defer to PT evaluation ?  ?Cervical / Trunk Assessment ?Cervical / Trunk Assessment: Back Surgery ?  ?Communication Communication ?Communication: No difficulties ?  ?Cognition Arousal/Alertness: Awake/alert ?Behavior During Therapy: [facility] for tasks assessed/performed ?Overall Cognitive Status: No family/caregiver present to determine baseline cognitive functioning ?  ?  ?  ?  ?  ?  ?  ?  ?  ?  ?  ?  ?  ?  ?  ?  ?General Comments: pt had good recall of back precautions but required cues to maintain during functional tasks. Pt perseverating on her recent broken pelvis ?  ?  ?General Comments  VSS on RA ? ?  ?Exercises   ?  ?Shoulder Instructions    ? ? ?Home Living Family/patient expects to be discharged to:: Private residence ?Living Arrangements: Alone ?Available Help at Discharge: Neighbor;Family;Available PRN/intermittently ?Type of Home: House ?Home Access: Stairs to enter ?Entrance Stairs-Number of Steps: 2 in front, 5 in back ?Entrance Stairs-Rails: Can reach both ?Home Layout: One level ?  ?  ?  ?  ?Bathroom Toilet: Handicapped height ?  ?  ?Home Equipment: Conservation officer, nature (2 wheels);Rollator (4 wheels);Cane - single point;Shower seat;Adaptive equipment;Grab bars - toilet;Grab bars - tub/shower (lift chiar) ?Adaptive Equipment: Reacher;Sock aid;Long-handled shoe horn ?Additional Comments: daughter lives near by and can assist as needed ?  ? ?  ?Prior Functioning/Environment Prior Level of Function : Independent/Modified Independent ?  ?  ?  ?  ?  ?  ?Mobility Comments: no AD ?ADLs Comments: indep, drives, takes care of 3 kittens and small dog ?  ? ?  ?  ?OT Problem List: Decreased strength;Decreased range of motion;Decreased activity tolerance;Impaired balance (sitting and/or standing);Decreased safety awareness;Decreased knowledge of use of DME or AE;Decreased knowledge of precautions ?  ?   ?OT Treatment/Interventions:    ?  ?OT  Goals(Current goals can be found in the care plan section) Acute Rehab OT Goals ?Patient Stated Goal: home ?OT Goal Formulation: With patient ?Time For Goal Achievement: 11/03/21 ?Potential to Achieve Goals: Good  ?OT Frequency:   ?  ? ?Co-evaluation   ?  ?  ?  ?  ? ?  ?AM-PAC OT "6 Clicks" Daily Activity     ?Outcome Measure Help from another person eating meals?: None ?Help from another person taking care of personal grooming?: A Little ?Help from another person toileting, which includes using toliet, bedpan, or urinal?: A Little ?Help from another person bathing (including washing, rinsing, drying)?: A Little ?Help from another person to put on and taking off regular upper body clothing?: None ?Help from another person to put on and taking off regular lower body clothing?: A Lot ?6 Click Score: 19 ?  ?End of Session Equipment Utilized During Treatment: Rolling walker (2 wheels) ?Nurse Communication: Mobility status ? ?Activity Tolerance: Patient tolerated treatment well ?Patient left: in bed;with call bell/phone within reach ? ?OT Visit Diagnosis: Unsteadiness on feet (R26.81);Other abnormalities of gait and mobility (R26.89);Muscle weakness (generalized) (M62.81)  ?              ?Time: [phone] ?OT Time Calculation (min): 29 min ?Charges:  OT General Charges ?$OT Visit: 1 Visit ?OT Evaluation ?$OT Eval Low Complexity: 1 Low ?OT Treatments ?$Self Care/Home Management : 8-22 mins ? ? ?Asiyah Pineau A Teela Narducci ?10/20/2021,  9:08 AM ?

## 2021-10-20 NOTE — Discharge Instructions (Addendum)
Wound Care Keep incision covered and dry for three days.   Do not put any creams, lotions, or ointments on incision. Leave steri-strips on back.  They will fall off by themselves. Activity Walk each and every day, increasing distance each day. No lifting greater than 5 lbs.  Avoid excessive neck motion. No driving for 2 weeks; may ride as a passenger locally. Diet Resume your normal diet.   Call Your Doctor If Any of These Occur Redness, drainage, or swelling at the wound.  Temperature greater than 101 degrees. Severe pain not relieved by pain medication. Incision starts to come apart. Follow Up Appt Call today for appointment in 1-2 weeks ([phone]) or for problems.  If you have any hardware placed in your spine, you will need an x-ray before your appointment.  

## 2021-10-22 ENCOUNTER — Other Ambulatory Visit (HOSPITAL_COMMUNITY): Payer: Medicare Other

## 2021-10-28 ENCOUNTER — Telehealth: Payer: Self-pay | Admitting: Adult Health

## 2021-10-28 NOTE — Telephone Encounter (Signed)
Pharmacy called saying they faxed [place] and never received a response. Requesting refills on Colchicine, Allopurinol, and Telmisartan. To go to [facility] ?

## 2021-10-29 ENCOUNTER — Other Ambulatory Visit: Payer: Self-pay | Admitting: Adult Health

## 2021-10-29 DIAGNOSIS — M1000 Idiopathic gout, unspecified site: Secondary | ICD-10-CM

## 2021-10-29 DIAGNOSIS — E790 Hyperuricemia without signs of inflammatory arthritis and tophaceous disease: Secondary | ICD-10-CM

## 2021-10-29 MED ORDER — COLCHICINE 0.6 MG PO TABS: ORAL_TABLET | ORAL | 1 refills | Status: DC

## 2021-10-29 MED ORDER — TELMISARTAN 80 MG PO TABS: ORAL_TABLET | ORAL | 3 refills | Status: DC

## 2021-10-29 MED ORDER — ALLOPURINOL 300 MG PO TABS
ORAL_TABLET | ORAL | 3 refills | Status: DC
Start: 2021-10-29 — End: 2022-02-06

## 2021-11-11 ENCOUNTER — Other Ambulatory Visit (HOSPITAL_COMMUNITY): Payer: Medicare Other

## 2022-01-20 ENCOUNTER — Encounter: Payer: Self-pay | Admitting: Internal Medicine

## 2022-01-20 ENCOUNTER — Ambulatory Visit (INDEPENDENT_AMBULATORY_CARE_PROVIDER_SITE_OTHER): Payer: Medicare Other | Admitting: Internal Medicine

## 2022-01-20 VITALS — BP 118/75 | HR 79 | Temp 96.9°F | Resp 16 | Ht 62.0 in | Wt 166.4 lb

## 2022-01-20 DIAGNOSIS — Z79899 Other long term (current) drug therapy: Secondary | ICD-10-CM | POA: Diagnosis not present

## 2022-01-20 DIAGNOSIS — R0989 Other specified symptoms and signs involving the circulatory and respiratory systems: Secondary | ICD-10-CM

## 2022-01-20 DIAGNOSIS — E559 Vitamin D deficiency, unspecified: Secondary | ICD-10-CM | POA: Diagnosis not present

## 2022-01-20 DIAGNOSIS — Z0000 Encounter for general adult medical examination without abnormal findings: Secondary | ICD-10-CM

## 2022-01-20 DIAGNOSIS — Z136 Encounter for screening for cardiovascular disorders: Secondary | ICD-10-CM

## 2022-01-20 DIAGNOSIS — R7309 Other abnormal glucose: Secondary | ICD-10-CM

## 2022-01-20 DIAGNOSIS — E782 Mixed hyperlipidemia: Secondary | ICD-10-CM | POA: Diagnosis not present

## 2022-01-20 DIAGNOSIS — Z1211 Encounter for screening for malignant neoplasm of colon: Secondary | ICD-10-CM

## 2022-01-20 DIAGNOSIS — Z0001 Encounter for general adult medical examination with abnormal findings: Secondary | ICD-10-CM

## 2022-01-20 DIAGNOSIS — M1A00X0 Idiopathic chronic gout, unspecified site, without tophus (tophi): Secondary | ICD-10-CM

## 2022-01-20 NOTE — Patient Instructions (Signed)
Due to recent changes in healthcare laws, you Dudash see the results of your imaging and laboratory studies on MyChart before your provider has had a chance to review them.  We understand that in some cases there Gin be results that are confusing or concerning to you. Not all laboratory results come back in the same time frame and the provider Hoback be waiting for multiple results in order to interpret others.  Please give [place] 48 hours in order for your provider to thoroughly review all the results before contacting the office for clarification of your results.   +++++++++++++++++++++++++  Vit D  & Vit C 1,000 mg   are recommended to help protect  against the Covid-19 and other Corona viruses.    Also it's recommended  to take  Zinc 50 mg  to help  protect against the Covid-19   and best place to get  is also on [email or web address]  and don't pay more than 6-8 cents /pill !  ================================ Coronavirus (COVID-19) Are you at risk?  Are you at risk for the Coronavirus (COVID-19)?  To be considered HIGH RISK for Coronavirus (COVID-19), you have to meet the following criteria:  Traveled to [place], [place], [place], [place] or [place]; or in the [place] to [place], [place], [place]  or [place]; and have fever, cough, and shortness of breath within the last 2 weeks of travel OR Been in close contact with a person diagnosed with COVID-19 within the last 2 weeks and have  fever, cough,and shortness of breath  IF YOU DO NOT MEET THESE CRITERIA, YOU ARE CONSIDERED LOW RISK FOR COVID-19.  What to do if you are HIGH RISK for COVID-19?  If you are having a medical emergency, call 911. Seek medical care right away. Before you go to a doctor's office, urgent care or emergency department,  call ahead and tell them about your recent travel, contact with someone diagnosed with COVID-19   and your symptoms.  You should receive instructions from your physician's office regarding next  steps of care.  When you arrive at healthcare provider, tell the healthcare staff immediately you have returned from  visiting [place], [place], [place], [place] or [place]; or traveled in the [place] to [place], [place],  [place] or [place] in the last two weeks or you have been in close contact with a person diagnosed with  COVID-19 in the last 2 weeks.   Tell the health care staff about your symptoms: fever, cough and shortness of breath. After you have been seen by a medical provider, you will be either: Tested for (COVID-19) and discharged home on quarantine except to seek medical care if  symptoms worsen, and asked to  Stay home and avoid contact with others until you get your results (4-5 days)  Avoid travel on public transportation if possible (such as bus, train, or airplane) or Sent to the Emergency Department by EMS for evaluation, COVID-19 testing  and  possible admission depending on your condition and test results.  What to do if you are LOW RISK for COVID-19?  Reduce your risk of any infection by using the same precautions used for avoiding the common cold or flu:  Wash your hands often with soap and warm water for at least 20 seconds.  If soap and water are not readily available,  use an alcohol-based hand sanitizer with at least 60% alcohol.  If coughing or sneezing, cover your mouth and nose by coughing  or sneezing into the elbow areas of your shirt or coat,  into a tissue or into your sleeve (not your hands). Avoid shaking hands with others and consider head nods or verbal greetings only. Avoid touching your eyes, nose, or mouth with unwashed hands.  Avoid close contact with people who are sick. Avoid places or events with large numbers of people in one location, like concerts or sporting events. Carefully consider travel plans you have or are making. If you are planning any travel outside or inside the [place], visit the CDC's Travelers' Health webpage for the  latest health notices. If you have some symptoms but not all symptoms, continue to monitor at home and seek medical attention  if your symptoms worsen. If you are having a medical emergency, call 911.   >>>>>>>>>>>>>>>>>>>>>>>>>>>>>>>>>  We Do NOT Approve of [employer] > > > > > > > > > > > > > > > > > > > > > > > > > > > > > > > > > > > > > > >  Preventive Care for Adults  A healthy lifestyle and preventive care can promote health and wellness. Preventive health guidelines for women include the following key practices. A routine yearly physical is a good way to check with your health care provider about your health and preventive screening. It is a chance to share any concerns and updates on your health and to receive a thorough exam. Visit your dentist for a routine exam and preventive care every 6 months. Brush your teeth twice a day and floss once a day. Good oral hygiene prevents tooth decay and gum disease. The frequency of eye exams is based on your age, health, family medical history, use of contact lenses, and other factors. Follow your health care provider's recommendations for frequency of eye exams. Eat a healthy diet. Foods like vegetables, fruits, whole grains, low-fat dairy products, and lean protein foods contain the nutrients you need without too many calories. Decrease your intake of foods high in solid fats, added sugars, and salt. Eat the right amount of calories for you. Get information about a proper diet from your health care provider, if necessary. Regular physical exercise is one of the most important things you can do for your health. Most adults should get at least 150 minutes of moderate-intensity exercise (any activity that increases your heart rate and causes you to sweat) each week. In addition, most adults need muscle-strengthening exercises on 2 or more days a week. Maintain a healthy weight. The body mass index (BMI) is a screening tool to identify  possible weight problems. It provides an estimate of body fat based on height and weight. Your health care provider can find your BMI and can help you achieve or maintain a healthy weight. For adults 20 years and older: A BMI below 18.5 is considered underweight. A BMI of 18.5 to 24.9 is normal. A BMI of 25 to 29.9 is considered overweight. A BMI of 30 and above is considered obese. Maintain normal blood lipids and cholesterol levels by exercising and minimizing your intake of saturated fat. Eat a balanced diet with plenty of fruit and vegetables. If your lipid or cholesterol levels are high, you are over 50, or you are at high risk for heart disease, you may need your cholesterol levels checked more frequently. Ongoing high lipid and cholesterol levels should be treated with medicines if diet and exercise are not working. If you smoke, find out from  your health care provider how to quit. If you do not use tobacco, do not start. Lung cancer screening is recommended for adults aged 55-80 years who are at high risk for developing lung cancer because of a history of smoking. A yearly low-dose CT scan of the lungs is recommended for people who have at least a 30-pack-year history of smoking and are a current smoker or have quit within the past 15 years. A pack year of smoking is smoking an average of 1 pack of cigarettes a day for 1 year (for example: 1 pack a day for 30 years or 2 packs a day for 15 years). Yearly screening should continue until the smoker has stopped smoking for at least 15 years. Yearly screening should be stopped for people who develop a health problem that would prevent them from having lung cancer treatment. Avoid use of street drugs. Do not share needles with anyone. Ask for help if you need support or instructions about stopping the use of drugs. High blood pressure causes heart disease and increases the risk of stroke.  Ongoing high blood pressure should be treated with medicines if  weight loss and exercise do not work. If you are 55-79 years old, ask your health care provider if you should take aspirin to prevent strokes. Diabetes screening involves taking a blood sample to check your fasting blood sugar level. This should be done once every 3 years, after age 45, if you are within normal weight and without risk factors for diabetes. Testing should be considered at a younger age or be carried out more frequently if you are overweight and have at least 1 risk factor for diabetes. Breast cancer screening is essential preventive care for women. You should practice "breast self-awareness." This means understanding the normal appearance and feel of your breasts and may include breast self-examination. Any changes detected, no matter how small, should be reported to a health care provider. Women in their 20s and 30s should have a clinical breast exam (CBE) by a health care provider as part of a regular health exam every 1 to 3 years. After age 40, women should have a CBE every year. Starting at age 40, women should consider having a mammogram (breast X-ray test) every year. Women who have a family history of breast cancer should talk to their health care provider about genetic screening. Women at a high risk of breast cancer should talk to their health care providers about having an MRI and a mammogram every year. Breast cancer gene (BRCA)-related cancer risk assessment is recommended for women who have family members with BRCA-related cancers. BRCA-related cancers include breast, ovarian, tubal, and peritoneal cancers. Having family members with these cancers may be associated with an increased risk for harmful changes (mutations) in the breast cancer genes BRCA1 and BRCA2. Results of the assessment will determine the need for genetic counseling and BRCA1 and BRCA2 testing. Routine pelvic exams to screen for cancer are no longer recommended for nonpregnant women who are considered low risk for  cancer of the pelvic organs (ovaries, uterus, and vagina) and who do not have symptoms. Ask your health care provider if a screening pelvic exam is right for you. If you have had past treatment for cervical cancer or a condition that could lead to cancer, you need Pap tests and screening for cancer for at least 20 years after your treatment. If Pap tests have been discontinued, your risk factors (such as having a new sexual partner) need to be   reassessed to determine if screening should be resumed. Some women have medical problems that increase the chance of getting cervical cancer. In these cases, your health care provider may recommend more frequent screening and Pap tests.  Colorectal cancer can be detected and often prevented. Most routine colorectal cancer screening begins at the age of 29 years and continues through age 85 years. However, your health care provider may recommend screening at an earlier age if you have risk factors for colon cancer. On a yearly basis, your health care provider may provide home test kits to check for hidden blood in the stool. Use of a small camera at the end of a tube, to directly examine the colon (sigmoidoscopy or colonoscopy), can detect the earliest forms of colorectal cancer. Talk to your health care provider about this at age 38, when routine screening begins.  Direct exam of the colon should be repeated every 5-10 years through age 58 years, unless early forms of pre-cancerous polyps or small growths are found. Osteoporosis is a disease in which the bones lose minerals and strength with aging. This can result in serious bone fractures or breaks. The risk of osteoporosis can be identified using a bone density scan. Women ages 74 years and over and women at risk for fractures or osteoporosis should discuss screening with their health care providers. Ask your health care provider whether you should take a calcium supplement or vitamin D to reduce the rate of  osteoporosis. Menopause can be associated with physical symptoms and risks. Hormone replacement therapy is available to decrease symptoms and risks. You should talk to your health care provider about whether hormone replacement therapy is right for you. Use sunscreen. Apply sunscreen liberally and repeatedly throughout the day. You should seek shade when your shadow is shorter than you. Protect yourself by wearing long sleeves, pants, a wide-brimmed hat, and sunglasses year round, whenever you are outdoors. Once a month, do a whole body skin exam, using a mirror to look at the skin on your back. Tell your health care provider of new moles, moles that have irregular borders, moles that are larger than a pencil eraser, or moles that have changed in shape or color. Stay current with required vaccines (immunizations). Influenza vaccine. All adults should be immunized every year. Tetanus, diphtheria, and acellular pertussis (Td, Tdap) vaccine. Pregnant women should receive 1 dose of Tdap vaccine during each pregnancy. The dose should be obtained regardless of the length of time since the last dose. Immunization is preferred during the 27th-36th week of gestation. An adult who has not previously received Tdap or who does not know her vaccine status should receive 1 dose of Tdap. This initial dose should be followed by tetanus and diphtheria toxoids (Td) booster doses every 10 years. Adults with an unknown or incomplete history of completing a 3-dose immunization series with Td-containing vaccines should begin or complete a primary immunization series including a Tdap dose. Adults should receive a Td booster every 10 years.  Zoster vaccine. One dose is recommended for adults aged 21 years or older unless certain conditions are present.  Pneumococcal 13-valent conjugate (PCV13) vaccine. When indicated, a person who is uncertain of her immunization history and has no record of immunization should receive the PCV13  vaccine. An adult aged 80 years or older who has certain medical conditions and has not been previously immunized should receive 1 dose of PCV13 vaccine. This PCV13 should be followed with a dose of pneumococcal polysaccharide (PPSV23) vaccine. The PPSV23  vaccine dose should be obtained at least 1 or more year(s) after the dose of PCV13 vaccine. An adult aged 19 years or older who has certain medical conditions and previously received 1 or more doses of PPSV23 vaccine should receive 1 dose of PCV13. The PCV13 vaccine dose should be obtained 1 or more years after the last PPSV23 vaccine dose.  Pneumococcal polysaccharide (PPSV23) vaccine. When PCV13 is also indicated, PCV13 should be obtained first. All adults aged 65 years and older should be immunized. An adult younger than age 65 years who has certain medical conditions should be immunized. Any person who resides in a nursing home or long-term care facility should be immunized. An adult smoker should be immunized. People with an immunocompromised condition and certain other conditions should receive both PCV13 and PPSV23 vaccines. People with human immunodeficiency virus (HIV) infection should be immunized as soon as possible after diagnosis. Immunization during chemotherapy or radiation therapy should be avoided. Routine use of PPSV23 vaccine is not recommended for American Indians, [place] Natives, or people younger than 65 years unless there are medical conditions that require PPSV23 vaccine. When indicated, people who have unknown immunization and have no record of immunization should receive PPSV23 vaccine. One-time revaccination 5 years after the first dose of PPSV23 is recommended for people aged 19-64 years who have chronic kidney failure, nephrotic syndrome, asplenia, or immunocompromised conditions. People who received 1-2 doses of PPSV23 before age 65 years should receive another dose of PPSV23 vaccine at age 65 years or later if at least 5 years have  passed since the previous dose. Doses of PPSV23 are not needed for people immunized with PPSV23 at or after age 65 years.  Preventive Services / Frequency  Ages 65 years and over Blood pressure check. Lipid and cholesterol check. Lung cancer screening. / Every year if you are aged 55-80 years and have a 30-pack-year history of smoking and currently smoke or have quit within the past 15 years. Yearly screening is stopped once you have quit smoking for at least 15 years or develop a health problem that would prevent you from having lung cancer treatment. Clinical breast exam.** / Every year after age 40 years.  BRCA-related cancer risk assessment.** / For women who have family members with a BRCA-related cancer (breast, ovarian, tubal, or peritoneal cancers). Mammogram.** / Every year beginning at age 40 years and continuing for as long as you are in good health. Consult with your health care provider. Pap test.** / Every 3 years starting at age 30 years through age 65 or 70 years with 3 consecutive normal Pap tests. Testing can be stopped between 65 and 70 years with 3 consecutive normal Pap tests and no abnormal Pap or HPV tests in the past 10 years. Fecal occult blood test (FOBT) of stool. / Every year beginning at age 50 years and continuing until age 75 years. You may not need to do this test if you get a colonoscopy every 10 years. Flexible sigmoidoscopy or colonoscopy.** / Every 5 years for a flexible sigmoidoscopy or every 10 years for a colonoscopy beginning at age 50 years and continuing until age 75 years. Hepatitis C blood test.** / For all people born from 1945 through 1965 and any individual with known risks for hepatitis C. Osteoporosis screening.** / A one-time screening for women ages 65 years and over and women at risk for fractures or osteoporosis. Skin self-exam. / Monthly. Influenza vaccine. / Every year. Tetanus, diphtheria, and acellular pertussis (Tdap/Td) vaccine.** /   1 dose  of Td every 10 years. Zoster vaccine.** / 1 dose for adults aged 60 years or older. Pneumococcal 13-valent conjugate (PCV13) vaccine.** / Consult your health care provider. Pneumococcal polysaccharide (PPSV23) vaccine.** / 1 dose for all adults aged 65 years and older. Screening for abdominal aortic aneurysm (AAA)  by ultrasound is recommended for people who have history of high blood pressure or who are current or former smokers. ++++++++++++++++++++ Recommend Adult Low Dose Aspirin or  coated  Aspirin 81 mg daily  To reduce risk of Colon Cancer 40 %,  Skin Cancer 26 % ,  Melanoma 46%  and  Pancreatic cancer 60% ++++++++++++++++++++ Vitamin D goal  is between 70-100.  Please make sure that you are taking your Vitamin D as directed.  It is very important as a natural anti-inflammatory  helping hair, skin, and nails, as well as reducing stroke and heart attack risk.  It helps your bones and helps with mood. It also decreases numerous cancer risks so please take it as directed.  Low Vit D is associated with a 200-300% higher risk for CANCER  and 200-300% higher risk for HEART   ATTACK  &  STROKE.   ...................................... It is also associated with higher death rate at younger ages,  autoimmune diseases like Rheumatoid arthritis, Lupus, Multiple Sclerosis.    Also many other serious conditions, like depression, Alzheimer's Dementia, infertility, muscle aches, fatigue, fibromyalgia - just to name a few. ++++++++++++++++++ Recommend the book "The END of DIETING" by Dr Joel Fuhrman  & the book "The END of DIABETES " by Dr Joel Fuhrman At [employer] - get book & Audio CD's    Being diabetic has a  300% increased risk for heart attack, stroke, cancer, and alzheimer- type vascular dementia. It is very important that you work harder with diet by avoiding all foods that are white. Avoid white rice (brown & wild rice is OK), white potatoes (sweetpotatoes in moderation is OK),  White bread or wheat bread or anything made out of white flour like bagels, donuts, rolls, buns, biscuits, cakes, pastries, cookies, pizza crust, and pasta (made from white flour & egg whites) - vegetarian pasta or spinach or wheat pasta is OK. Multigrain breads like Arnold's or [employer], or multigrain sandwich thins or flatbreads.  Diet, exercise and weight loss can reverse and cure diabetes in the early stages.  Diet, exercise and weight loss is very important in the control and prevention of complications of diabetes which affects every system in your body, ie. Brain - dementia/stroke, eyes - glaucoma/blindness, heart - heart attack/heart failure, kidneys - dialysis, stomach - gastric paralysis, intestines - malabsorption, nerves - severe painful neuritis, circulation - gangrene & loss of a leg(s), and finally cancer and Alzheimers.    I recommend avoid fried & greasy foods,  sweets/candy, white rice (brown or wild rice or Quinoa is OK), white potatoes (sweet potatoes are OK) - anything made from white flour - bagels, doughnuts, rolls, buns, biscuits,white and wheat breads, pizza crust and traditional pasta made of white flour & egg white(vegetarian pasta or spinach or wheat pasta is OK).  Multi-grain bread is OK - like multi-grain flat bread or sandwich thins. Avoid alcohol in excess. Exercise is also important.    Eat all the vegetables you want - avoid meat, especially red meat and dairy - especially cheese.  Cheese is the most concentrated form of trans-fats which is the worst thing to clog up our arteries. Veggie cheese is OK   which can be found in the fresh produce section at [employer] or [employer] or [employer]  +++++++++++++++++++ DASH Eating Plan  DASH stands for "Dietary Approaches to Stop Hypertension."   The DASH eating plan is a healthy eating plan that has been shown to reduce high blood pressure (hypertension). Additional health benefits may include reducing the risk of type 2  diabetes mellitus, heart disease, and stroke. The DASH eating plan may also help with weight loss. WHAT DO I NEED TO KNOW ABOUT THE DASH EATING PLAN? For the DASH eating plan, you will follow these general guidelines: Choose foods with a percent daily value for sodium of less than 5% (as listed on the food label). Use salt-free seasonings or herbs instead of table salt or sea salt. Check with your health care provider or pharmacist before using salt substitutes. Eat lower-sodium products, often labeled as "lower sodium" or "no salt added." Eat fresh foods. Eat more vegetables, fruits, and low-fat dairy products. Choose whole grains. Look for the word "whole" as the first word in the ingredient list. Choose fish  Limit sweets, desserts, sugars, and sugary drinks. Choose heart-healthy fats. Eat veggie cheese  Eat more home-cooked food and less restaurant, buffet, and fast food. Limit fried foods. Cook foods using methods other than frying. Limit canned vegetables. If you do use them, rinse them well to decrease the sodium. When eating at a restaurant, ask that your food be prepared with less salt, or no salt if possible.                      WHAT FOODS CAN I EAT? Read Dr Joel Fuhrman's books on The End of Dieting & The End of Diabetes  Grains Whole grain or whole wheat bread. Brown rice. Whole grain or whole wheat pasta. Quinoa, bulgur, and whole grain cereals. Low-sodium cereals. Corn or whole wheat flour tortillas. Whole grain cornbread. Whole grain crackers. Low-sodium crackers.  Vegetables Fresh or frozen vegetables (raw, steamed, roasted, or grilled). Low-sodium or reduced-sodium tomato and vegetable juices. Low-sodium or reduced-sodium tomato sauce and paste. Low-sodium or reduced-sodium canned vegetables.   Fruits All fresh, canned (in natural juice), or frozen fruits.  Protein Products  All fish and seafood.  Dried beans, peas, or lentils. Unsalted nuts and seeds. Unsalted  canned beans.  Dairy Low-fat dairy products, such as skim or 1% milk, 2% or reduced-fat cheeses, low-fat ricotta or cottage cheese, or plain low-fat yogurt. Low-sodium or reduced-sodium cheeses.  Fats and Oils Tub margarines without trans fats. Light or reduced-fat mayonnaise and salad dressings (reduced sodium). Avocado. Safflower, olive, or canola oils. Natural peanut or almond butter.  Other Unsalted popcorn and pretzels. The items listed above may not be a complete list of recommended foods or beverages. Contact your dietitian for more options.  +++++++++++++++  WHAT FOODS ARE NOT RECOMMENDED? Grains/ White flour or wheat flour White bread. White pasta. White rice. Refined cornbread. Bagels and croissants. Crackers that contain trans fat.  Vegetables  Creamed or fried vegetables. Vegetables in a . Regular canned vegetables. Regular canned tomato sauce and paste. Regular tomato and vegetable juices.  Fruits Dried fruits. Canned fruit in light or heavy syrup. Fruit juice.  Meat and Other Protein Products Meat in general - RED meat & White meat.  Fatty cuts of meat. Ribs, chicken wings, all processed meats as bacon, sausage, bologna, salami, fatback, hot dogs, bratwurst and packaged luncheon meats.  Dairy Whole or 2% milk, cream, half-and-half, and cream cheese.   Whole-fat or sweetened yogurt. Full-fat cheeses or blue cheese. Non-dairy creamers and whipped toppings. Processed cheese, cheese spreads, or cheese curds.  Condiments Onion and garlic salt, seasoned salt, table salt, and sea salt. Canned and packaged gravies. Worcestershire sauce. Tartar sauce. Barbecue sauce. Teriyaki sauce. Soy sauce, including reduced sodium. Steak sauce. Fish sauce. Oyster sauce. Cocktail sauce. Horseradish. Ketchup and mustard. Meat flavorings and tenderizers. Bouillon cubes. Hot sauce. Tabasco sauce. Marinades. Taco seasonings. Relishes.  Fats and Oils Butter, stick margarine, lard, shortening and  bacon fat. Coconut, palm kernel, or palm oils. Regular salad dressings.  Pickles and olives. Salted popcorn and pretzels.  The items listed above may not be a complete list of foods and beverages to avoid.

## 2022-01-20 NOTE — Progress Notes (Signed)
\  Annual Screening/Preventative Visit & Comprehensive Evaluation &  Examination  Future Appointments  Date Time Provider Department  01/20/2022               CPE  2:00 PM [name], MD [facility]  01/25/2023               CPE   2:00 PM  [facility]  04/26/2023               Wellness 11:00 AM  [facility]         This very nice 81 y.o. [name] presents for a Screening /Preventative Visit & comprehensive evaluation and management of multiple medical co-morbidities.  Patient has been followed for HTN, HLD, Prediabetes  and Vitamin D Deficiency.  Patient has Gout controlled on her meds.  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  She also reports ongoing difficulty with insomnia & tendency to sadness or depressed mood.        In Dec 2022, patient was hospitalized with a post-fall pubic rami fx & Covid Pneumonia treated with Remdesivir.      In March 2023, patient was hospitalized by Dr Earnie Larsson for Lumbar radiculopathy  & underwent uncomplicated right-sided lumbar decompressive surgery.         HTN predates circa 2016. Patient's BP has been controlled at home and patient denies any cardiac symptoms as chest pain, palpitations, shortness of breath, dizziness or ankle swelling. Today's BP is at goal - 118/75.        Patient's hyperlipidemia is not controlled with diet.  Last lipids were not at goal :  Lab Results  Component Value Date   CHOL 191 09/17/2021   HDL 67 09/17/2021   LDLCALC 105 (H) 09/17/2021   TRIG 94 09/17/2021   CHOLHDL 2.9 09/17/2021        Patient has  Moderately Obesity (BMI ~ 32 ) and hx/o prediabetes (A1c 5.9% /2016) and patient denies reactive hypoglycemic symptoms, visual blurring, diabetic polys or paresthesias. Last A1c was at goal :  Lab Results  Component Value Date   HGBA1C 5.5 09/17/2021        Finally, patient has history of Vitamin D Deficiency ("33"/2016) and last Vitamin D was at sl elevated  :  Lab Results   Component Value Date   VD25OH 105.61 (H) 07/18/2021       Current Outpatient Medications on File Prior to Visit  Medication Sig   allopurinol 300 MG tablet TAKE 1 TABLET DAILY    VITAMIN C GUMMIE  Take 1 gummy daily.   aspirin EC 81 MG tablet Take daily.    VITAMIN D  10,000 Units Take daily.    colchicine 0.6 MG tablet TAKE 1 TABLET  DAILY    telmisartan  80 MG tablet Take 1 tablet  daily for blood pressure   topiramate 50 MG tablet Take 1/2 to 1 tablet 2 x /day at Suppertime & Bedtime for Dieting & Weight Loss     Allergies  Allergen Reactions   Codeine Nausea And Vomiting   Hydrocodone Nausea And Vomiting   Lisinopril Cough   Omeprazole Nausea And Vomiting     Past Medical History:  Diagnosis Date   Elevated BP    Prediabetes    Vitamin D deficiency      Health Maintenance  Topic Date Due   COVID-19 Vaccine (1) Never done   Zoster Vaccines- Shingrix (1 of 2) Never done   INFLUENZA VACCINE  03/09/2021   TETANUS/TDAP  07/30/2025   DEXA SCAN  Completed   PNA vac Low Risk Adult  Completed   HPV VACCINES  Aged Out     Immunization History  Administered Date(s) Administered   DT  07/31/2015   DTaP 06/09/2005   Fluad Quad(high Dose ) 05/11/2019   Influenza, High Dose  04/13/2017, 06/10/2020   Influenza 04/09/2014, 05/10/2015, 04/22/2016   Pneumococcal  - 13 07/01/2014   Pneumococcal  - 23 06/10/1999, 06/09/2008   Zoster, Live 06/10/2007    Cologuard - 10/2018 negative    Last MGM - 06/02/2021   No past surgical history on file.   Family History  Problem Relation Age of Onset   Breast cancer Mother      Social History   Tobacco Use   Smoking status: Never   Smokeless tobacco: Never  Substance Use Topics   Alcohol use: No    Alcohol/week: 0.0 standard drinks      ROS Constitutional: Denies fever, chills, weight loss/gain, headaches, insomnia,  night sweats, and change in appetite. Does c/o fatigue. Eyes: Denies redness, blurred vision,  diplopia, discharge, itchy, watery eyes.  ENT: Denies discharge, congestion, post nasal drip, epistaxis, sore throat, earache, hearing loss, dental pain, Tinnitus, Vertigo, Sinus pain, snoring.  Cardio: Denies chest pain, palpitations, irregular heartbeat, syncope, dyspnea, diaphoresis, orthopnea, PND, claudication, edema Respiratory: denies cough, dyspnea, DOE, pleurisy, hoarseness, laryngitis, wheezing.  Gastrointestinal: Denies dysphagia, heartburn, reflux, water brash, pain, cramps, nausea, vomiting, bloating, diarrhea, constipation, hematemesis, melena, hematochezia, jaundice, hemorrhoids Genitourinary: Denies dysuria, frequency, urgency, nocturia, hesitancy, discharge, hematuria, flank pain Breast: Breast lumps, nipple discharge, bleeding.  Musculoskeletal: Denies arthralgia, myalgia, stiffness, Jt. Swelling, pain, limp, and strain/sprain. Denies falls. Skin: Denies puritis, rash, hives, warts, acne, eczema, changing in skin lesion Neuro: No weakness, tremor, incoordination, spasms, paresthesia, pain Psychiatric: Denies confusion, memory loss, sensory loss. Denies Depression. Endocrine: Denies change in weight, skin, hair change, nocturia, and paresthesia, diabetic polys, visual blurring, hyper / hypo glycemic episodes.  Heme/Lymph: No excessive bleeding, bruising, enlarged lymph nodes.  Physical Exam  BP 118/75   Pulse 79   Temp (!) 96.9 F (36.1 C)   Resp 16   Ht '5\' 2"'$  (1.575 m)   Wt 166 lb 6.4 oz (75.5 kg)   SpO2 98%   BMI 30.43 kg/m   General Appearance: Over nourished, well groomed and in no apparent distress.  Eyes: PERRLA, EOMs, conjunctiva no swelling or erythema, normal fundi and vessels. Sinuses: No frontal/maxillary tenderness ENT/Mouth: EACs patent / TMs  nl. Nares clear without erythema, swelling, mucoid exudates. Oral hygiene is good. No erythema, swelling, or exudate. Tongue normal, non-obstructing. Tonsils not swollen or erythematous. Hearing normal.  Neck:  Supple, thyroid not palpable. No bruits, nodes or JVD. Respiratory: Respiratory effort normal.  BS equal and clear bilateral without rales, rhonci, wheezing or stridor. Cardio: Heart sounds are normal with regular rate and rhythm and no murmurs, rubs or gallops. Peripheral pulses are normal and equal bilaterally without edema. No aortic or femoral bruits. Chest: symmetric with normal excursions and percussion. Breasts: Symmetric, without lumps, nipple discharge, retractions, or fibrocystic changes.  Abdomen: Flat, soft with bowel sounds active. Nontender, no guarding, rebound, hernias, masses, or organomegaly.  Lymphatics: Non tender without lymphadenopathy.  Genitourinary:  Musculoskeletal: Full ROM all peripheral extremities, joint stability, 5/5 strength, and normal gait. Skin: Warm and dry without rashes, lesions, cyanosis, clubbing or  ecchymosis.  Neuro: Cranial nerves intact, reflexes equal bilaterally. Normal muscle tone, no cerebellar symptoms.  Sensation intact.  Pysch: Alert and oriented X 3, normal affect, Insight and Judgment appropriate.    Assessment and Plan  1. Annual Preventative Screening Examination   2. Labile hypertension  - EKG 12-Lead - Urinalysis, Routine w reflex microscopic - Microalbumin / creatinine urine ratio - CBC with Differential/Platelet - COMPLETE METABOLIC PANEL WITH GFR - Magnesium - TSH  3. Hyperlipidemia, mixed  - EKG 12-Lead - Lipid panel - TSH  4. Abnormal glucose  - EKG 12-Lead - Hemoglobin A1c - Insulin, random  5. Vitamin D deficiency  - VITAMIN D 25 Hydroxy   6. Idiopathic chronic gout   - Uric acid  7. Class 2 severe obesity due to excess calories with serious comorbidity  and body mass index (BMI) of 35.0 to 35.9 in adult (HCC)  - TSH  8. Screening for colorectal cancer  - POC Hemoccult Bld  9. Screening for heart disease  - EKG 12-Lead  10. Medication management  - Urinalysis, Routine w reflex  microscopic - Microalbumin / creatinine urine ratio - CBC with Differential/Platelet - COMPLETE METABOLIC PANEL WITH GFR - Magnesium - Lipid panel - TSH - Hemoglobin A1c - Insulin, random - VITAMIN D 25 Hydroxy  - Uric acid         Patient was counseled in prudent diet to achieve/maintain BMI less than 25 for weight control, BP monitoring, regular exercise and medications. Discussed med's effects and SE's. Screening labs and tests as requested with regular follow-up as recommended. Over 40 minutes of exam, counseling, chart review and high complex critical decision making was performed.   Kirtland Bouchard, MD

## 2022-01-21 ENCOUNTER — Other Ambulatory Visit: Payer: Self-pay | Admitting: Internal Medicine

## 2022-01-21 ENCOUNTER — Other Ambulatory Visit: Payer: Self-pay | Admitting: Adult Health

## 2022-01-21 DIAGNOSIS — Z79899 Other long term (current) drug therapy: Secondary | ICD-10-CM

## 2022-01-21 LAB — COMPLETE METABOLIC PANEL WITH GFR
AG Ratio: 1.5 (calc) (ref 1.0–2.5)
ALT: 13 U/L (ref 6–29)
AST: 22 U/L (ref 10–35)
Albumin: 4.6 g/dL (ref 3.6–5.1)
Alkaline phosphatase (APISO): 90 U/L (ref 37–153)
BUN/Creatinine Ratio: 24 (calc) — ABNORMAL HIGH (ref 6–22)
BUN: 23 mg/dL (ref 7–25)
CO2: 24 mmol/L (ref 20–32)
Calcium: 10.6 mg/dL — ABNORMAL HIGH (ref 8.6–10.4)
Chloride: 106 mmol/L (ref 98–110)
Creat: 0.97 mg/dL — ABNORMAL HIGH (ref 0.60–0.95)
Globulin: 3 g/dL (calc) (ref 1.9–3.7)
Glucose, Bld: 90 mg/dL (ref 65–99)
Potassium: 4.2 mmol/L (ref 3.5–5.3)
Sodium: 140 mmol/L (ref 135–146)
Total Bilirubin: 0.4 mg/dL (ref 0.2–1.2)
Total Protein: 7.6 g/dL (ref 6.1–8.1)
eGFR: 58 mL/min/{1.73_m2} — ABNORMAL LOW (ref >=60)

## 2022-01-21 LAB — CBC WITH DIFFERENTIAL/PLATELET
Absolute Monocytes: 561 cells/uL (ref 200–950)
Basophils Absolute: 41 cells/uL (ref 0–200)
Basophils Relative: 0.7 %
Eosinophils Absolute: 159 cells/uL (ref 15–500)
Eosinophils Relative: 2.7 %
HCT: 37.6 % (ref 35.0–45.0)
Hemoglobin: 12.9 g/dL (ref 11.7–15.5)
Lymphs Abs: 2195 cells/uL (ref 850–3900)
MCH: 30.9 pg (ref 27.0–33.0)
MCHC: 34.3 g/dL (ref 32.0–36.0)
MCV: 90 fL (ref 80.0–100.0)
MPV: 10.8 fL (ref 7.5–12.5)
Monocytes Relative: 9.5 %
Neutro Abs: 2944 cells/uL (ref 1500–7800)
Neutrophils Relative %: 49.9 %
Platelets: 295 10*3/uL (ref 140–400)
RBC: 4.18 10*6/uL (ref 3.80–5.10)
RDW: 14.8 % (ref 11.0–15.0)
Total Lymphocyte: 37.2 %
WBC: 5.9 10*3/uL (ref 3.8–10.8)

## 2022-01-21 LAB — URINALYSIS, ROUTINE W REFLEX MICROSCOPIC
Bacteria, UA: NONE SEEN /HPF
Bilirubin Urine: NEGATIVE
Glucose, UA: NEGATIVE
Hgb urine dipstick: NEGATIVE
Hyaline Cast: NONE SEEN /LPF
Ketones, ur: NEGATIVE
Nitrite: NEGATIVE
Protein, ur: NEGATIVE
RBC / HPF: NONE SEEN /HPF (ref 0–2)
Specific Gravity, Urine: 1.006 (ref 1.001–1.035)
Squamous Epithelial / HPF: NONE SEEN /HPF (ref <=5)
pH: 5.5 (ref 5.0–8.0)

## 2022-01-21 LAB — LIPID PANEL
Cholesterol: 186 mg/dL (ref <200)
HDL: 58 mg/dL (ref >=50)
LDL Cholesterol (Calc): 106 mg/dL (calc) — ABNORMAL HIGH
Non-HDL Cholesterol (Calc): 128 mg/dL (calc) (ref <130)
Total CHOL/HDL Ratio: 3.2 (calc) (ref <5.0)
Triglycerides: 123 mg/dL (ref <150)

## 2022-01-21 LAB — HEMOGLOBIN A1C
Hgb A1c MFr Bld: 5.6 % of total Hgb (ref <5.7)
Mean Plasma Glucose: 114 mg/dL
eAG (mmol/L): 6.3 mmol/L

## 2022-01-21 LAB — TSH: TSH: 2.13 mIU/L (ref 0.40–4.50)

## 2022-01-21 LAB — MAGNESIUM: Magnesium: 2.3 mg/dL (ref 1.5–2.5)

## 2022-01-21 LAB — MICROALBUMIN / CREATININE URINE RATIO
Creatinine, Urine: 28 mg/dL (ref 20–275)
Microalb, Ur: <0.2 mg/dL

## 2022-01-21 LAB — URIC ACID: Uric Acid, Serum: 3.5 mg/dL (ref 2.5–7.0)

## 2022-01-21 LAB — VITAMIN D 25 HYDROXY (VIT D DEFICIENCY, FRACTURES): Vit D, 25-Hydroxy: 77 ng/mL (ref 30–100)

## 2022-01-21 LAB — MICROSCOPIC MESSAGE

## 2022-01-21 LAB — INSULIN, RANDOM: Insulin: 19.2 u[IU]/mL — ABNORMAL HIGH

## 2022-01-23 ENCOUNTER — Other Ambulatory Visit: Payer: Self-pay | Admitting: Internal Medicine

## 2022-01-23 DIAGNOSIS — N3000 Acute cystitis without hematuria: Secondary | ICD-10-CM

## 2022-01-23 NOTE — Progress Notes (Signed)
<><><><><><><><><><><><><><><><><><><><><><><><><><><><><><><><><> <><><><><><><><><><><><><><><><><><><><><><><><><><><><><><><><><> -   Test results slightly outside the reference range are not unusual. If there is anything important, I will review this with you,  otherwise it is considered normal test values.  If you have further questions,  please do not hesitate to contact me at the office or via My Chart.  <><><><><><><><><><><><><><><><><><><><><><><><><><><><><><><><><> <><><><><><><><><><><><><><><><><><><><><><><><><><><><><><><><><>  -  U/A is loaded with WBC  & is suspicious for UTI,                            Recommend call office to schedule a Nurse Visit to                                                                                       recheck urine for Infection  <><><><><><><><><><><><><><><><><><><><><><><><><><><><><><><><><> <><><><><><><><><><><><><><><><><><><><><><><><><><><><><><><><><>  -  Kidney Functions Stage 3a & stable - Important to drink at least 100 oz water or liquids  /day  <><><><><><><><><><><><><><><><><><><><><><><><><><><><><><><><><>  -  Total Chol = 186 "OK" , But - Cholesterol is too high - Recommend low cholesterol diet                  (  Ideal or Goal is less than 180  !  )                   LDL Chol = 106 - Elevated                              ( Ideal or Goal is less than 70  !  )  - So need a stricter low chol diet   - Cholesterol only comes from animal sources  - ie. meat, dairy, egg yolks  - Eat all the vegetables you want.  - Avoid Meat, Avoid Meat,  Avoid Meat - especially Red Meat - Beef AND Pork .  - Avoid cheese & dairy - milk & ice cream.     - Cheese is the most concentrated form of trans-fats which  is the worst thing to clog up our arteries.   - Veggie cheese is OK which can be found in the fresh  produce section at [employer] or [employer] or  [employer] <><><><><><><><><><><><><><><><><><><><><><><><><><><><><><><><><>  -  A1c - Normal - No Diabetes  - Great  !  <><><><><><><><><><><><><><><><><><><><><><><><><><><><><><><><><>  -  Vitamin D = 77  - Excellent   - Please keep dose same  !   <><><><><><><><><><><><><><><><><><><><><><><><><><><><><><><><><> <><><><><><><><><><><><><><><><><><><><><><><><><><><><><><><><><>

## 2022-01-28 ENCOUNTER — Ambulatory Visit (INDEPENDENT_AMBULATORY_CARE_PROVIDER_SITE_OTHER): Payer: Medicare Other

## 2022-01-28 VITALS — BP 104/62 | HR 74 | Temp 97.7°F | Wt 164.8 lb

## 2022-01-28 DIAGNOSIS — N3000 Acute cystitis without hematuria: Secondary | ICD-10-CM | POA: Diagnosis not present

## 2022-01-28 NOTE — Progress Notes (Signed)
Patient presents today for a Nurse Visit to recheck for a UTI. She said she has had some chills but she is always cold, is having some lower stomach discomfort. She denies and burning or frequent urination.

## 2022-01-29 LAB — URINE CULTURE
MICRO NUMBER:: 13560381
SPECIMEN QUALITY:: ADEQUATE

## 2022-01-29 LAB — URINALYSIS, ROUTINE W REFLEX MICROSCOPIC
Bacteria, UA: NONE SEEN /HPF
Bilirubin Urine: NEGATIVE
Glucose, UA: NEGATIVE
Hgb urine dipstick: NEGATIVE
Hyaline Cast: NONE SEEN /LPF
Ketones, ur: NEGATIVE
Nitrite: NEGATIVE
Protein, ur: NEGATIVE
Specific Gravity, Urine: 1.022 (ref 1.001–1.035)
pH: <=5 (ref 5.0–8.0)

## 2022-01-29 LAB — MICROSCOPIC MESSAGE

## 2022-02-04 DIAGNOSIS — M5416 Radiculopathy, lumbar region: Secondary | ICD-10-CM | POA: Diagnosis not present

## 2022-02-05 ENCOUNTER — Other Ambulatory Visit: Payer: Self-pay | Admitting: Adult Health

## 2022-02-11 ENCOUNTER — Other Ambulatory Visit: Payer: Self-pay | Admitting: Adult Health

## 2022-02-11 DIAGNOSIS — E790 Hyperuricemia without signs of inflammatory arthritis and tophaceous disease: Secondary | ICD-10-CM

## 2022-02-11 MED ORDER — COLCHICINE 0.6 MG PO TABS: ORAL_TABLET | ORAL | 1 refills | Status: DC

## 2022-03-06 IMAGING — MG MM DIGITAL SCREENING BILAT W/ TOMO AND CAD
8 series · 8 of 24 positions shown · non-contrast
Comparison: Previous exam(s).

CLINICAL DATA: Screening.

EXAM:
DIGITAL SCREENING BILATERAL MAMMOGRAM WITH TOMOSYNTHESIS AND CAD
TECHNIQUE: Bilateral screening digital craniocaudal and mediolateral oblique
mammograms were obtained. Bilateral screening digital breast
tomosynthesis was performed. The images were evaluated with
computer-aided detection.

[R CC synth-2D]
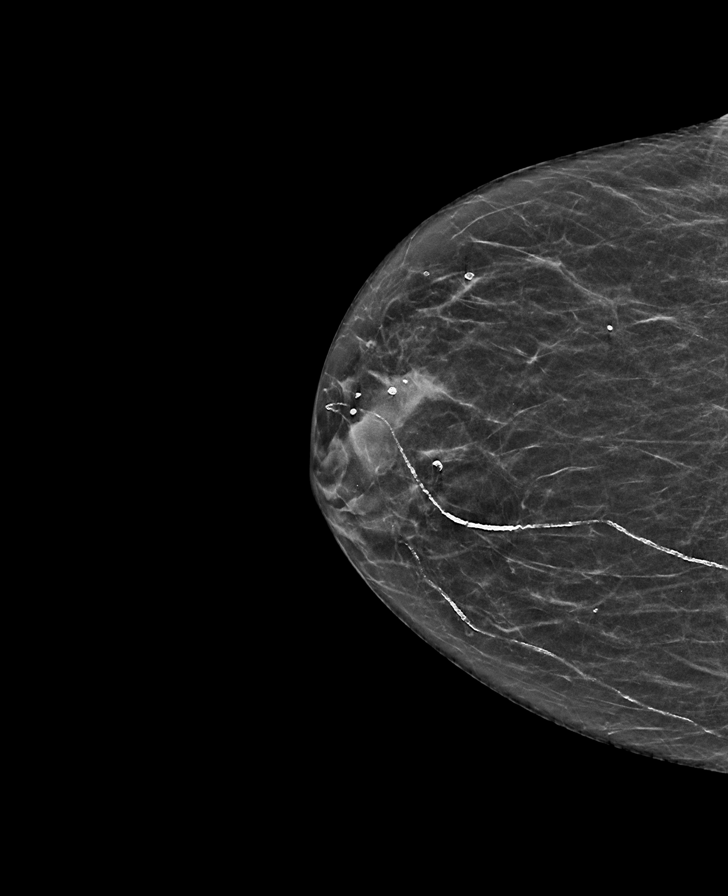

[R MLO synth-2D]
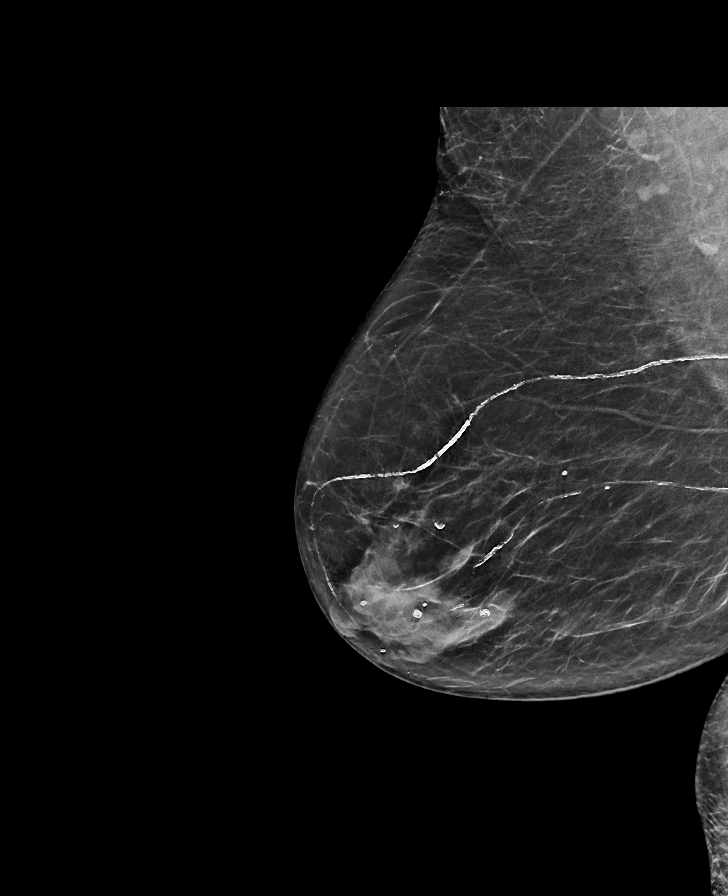

[L CC synth-2D]
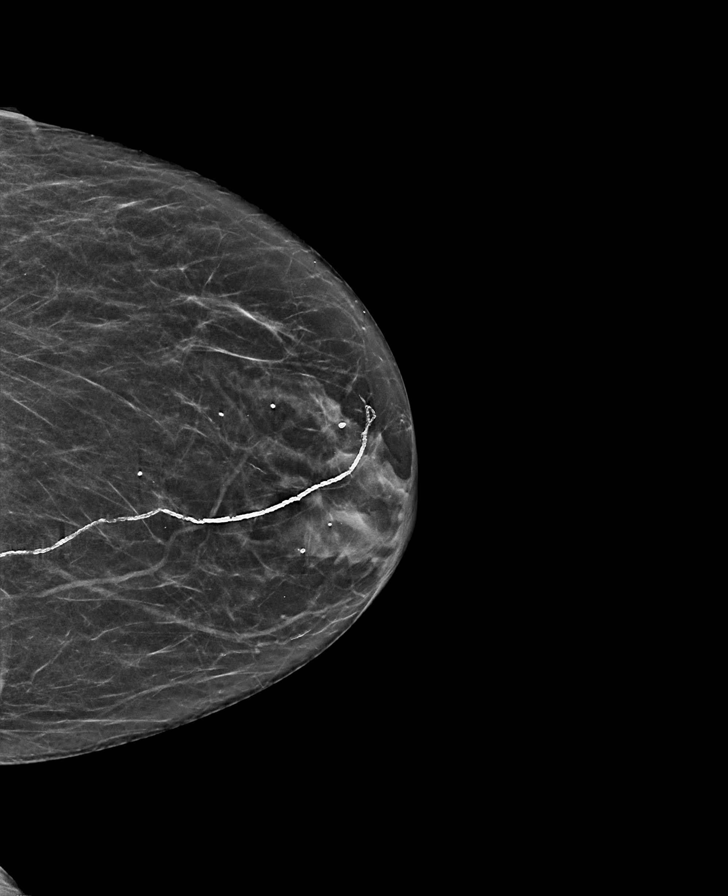

[L MLO synth-2D]
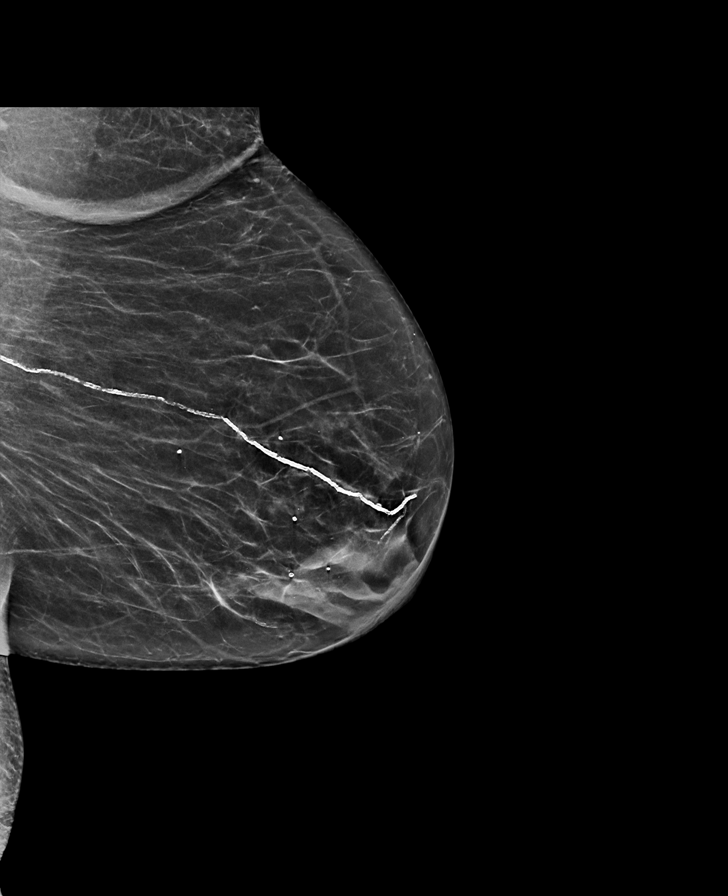

[L CC tomo · tomo slice 27/52.0]
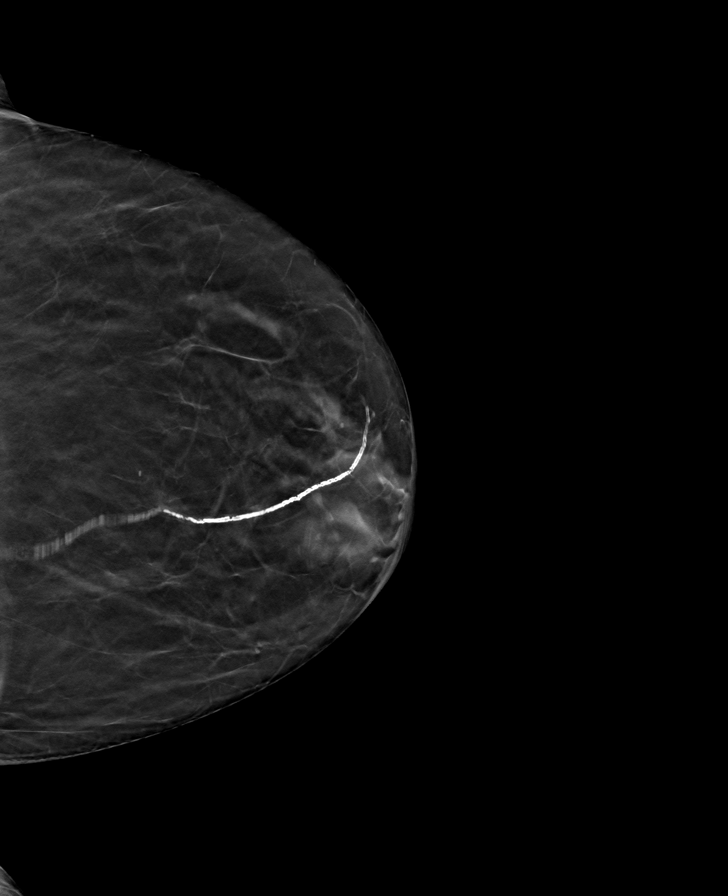

[R MLO tomo · tomo slice 34/67.0]
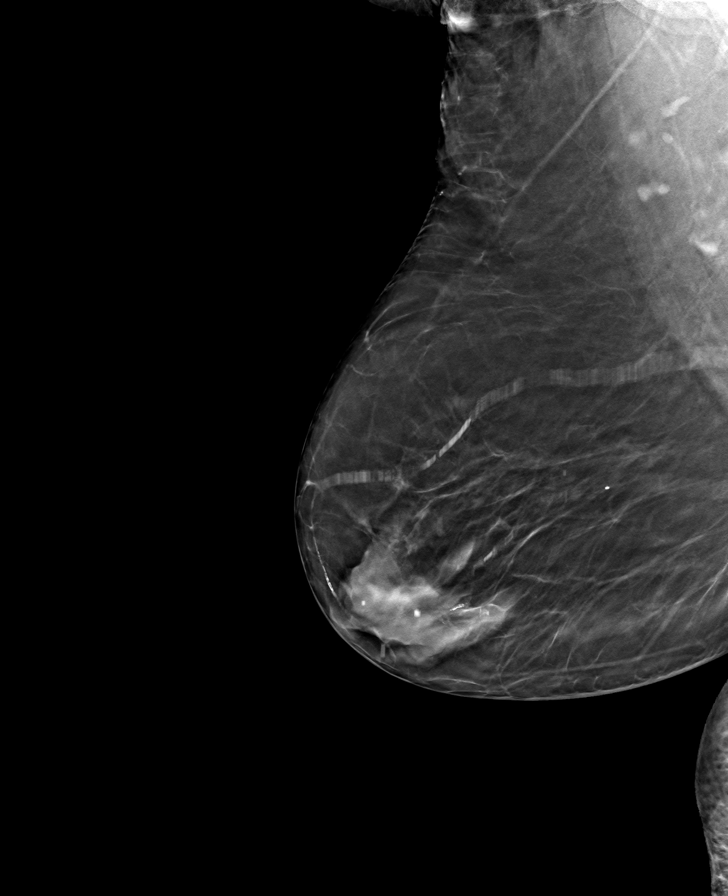

[L MLO tomo · tomo slice 31/62.0]
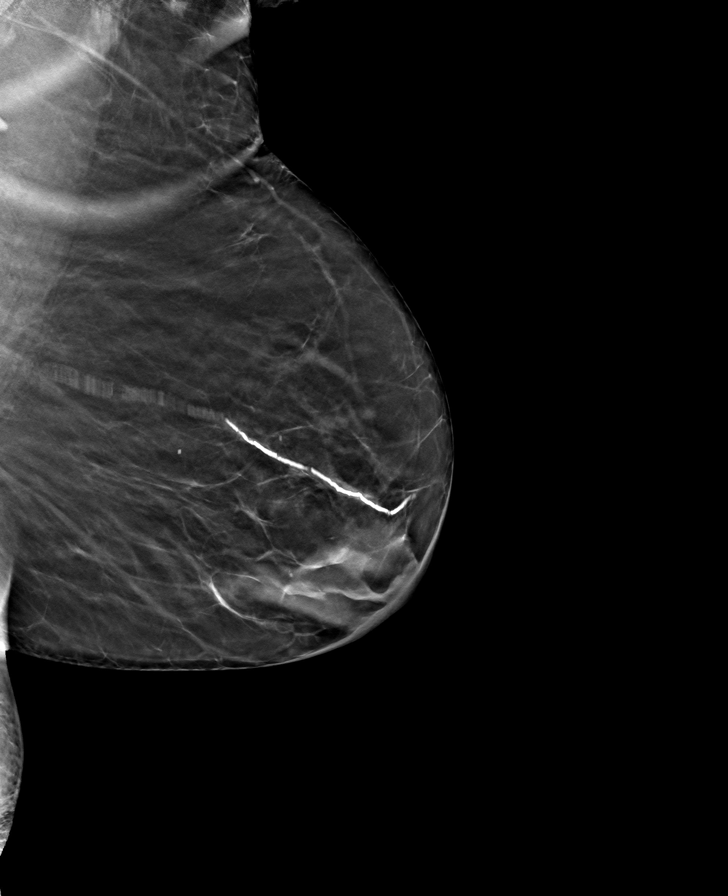

[R CC tomo · tomo slice 27/52.0]
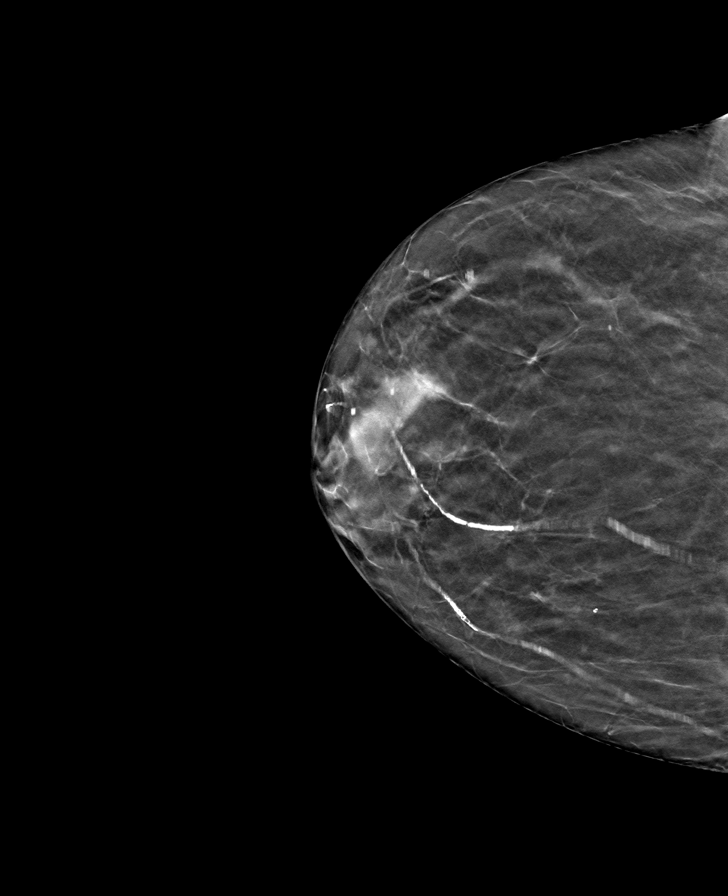

[8 of 24 positions shown; findings below may reference images not displayed]

ACR Breast Density Category c: The breast tissue is heterogeneously
dense, which may obscure small masses.
FINDINGS: There are no findings suspicious for malignancy.
IMPRESSION: No mammographic evidence of malignancy. A result letter of this
screening mammogram will be mailed directly to the patient.

RECOMMENDATION:
Screening mammogram in one year. (Code:Q3-W-BC3)

BI-RADS CATEGORY  1: Negative.

## 2022-03-11 ENCOUNTER — Other Ambulatory Visit: Payer: Self-pay | Admitting: Internal Medicine

## 2022-03-11 DIAGNOSIS — E790 Hyperuricemia without signs of inflammatory arthritis and tophaceous disease: Secondary | ICD-10-CM

## 2022-03-11 MED ORDER — COLCHICINE 0.6 MG PO TABS: ORAL_TABLET | ORAL | 1 refills | Status: DC

## 2022-03-29 ENCOUNTER — Other Ambulatory Visit: Payer: Self-pay

## 2022-03-29 DIAGNOSIS — E790 Hyperuricemia without signs of inflammatory arthritis and tophaceous disease: Secondary | ICD-10-CM

## 2022-03-29 MED ORDER — COLCHICINE 0.6 MG PO TABS: ORAL_TABLET | ORAL | 1 refills | Status: DC

## 2022-04-26 NOTE — Progress Notes (Unsigned)
3 MONTH FOLLOW UP  Assessment:    Labile hypertension - continue medications, DASH diet, exercise and monitor at home. Call if greater than 130/80.  -     CBC with Differential/Platelet -     CMP/GFR  Other abnormal glucose (Hx of prediabetes) Discussed disease and risks Discussed diet/exercise, weight management  Check A1C q13m CMP for glucose otherwise  Stage 3A CKD(HCC) Increase fluids, avoid NSAIDS, monitor sugars, will monitor  - CMP  Hyperlipademia -continue medications, check lipids, decrease fatty foods, increase activity.  -     Lipid panel  Vitamin D deficiency Continue supplementation  Obesity with co morbidities - BMI 334Long discussion about weight loss, diet, and exercise Recommended diet heavy in fruits and veggies and low in animal meats, cheeses, and dairy products, appropriate calorie intake Patient will work on decreasing saturated fats and simple carbs and increasing protein Follow up at next visit  DJD, multiple sites Controlled, ortho/neuro following  Hyperuricemia On allopurinol 150 mg, had SE with 300 mg, uses colchicine PRN flares Discussed diet/lifestyle; discussed colchicine to hold and start if future gout flares  Hypercalcemia Recheck CMP and check PTH  Osteopenia - get dexa in 2 years, continue Vit D and Ca, weight bearing exercises  Medication management Continued  [place] reports managing at [place], declines referral back to ortho  Xray 2/23 showed healing fractures Follow up with Dr. PAnnette Stablefor numbness/pain in left leg- if does not believe was related to back surgery notify office and will plan further evaluation   Systolic murmur Continue to follow with Dr. HPercival Spanish     Over 30 minutes of face to face interview, exam, counseling, chart review, and critical decision making was performed  Future Appointments  Date Time Provider DImbler 01/25/2023  2:00 PM MUnk Pinto MD [facility] None  04/26/2023 11:00 AM CLiane Comber NP [facility] None       Subjective:   Angelica DAUPHINis a 81y.o. female who presents for3 month follow up on hypertension, prediabetes, hyperlipidemia, vitamin D def and morbid obesity.  Husband passed away 303/25/21 [facility] She feels she is doing fairly well. Living by herself now, has cats and dogs to keep her company. Family is close by and checks on her regularly.  She had mechanical fall (tripped on cat) in December 2022 and was admitted for left rami fracture, Dr. DMarlou Sarecommended conservative therapy, PT and vitamin D. She was also found to be covid 19 positive with pneumonia and underwent remdesivir therapy. She was discharged home on 07/23/2021 with plan for PT/OT, follow up with orthopedic surgery in 3-4 weeks. She reports never had PT, hasn't heard from ortho and never saw. Doesn't want to see them, has been doing gradual exercises at home, declines referral back to ortho or for PT. Xray 09/25/21:  Healing superior and inferior pubic rami fractures on the left as described.  She follows with Dr. PAnnette Stablefor injections, chronic lumbar back pain. She had lumbar laminectomy 10/19/21 with Dr. PAnnette Stable Continues to have left leg weakness, pain and burning. She has not done any PT and declines a referral.  Back pain prior to surgery has resolved.   She does have hx of osteopenia, last 06/02/2021 with left fem neck T -1.4.   She was evaluated by Dr. HPercival Spanish2/27/23 for murmur which he attributed to aortic stenosis. Echo confirmed moderate aortic stenosis and he will continue to  follow.   BMI is Body mass index is 30.29 kg/m., she has been working on diet, stops eating after sundown, watching portions. Active around her yard but not intentionally active. She tried to limit simple carbs. She drinks a lot of black coffee.  Trying to get more water. She is getting a good amount of protein throughout the day Wt  Readings from Last 3 Encounters:  04/27/22 165 lb 9.6 oz (75.1 kg)  01/28/22 164 lb 12.8 oz (74.8 kg)  01/20/22 166 lb 6.4 oz (75.5 kg)   Her blood pressure has been controlled at home, today their BP is BP: 124/68  BP Readings from Last 3 Encounters:  04/27/22 124/68  01/28/22 104/62  01/20/22 118/75  She does workout. She denies chest pain, shortness of breath, dizziness.    She is not on cholesterol medication and denies myalgias. Her cholesterol is at goal. The cholesterol last visit was:   Lab Results  Component Value Date   CHOL 186 01/20/2022   HDL 58 01/20/2022   LDLCALC 106 (H) 01/20/2022   TRIG 123 01/20/2022   CHOLHDL 3.2 01/20/2022   She has hx of prediabetes improved to normal at last check. She reports that she has been working on diet and exercise.  Lab Results  Component Value Date   HGBA1C 5.6 01/20/2022   Last GFR Lab Results  Component Value Date   GFRNONAA >60 10/15/2021   Patient is on Vitamin D supplement, takes 10000 IU but not daily  Lab Results  Component Value Date   VD25OH 77 01/20/2022     Patient is on allopurinol (takes 1/2 tab only due to interance, takes colchicine if needed.) for gout and does  not report a recent flare.  Lab Results  Component Value Date   LABURIC 3.5 01/20/2022      Medication Review Current Outpatient Medications on File Prior to Visit  Medication Sig Dispense Refill   allopurinol (ZYLOPRIM) 300 MG tablet TAKE 1 TABLET BY MOUTH ONCE DAILY TO PREVENT GOUT 90 tablet 3   amitriptyline (ELAVIL) 10 MG tablet TAKE 1 TO 2 TABLETS BY MOUTH AT BEDTIME AS NEEDED FOR SLEEP (Patient taking differently: PRN) 60 tablet 0   Ascorbic Acid (VITAMIN C GUMMIE PO) Take 1 each by mouth daily.     aspirin EC 81 MG tablet Take 81 mg by mouth daily. Reported on 07/31/2015     Cholecalciferol (VITAMIN D PO) Take 5,000 Units by mouth daily.     colchicine 0.6 MG tablet TAKE 2 TAB WITH ONSET OF GOUT FLARE, THEN TAKE 1 TAB 1 HOUR LATER  (MAX 3 TABS/TREATMENT); REPEAT IN 3 DAYS IF NEEDED. 30 tablet 1   Milk Thistle 1000 MG CAPS Take by mouth. 3000, mg  Milk Thistle, NAC, Chanca, Piedra, Beet RooTudca, and Dandelion     Multiple Vitamin (MULTIVITAMIN WITH MINERALS) TABS tablet Take 1 tablet by mouth daily. 30 tablet 0   telmisartan (MICARDIS) 80 MG tablet Take  1 tablet  Daily  for BP. 90 tablet 3   No current facility-administered medications on file prior to visit.    Allergies: Allergies  Allergen Reactions   Codeine Nausea And Vomiting   Hydrocodone Nausea And Vomiting   Lisinopril Cough   Prilosec [Omeprazole] Nausea And Vomiting   Epinephrine Palpitations    Irregular heart rate    Current Problems (verified) has Labile hypertension; Vitamin D deficiency; Hyperlipidemia, mixed; DJD (degenerative joint disease), multiple sites; Hyperuricemia; Abnormal glucose (hx of prediabetes); Obesity (BMI  30.0-34.9); Osteopenia; Pubic ramus fracture ([place]); Systolic murmur; and Lumbar radiculopathy on their problem list.  Screening Tests Immunization History  Administered Date(s) Administered   DT (Pediatric) 07/31/2015   DTaP 06/09/2005   Fluad Quad(high Dose 65+) 05/11/2019, 04/21/2021   Influenza, High Dose Seasonal PF 04/13/2017, 06/10/2020   Influenza-Unspecified 04/09/2014, 05/10/2015, 04/22/2016   Pneumococcal Conjugate-13 07/01/2014   Pneumococcal-Unspecified 06/10/1999, 06/09/2008   Zoster, Live 06/10/2007   Health Maintenance  Topic Date Due   COVID-19 Vaccine (1) Never done   Zoster Vaccines- Shingrix (1 of 2) Never done   INFLUENZA VACCINE  03/09/2022   MAMMOGRAM  06/03/2023   DEXA SCAN  06/03/2023   TETANUS/TDAP  07/30/2025   HPV VACCINES  Aged Out   Pneumonia Vaccine 22+ Years old  Discontinued    Last colonoscopy: cologuard - 10/2018 negative - DONE Last mammogram: annually at breast center DEXA: 06/02/2021 osteopenia - T -1.4  Names of Other Physician/Practitioners you currently use: 1.  [facility]- here for primary care 2. Dr. Santo Held, eye doctor, last visit 2022 3. Dr. Orene Desanctis, dentist, last visit 2021,  q 6 months, going today   Patient Care Team: [name], MD as PCP - General (Internal Medicine) Monna Fam, MD as Consulting Physician (Ophthalmology) Druscilla Brownie, MD as Consulting Physician (Dermatology)  Surgical: She  has a past surgical history that includes Lumbar back surgery; Vaginal hysterectomy; Foot surgery; Tonsillectomy (N/A); Appendectomy; and Lumbar laminectomy/decompression microdiscectomy (Right, 10/19/2021). Family Her family history includes Breast cancer in her mother; Heart attack (age of onset: 53) in her father; Heart attack (age of onset: 60) in her mother. Social history  She reports that she has never smoked. She has never used smokeless tobacco. She reports that she does not drink alcohol and does not use drugs.    Objective:   Today's Vitals   04/27/22 1035  BP: 124/68  Pulse: 81  Temp: (!) 97.3 F (36.3 C)  SpO2: 97%  Weight: 165 lb 9.6 oz (75.1 kg)  Height: '5\' 2"'$  (1.575 m)     Body mass index is 30.29 kg/m.  General appearance: alert, no distress, WD/WN,  female HEENT: normocephalic, sclerae anicteric, TMs pearly, nares patent, no discharge or erythema, pharynx normal Oral cavity: MMM, no lesions Neck: supple, no lymphadenopathy, no thyromegaly, no masses Heart: RRR, normal S1, S2, 3/6 decreshendo systolic murmur R 2nd ICS Lungs: CTA bilaterally, no wheezes, rhonchi, or rales Abdomen: +bs, soft, non tender, non distended, no masses, no hepatomegaly, no splenomegaly Musculoskeletal: strength 5/5 , mild tenderness left thigh  Extremities: no edema, no cyanosis, no clubbing Pulses: 2+ symmetric, upper and lower extremities, normal cap refill Neurological: alert, oriented x 3, CN2-12 intact, strength normal upper extremities and lower extremities, sensation normal  throughout, DTRs 2+ throughout, no cerebellar signs, gait slow steady  Psychiatric: normal affect, behavior normal, pleasant      Alycia Rossetti, NP   04/27/2022

## 2022-04-27 ENCOUNTER — Ambulatory Visit (INDEPENDENT_AMBULATORY_CARE_PROVIDER_SITE_OTHER): Payer: Medicare Other | Admitting: Nurse Practitioner

## 2022-04-27 ENCOUNTER — Encounter: Payer: Self-pay | Admitting: Nurse Practitioner

## 2022-04-27 VITALS — BP 124/68 | HR 81 | Temp 97.3°F | Ht 62.0 in | Wt 165.6 lb

## 2022-04-27 DIAGNOSIS — E559 Vitamin D deficiency, unspecified: Secondary | ICD-10-CM

## 2022-04-27 DIAGNOSIS — R7309 Other abnormal glucose: Secondary | ICD-10-CM | POA: Diagnosis not present

## 2022-04-27 DIAGNOSIS — N1831 Chronic kidney disease, stage 3a: Secondary | ICD-10-CM | POA: Diagnosis not present

## 2022-04-27 DIAGNOSIS — M159 Polyosteoarthritis, unspecified: Secondary | ICD-10-CM | POA: Diagnosis not present

## 2022-04-27 DIAGNOSIS — M8580 Other specified disorders of bone density and structure, unspecified site: Secondary | ICD-10-CM | POA: Diagnosis not present

## 2022-04-27 DIAGNOSIS — S32592A Other specified fracture of left pubis, initial encounter for closed fracture: Secondary | ICD-10-CM

## 2022-04-27 DIAGNOSIS — R011 Cardiac murmur, unspecified: Secondary | ICD-10-CM

## 2022-04-27 DIAGNOSIS — E790 Hyperuricemia without signs of inflammatory arthritis and tophaceous disease: Secondary | ICD-10-CM

## 2022-04-27 DIAGNOSIS — Z79899 Other long term (current) drug therapy: Secondary | ICD-10-CM | POA: Diagnosis not present

## 2022-04-27 DIAGNOSIS — E782 Mixed hyperlipidemia: Secondary | ICD-10-CM | POA: Diagnosis not present

## 2022-04-27 DIAGNOSIS — R0989 Other specified symptoms and signs involving the circulatory and respiratory systems: Secondary | ICD-10-CM

## 2022-04-27 MED ORDER — TOPIRAMATE 50 MG PO TABS: ORAL_TABLET | ORAL | 1 refills | Status: DC

## 2022-04-28 LAB — COMPLETE METABOLIC PANEL WITH GFR
AG Ratio: 1.8 (calc) (ref 1.0–2.5)
ALT: 14 U/L (ref 6–29)
AST: 23 U/L (ref 10–35)
Albumin: 4.6 g/dL (ref 3.6–5.1)
Alkaline phosphatase (APISO): 83 U/L (ref 37–153)
BUN: 14 mg/dL (ref 7–25)
CO2: 24 mmol/L (ref 20–32)
Calcium: 10.2 mg/dL (ref 8.6–10.4)
Chloride: 104 mmol/L (ref 98–110)
Creat: 0.88 mg/dL (ref 0.60–0.95)
Globulin: 2.5 g/dL (calc) (ref 1.9–3.7)
Glucose, Bld: 90 mg/dL (ref 65–99)
Potassium: 4.1 mmol/L (ref 3.5–5.3)
Sodium: 139 mmol/L (ref 135–146)
Total Bilirubin: 0.3 mg/dL (ref 0.2–1.2)
Total Protein: 7.1 g/dL (ref 6.1–8.1)
eGFR: 65 mL/min/{1.73_m2} (ref >=60)

## 2022-04-28 LAB — CBC WITH DIFFERENTIAL/PLATELET
Absolute Monocytes: 626 cells/uL (ref 200–950)
Basophils Absolute: 38 cells/uL (ref 0–200)
Basophils Relative: 0.7 %
Eosinophils Absolute: 238 cells/uL (ref 15–500)
Eosinophils Relative: 4.4 %
HCT: 39.6 % (ref 35.0–45.0)
Hemoglobin: 13.2 g/dL (ref 11.7–15.5)
Lymphs Abs: 2074 cells/uL (ref 850–3900)
MCH: 31.4 pg (ref 27.0–33.0)
MCHC: 33.3 g/dL (ref 32.0–36.0)
MCV: 94.1 fL (ref 80.0–100.0)
MPV: 10.3 fL (ref 7.5–12.5)
Monocytes Relative: 11.6 %
Neutro Abs: 2425 cells/uL (ref 1500–7800)
Neutrophils Relative %: 44.9 %
Platelets: 306 10*3/uL (ref 140–400)
RBC: 4.21 10*6/uL (ref 3.80–5.10)
RDW: 14.1 % (ref 11.0–15.0)
Total Lymphocyte: 38.4 %
WBC: 5.4 10*3/uL (ref 3.8–10.8)

## 2022-04-28 LAB — PTH, INTACT AND CALCIUM
Calcium: 10.2 mg/dL (ref 8.6–10.4)
PTH: 24 pg/mL (ref 16–77)

## 2022-04-28 LAB — LIPID PANEL
Cholesterol: 180 mg/dL (ref <200)
HDL: 57 mg/dL (ref >=50)
LDL Cholesterol (Calc): 97 mg/dL (calc)
Non-HDL Cholesterol (Calc): 123 mg/dL (calc) (ref <130)
Total CHOL/HDL Ratio: 3.2 (calc) (ref <5.0)
Triglycerides: 159 mg/dL — ABNORMAL HIGH (ref <150)

## 2022-05-05 ENCOUNTER — Encounter: Payer: Self-pay | Admitting: Internal Medicine

## 2022-05-07 ENCOUNTER — Other Ambulatory Visit: Payer: Self-pay | Admitting: Internal Medicine

## 2022-05-07 DIAGNOSIS — E790 Hyperuricemia without signs of inflammatory arthritis and tophaceous disease: Secondary | ICD-10-CM

## 2022-05-13 ENCOUNTER — Telehealth: Payer: Self-pay | Admitting: Internal Medicine

## 2022-05-13 DIAGNOSIS — H04123 Dry eye syndrome of bilateral lacrimal glands: Secondary | ICD-10-CM | POA: Diagnosis not present

## 2022-05-13 DIAGNOSIS — H40013 Open angle with borderline findings, low risk, bilateral: Secondary | ICD-10-CM | POA: Diagnosis not present

## 2022-05-13 DIAGNOSIS — Z961 Presence of intraocular lens: Secondary | ICD-10-CM | POA: Diagnosis not present

## 2022-05-13 DIAGNOSIS — H353131 Nonexudative age-related macular degeneration, bilateral, early dry stage: Secondary | ICD-10-CM | POA: Diagnosis not present

## 2022-05-13 DIAGNOSIS — H524 Presbyopia: Secondary | ICD-10-CM | POA: Diagnosis not present

## 2022-05-13 NOTE — Progress Notes (Signed)
  Chronic Care Management   Note  05/13/2022 Name: Angelica Romero MRN: [id number] DOB: 10-Nov-1940  Angelica Romero is a 81 y.o. year old female who is a primary care patient of [name], MD. I reached out to Lyman Bishop by phone today in response to a referral sent by [name] PCP, [name], MD.   [name] was given information about Chronic Care Management services today including:  CCM service includes personalized support from designated clinical staff supervised by her physician, including individualized plan of care and coordination with other care providers 24/7 contact phone numbers for assistance for urgent and routine care needs. Service will only be billed when office clinical staff spend 20 minutes or more in a month to coordinate care. Only one practitioner may furnish and bill the service in a calendar month. The patient may stop CCM services at any time (effective at the end of the month) by phone call to the office staff.   Patient agreed to services and verbal consent obtained.   Follow up [place]

## 2022-05-13 NOTE — Chronic Care Management (AMB) (Signed)
  Chronic Care Management   Outreach Note  05/13/2022 Name: Angelica Romero MRN: [id number] DOB: 1940-10-25  Referred by: [name], MD Reason for referral : No chief complaint on file.   An unsuccessful telephone outreach was attempted today. The patient was referred to the pharmacist for assistance with care management and care coordination.   Follow Up Plan:   Tatjana Dellinger [facility] Scheduler

## 2022-05-14 ENCOUNTER — Telehealth: Payer: Self-pay

## 2022-05-14 NOTE — Telephone Encounter (Signed)
LM-05/14/22-Chart Prep started, Reviewing OV, Consults, Hospital visits, Labs and medication changes.  Chart prep completed. UCC patient data update form complete. Medicare cost analysis not performed d/t pt. Using Angelica Romero.   Total time spent: 41 min.

## 2022-05-19 NOTE — Telephone Encounter (Signed)
LM10/11/23-Called pt. And confirmed CP initial phone call visit for 10/17 at [facility] questions complete. Pt. Has no health concerns and no barriers receiving her medications from her pharmacy. Patient panel updated. Quickly reviewed LFD for pts. Medication adherence.  Total time spent: 10 min.

## 2022-05-19 NOTE — Telephone Encounter (Signed)
LM-05/19/22-Care plan complete for upcoming CP visit on 10/17.   Total time spent: 8 min.

## 2022-05-25 ENCOUNTER — Ambulatory Visit: Payer: Medicare Other | Admitting: Pharmacy Technician

## 2022-05-25 DIAGNOSIS — R7309 Other abnormal glucose: Secondary | ICD-10-CM

## 2022-05-25 DIAGNOSIS — R0989 Other specified symptoms and signs involving the circulatory and respiratory systems: Secondary | ICD-10-CM

## 2022-05-25 DIAGNOSIS — M8580 Other specified disorders of bone density and structure, unspecified site: Secondary | ICD-10-CM

## 2022-05-26 NOTE — Telephone Encounter (Signed)
LM-05/26/22-CP Initial visit notes edited and uploaded to pts. documents.   Total time spent: 3 min.

## 2022-05-26 NOTE — Progress Notes (Signed)
Initial Pharmacist Visit (CCM)  Angelica Romero,Angelica Romero  88 years, Female  DOB: 1941/06/27  M: [phone]  __________________________________________________ Patient's Chronic Conditions: Hypertension (HTN), Osteopenia or Osteoporosis, Hyperlipidemia/Dyslipidemia (HLD), Diabetes (DM) List Other Conditions (separated by comma): Lumbar radiculopathy, DJD, Pubic ramus fracture, Vitamin D deficiency, Hyperuricemia, Prediabetes, Obesity, Systolic murmur   Summary for PCP:  1. She is only taking Colchicine PRN for flares. Takes Allopurinol daily. 2. Working on weight loss. Goal weight loss is ~20 pounds to get to 140 pounds. 3. Counseled on fall prevention. 4. Has got her flu shot.  Disease Assessments Visit Completed on: 05/25/2022 Did patient bring medications to appointment?: No What source (s) was used to reconcile medications this visit?: Verbal recall from patient/caregiver, EMR list  Subjective: Very sweet lady presenting via phone for CCM initial visit. She lives alone with 3 cats and 1 dog. She is retired. Has children, grandchildren, and 9 great grandkids. Husband passed away 10-23-2019. She did with therapy at [facility]. She feels she is doing fairly well. Family is close by and checks on her regularly. She is very active in her church community [employer]).   She had mechanical fall (tripped on cat) in December 2022 and was admitted for left rami fracture, Dr. Marlou Sa recommended conservative therapy, PT and vitamin D. She was also found to be covid 19 positive with pneumonia and underwent remdesivir therapy. She was discharged home on 07/23/2021 with plan for PT/OT, follow up with orthopedic surgery in 3-4 weeks. She reports never had PT, hasn't heard from ortho and never saw. Doesn't want to see them, has been doing gradual exercises at home, declines referral back to ortho or for PT. Xray 09/25/21:  Healing superior and inferior pubic rami fractures on the left as described.  Lifestyle habits: Diet:  limit salt, limit sugar Exercise: None Tobacco: None Alcohol: None Caffeine: Coffee most of the day Recreational drugs: None What is the patient's sleep pattern?: No sleep issues How many hours per night does patient typically sleep?: 8+  SDOH: Accountable Health Communities Health-Related Social Needs Screening Tool ([email or web address]) SDOH questions were documented and reviewed (EMR or Innov[employer]cer) within the past 12 months or since hospitalization?: No What is your living situation today? (ref #1): I have a steady place to live Think about the place you live. Do you have problems with any of the following? (ref #2): None of the above Within the past 12 months, you worried that your food would run out before you got money to buy more (ref #3): Never true Within the past 12 months, the food you bought just didn't last and you didn't have money to get more (ref #4): Never true In the past 12 months, has lack of reliable transportation kept you from medical appointments, meetings, work or from getting things needed for daily living? (ref #5): No In the past 12 months, has the electric, gas, oil, or water company threatened to shut off services in your home? (ref #6): No How often does anyone, including family and friends, physically hurt you? (ref #7): Never (1) How often does anyone, including family and friends, insult or talk down to you? (ref #8): Never (1) How often does anyone, including friends and family, threaten you with harm? (ref #9): Never (1) How often does anyone, including family and friends, scream or curse at you? (ref #10): Never (1)  Medication Adherence Does the [facility] have access to medication refill history?: Yes Medication adherence rates for STAR metric medications:  Telmisartan '80mg'$ -03/05/22 (90DS), 03/15/22 (100DS)  Medication adherence rates for non-STAR metric medications:  Amitriptyline '10mg'$ -09/17/21 (30DS), 01/22/22  (30DS)  Factors that may affect medication adherence?: Lack of understanding / insight of condition Name and location of Current [facility]: [facility] Current Rx insurance plan: UHC Are meds synced by current pharmacy?: No Are meds delivered by current pharmacy?: Yes - by mail order pharmacy Would patient benefit from direct intervention of clinical lead in dispensing process to optimize clinical outcomes?: No Are [facility] pharmacy services available where patient lives?: Yes Is patient disadvantaged to use [facility]?: Yes Does patient experience delays in picking up medications due to transportation concerns (getting to pharmacy)?: No Medication organization: Pill Box Assessment:: Adherent  Hypertension (HTN) Most Recent BP: 124/68 Most Recent HR: 81 taken on: 04/27/2022 Care Gap: Need BP documented or last BP 140/90 or higher: Addressed Assessed today?: Yes BP today is: 110/73 Goal: <130/80 mmHG Is Patient checking BP at home?: Yes Patient home BP readings are ranging: 110s-120s/70s Has patient experienced hypotension, dizziness, falls or bradycardia?: No We discussed: DASH diet:  following a diet emphasizing fruits and vegetables and low-fat dairy products along with whole grains, fish, poultry, and nuts. Reducing red meats and sugars., Reducing the amount of salt intake to '1500mg'$ /per day., Proper Home BP Measurement, Hypertension pathophysiology, complications, treatment goals, and management with patient for 10-15 minutes, Increasing movement, Contacting PCP office for signs and symptoms of high or low blood pressure (hypotension, dizziness, falls, headaches, edema) Assessment:: Controlled Drug: Telmisartan '80mg'$ -1 tab QD Pharmacist Assessment: Appropriate, Effective, Safe, Accessible Plan/Follow up: Continue BP checks at home daily  Hyperlipidemia/Dyslipidemia (HLD) Last Lipid panel on: 04/27/2022 TC (Goal<200): 180 LDL: 97 HDL (Goal>40): 57 TG (Goal<150):  159 ASCVD 10-year risk?is:: N/A due to Age > 94 Assessed today?: No Drug: None Plan/Follow up: Working on weight loss.  Diabetes (DM) Most recent A1C: 5.6 taken on: 01/20/2022 Previous A1C: 5.5 taken on: 09/17/2021 Most Recent GFR: 65 taken on: 04/27/2022 Type: Pre-Diabetes/Impaired Fasting Glucose Assessed today?: Yes Goal A1C: < 6.5 % Type: Pre-Diabetes/Impaired Fasting Glucose We discussed: How to recognize and treat signs of hypoglycemia., Low carbohydrate eating plan with an emphasis on whole grains, legumes, nuts, fruits, and vegetables and minimal refined and processed foods., Proper diabetic foot care including daily foot exams and wearing closed toe shoes., Healthy eating habits for DM with patient for 15-20 minutes, Increasing exercise (walking, biking, swimming) to a goal of 30 minutes per day, as able based on current activity level and health, Scheduling a yearly eye exam, Scheduling a dental appointment twice yearly Assessment:: Controlled Drug: None Plan/Follow up: Working on weight loss  Osteopenia or Osteoporosis Most recent T-score: -1.3 taken on: 06/02/2021 Previous T-score: Unable to locate results taken on: 06/21/2012 Most recent Vitamin D 25-OH: 77 taken on: 01/20/2022 Previous Vitamin D 25-OH: 85 taken on: 01/20/2021 Assessed today?: Yes 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 In the past 12 months, have you fallen?: Yes What were the circumstances?: Tripped over cat How many times have you fallen?: 1 Are there any stairs in or around the home?: No Is the home free of loose throw rugs in walkways, pet beds, electrical cords, etc.?: Yes Is there adequate lighting in your home to reduce the risk of falls?: Yes Prior fractures (include location and date): Pubic Ramus fracture Dietary calcium intake: None, calcium levels high We discussed: Weight bearing exercises (walking, light weights, resistance training), Fall prevention, Vitamin D  supplementation, Limiting caffeine intake Assessment:: Controlled Drug: Vitamin D-Take 5,000U QD Pharmacist  Assessment: Appropriate, Effective, Safe, Accessible  Preventative Health Care Gap: Colorectal cancer screening: Patient excluded from population (Age > 28, hx of colorectal cancer or colectomy, hospice services) Care Gap: Breast cancer screening: Addressed Care Gap: Annual Wellness Visit (AWV): Addressed Immunizations needed: Influenza  Additional exercise counseling points. We discussed: aiming to lose 5 to 10% of body weight through lifestyle modifications, incorporating flexibility, balance, and strength training exercises, decreasing sedentary behavior Additional diet counseling points. We discussed: key components of the DASH diet, aiming to consume at least 8 cups of water day, limiting caffeine intake  Clinical Summary Next CCM Follow Up: 6 months Next AWV: 04/28/23 at 11:00AM Next PCP Visit: 01/25/23 at 2:00PM  CPP Prep: 21mn CPP Televisit: 425m CPP Doc: 32m21mCPP Care Plan: 2mi75mAttestation Statement:: CCM Services:  This encounter meets complex CCM services and moderate to high medical decision making.  Prior to outreach and patient consent for Chronic Care Management, I referred this patient for services after reviewing the nominated patient list or from a personal encounter with the patient.  I have personally reviewed this encounter including the documentation in this note and have collaborated with the care management provider regarding care management and care coordination activities to include development and update of the comprehensive care plan I am certifying that I agree with the content of this note and encounter as supervising physician. Pharmacy Interventions Pharmacist Interventions discussed: Yes Started Therapy: Preventative therapy, Immunizations recommended Monitoring: Overdue labs, Preventative health screenings, Routine monitoring Education:  Lifestyle modifications  AverMarda StalkerarmD Clinical Pharmacist AverNaida Sleightton'@upstream'$ .care [phone]

## 2022-06-15 ENCOUNTER — Telehealth: Payer: Medicare Other | Admitting: Pharmacy Technician

## 2022-06-29 IMAGING — CR DG HIP (WITH OR WITHOUT PELVIS) 2-3V*L*
2 series · 2 of 2 positions shown · non-contrast
Comparison: 07/17/2021

CLINICAL DATA: Follow-up left pubic ramus fracture

EXAM:
DG HIP (WITH OR WITHOUT PELVIS) 2-3V LEFT

[w pelvis upright]
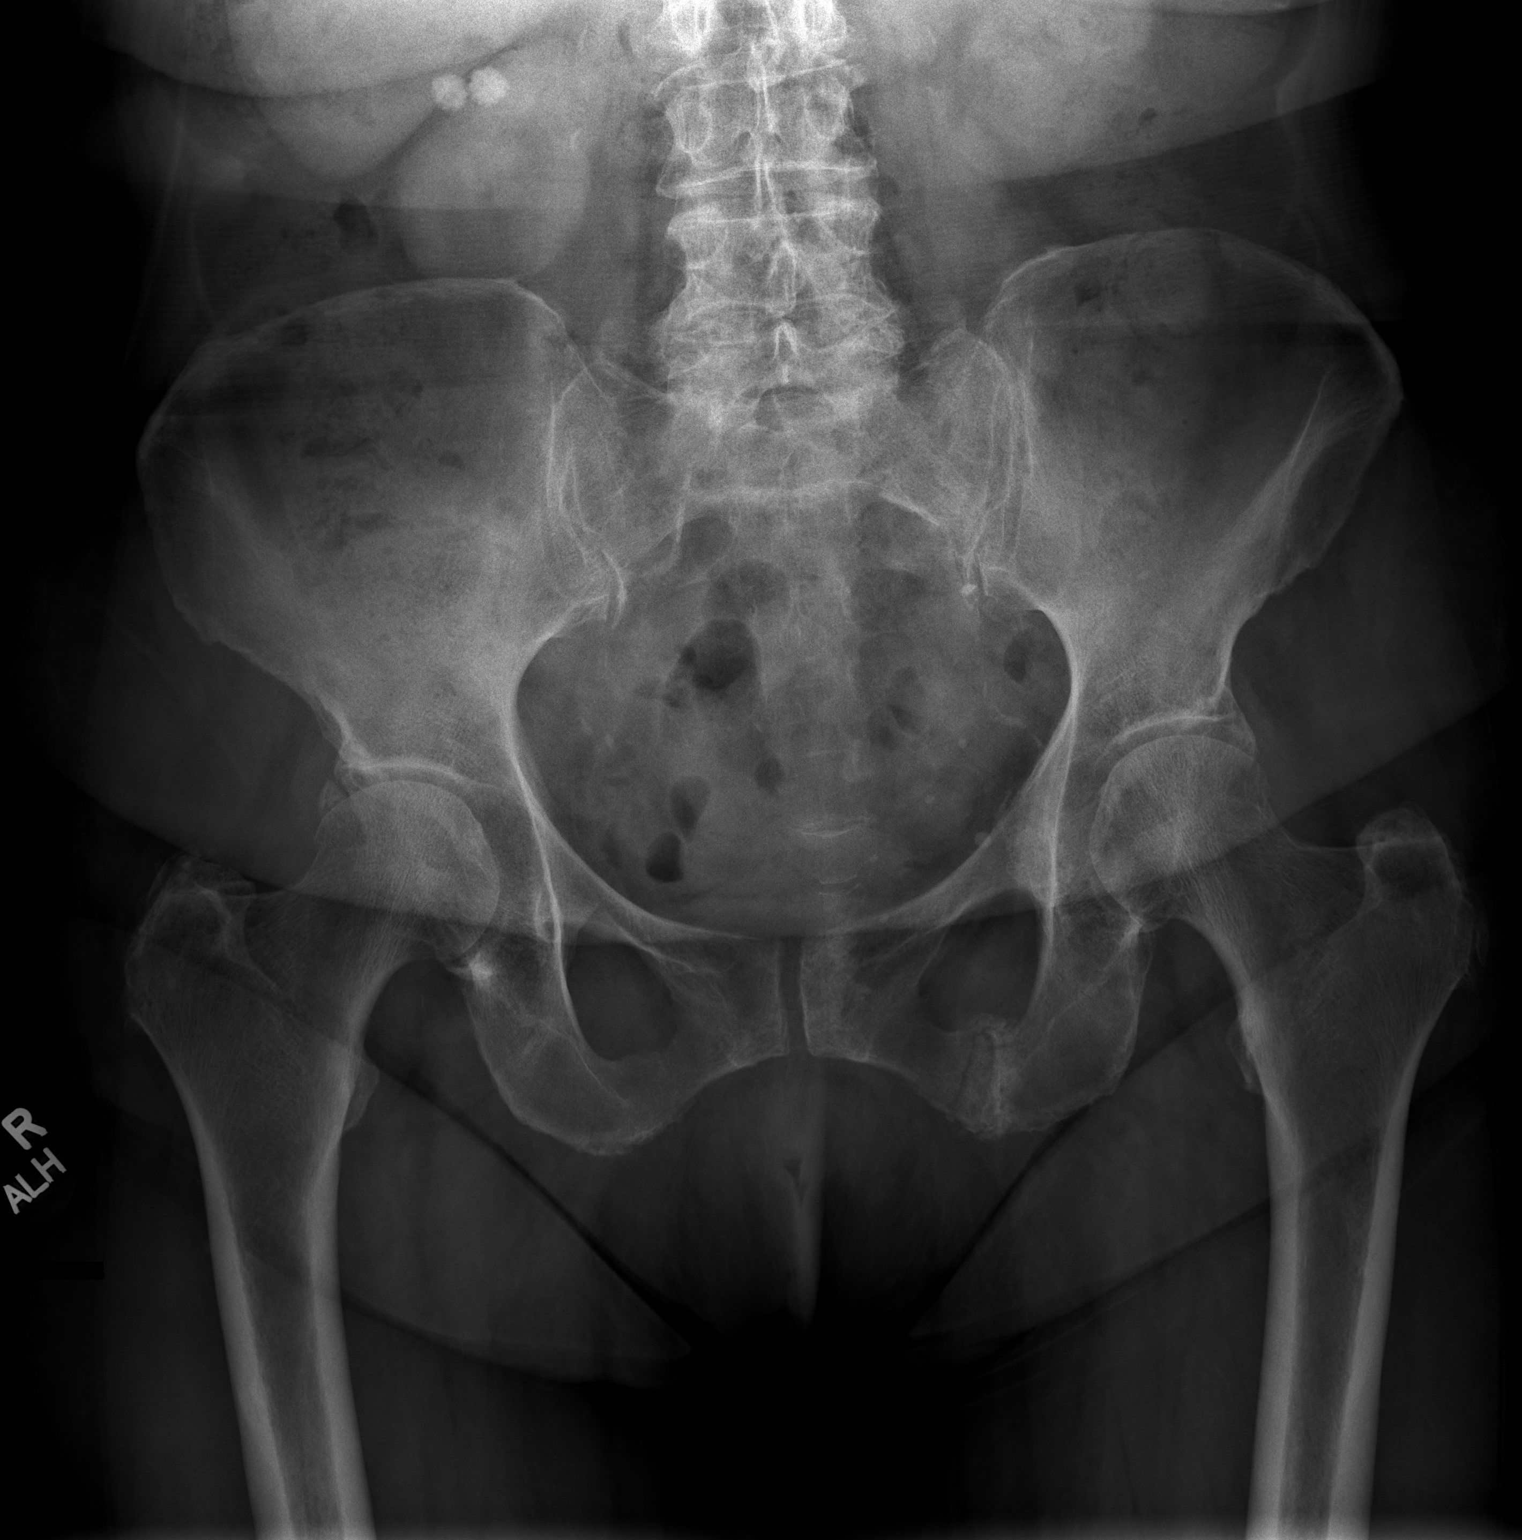

[w hip lat left]
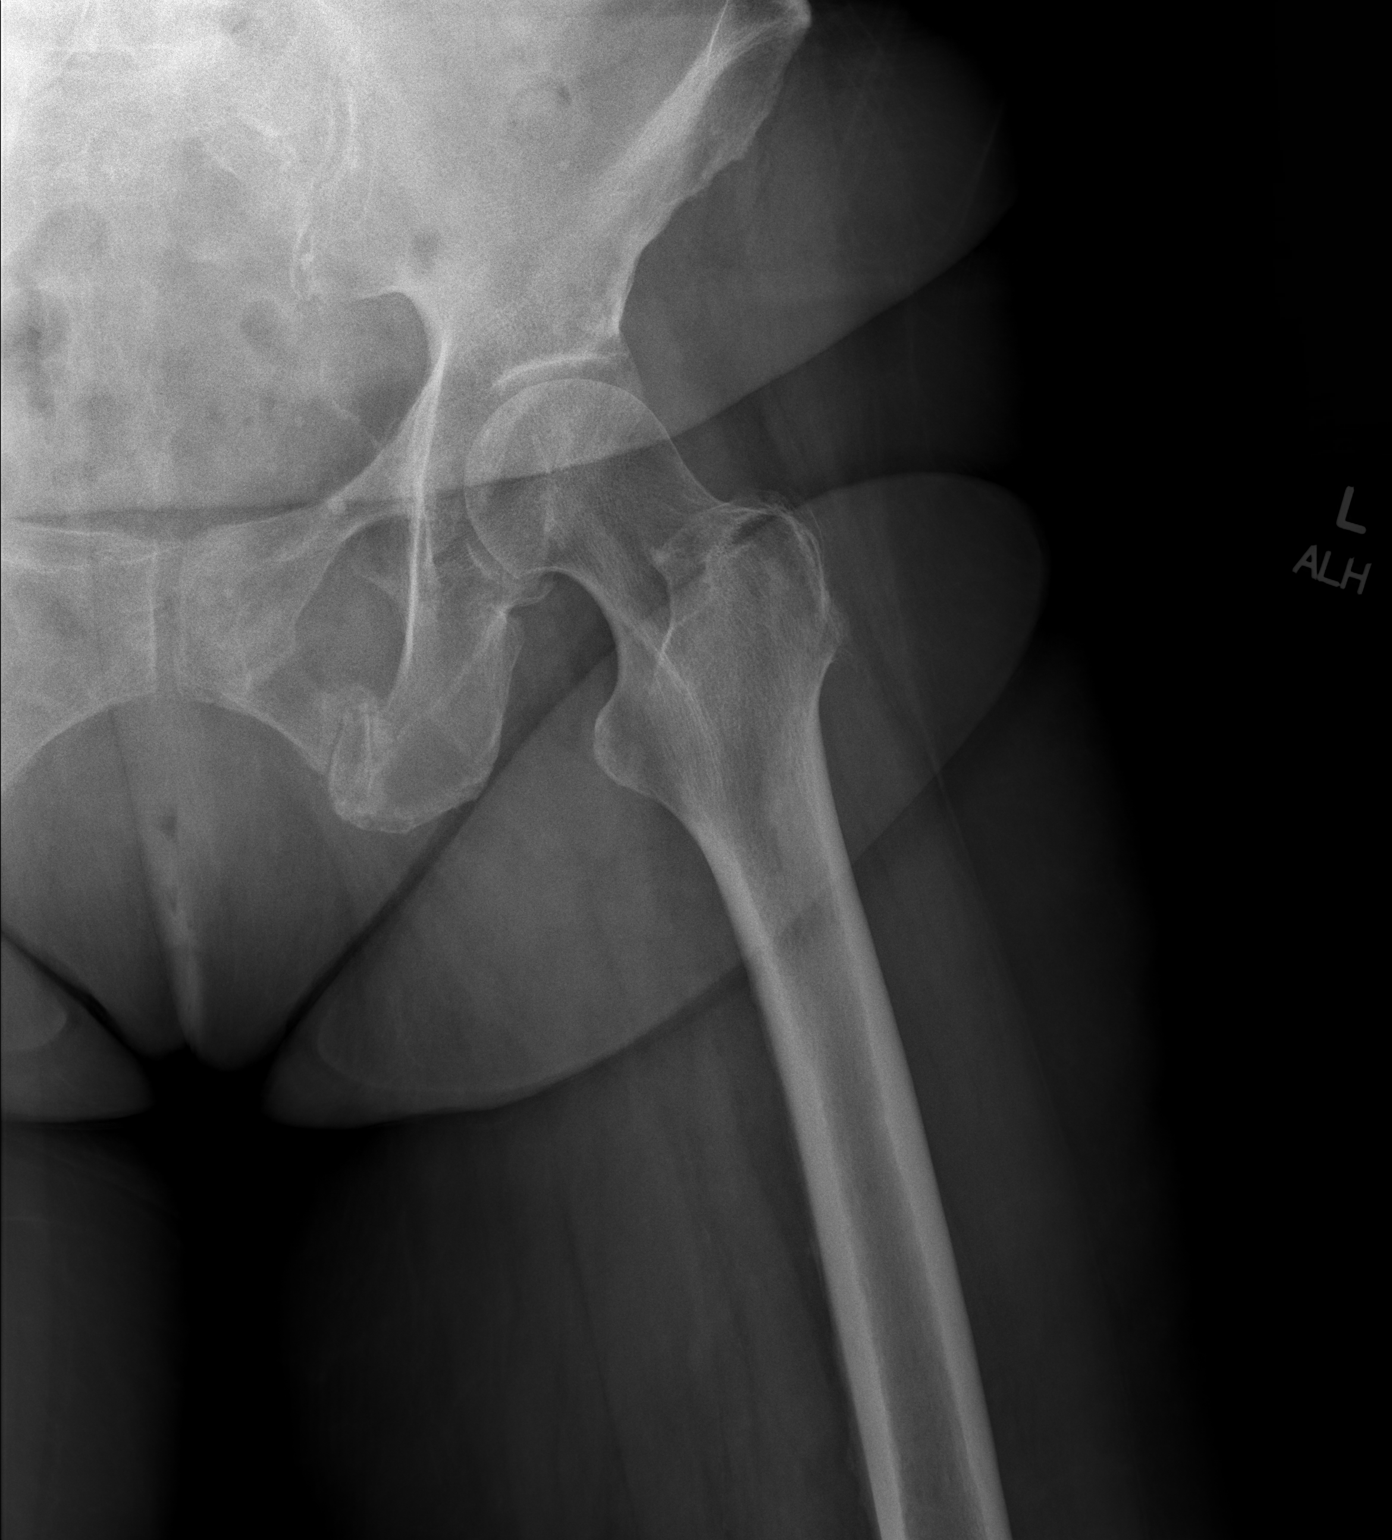

[2 of 2 positions shown; findings below may reference images not displayed]

FINDINGS: Callus formation is noted in the left inferior pubic ramus about the
known fracture. There remains a fracture line present. Additionally
some sclerosis is noted along the lateral aspect of the superior
pubic ramus on the left likely representing some healing as well.
Evidence of cholelithiasis is noted in the right upper quadrant.
IMPRESSION: Healing superior and inferior pubic rami fractures on the left as
described.

Cholelithiasis.

## 2022-07-05 ENCOUNTER — Other Ambulatory Visit: Payer: Self-pay | Admitting: Internal Medicine

## 2022-07-05 DIAGNOSIS — Z1231 Encounter for screening mammogram for malignant neoplasm of breast: Secondary | ICD-10-CM

## 2022-07-14 ENCOUNTER — Other Ambulatory Visit: Payer: Self-pay

## 2022-07-14 ENCOUNTER — Encounter: Payer: Self-pay | Admitting: Internal Medicine

## 2022-07-14 ENCOUNTER — Ambulatory Visit (INDEPENDENT_AMBULATORY_CARE_PROVIDER_SITE_OTHER): Payer: Medicare Other | Admitting: Internal Medicine

## 2022-07-14 VITALS — BP 130/78 | HR 87 | Temp 97.9°F | Resp 18 | Ht 61.0 in | Wt 159.2 lb

## 2022-07-14 DIAGNOSIS — G4483 Primary cough headache: Secondary | ICD-10-CM

## 2022-07-14 DIAGNOSIS — J40 Bronchitis, not specified as acute or chronic: Secondary | ICD-10-CM

## 2022-07-14 DIAGNOSIS — Z1152 Encounter for screening for COVID-19: Secondary | ICD-10-CM | POA: Diagnosis not present

## 2022-07-14 LAB — POC COVID19 BINAXNOW: SARS Coronavirus 2 Ag: NEGATIVE

## 2022-07-14 LAB — POC INFLUENZA A&B (BINAX/QUICKVUE)
Influenza A, POC: NEGATIVE
Influenza B, POC: NEGATIVE

## 2022-07-14 MED ORDER — BENZONATATE 200 MG PO CAPS: ORAL_CAPSULE | ORAL | 1 refills | Status: DC

## 2022-07-14 MED ORDER — AZITHROMYCIN 250 MG PO TABS: ORAL_TABLET | ORAL | 1 refills | Status: DC

## 2022-07-14 MED ORDER — DEXAMETHASONE 4 MG PO TABS: ORAL_TABLET | ORAL | 0 refills | Status: DC

## 2022-07-14 NOTE — Progress Notes (Signed)
    Future Appointments  Date Time Provider Department  07/14/2022 11:00 AM [name], MD [facility]  08/30/2022  1:40 PM [facility] MM 3 [facility]  01/25/2023  2:00 PM [name], MD [facility]  04/28/2023 11:00 AM Alycia Rossetti, NP [facility]    History of Present Illness:       This very nice 81 y.o. [place] with HTN, HLD, Prediabetes,  Gout and Vitamin D Defici presents with  3 week   prodrome of upper /lower respiratory congestion  &  cough    Medications    telmisartan (MICARDIS) 80 MG tablet, Take  1 tablet  Daily     allopurinol (ZYLOPRIM) 300 MG tablet, TAKE 1 TABLET DAILY    aspirin EC 81 MG tablet, Take  daily. Reported on 07/31/2015   colchicine 0.6 MG tablet, Take 1 to 2 tablets  /day to Prevent Gout Flare       amitriptyline (ELAVIL) 10 MG tablet, TAKE 1 TO 2 TABLETS AT BEDTIME AS NEEDED FOR SLEEP    VITAMIN C GUMMIE , Take 1 each  daily.   VITAMIN D , Take 5,000 Units daily.   Milk Thistle 1000 MG CAPS, Take 3000, mg  Milk Thistle, NAC, Chanca, Piedra, Beet RooTudca, and Dandelion   Multiple Vitamin , Take 1 tablet  daily.   topiramate 50 MG tablet, Take 1/2 to 1 tablet 2 x /day at Suppertime & Bedtime for Dieting & Weight Loss  Problem list She has Labile hypertension; Vitamin D deficiency; Hyperlipidemia, mixed; DJD (degenerative joint disease), multiple sites; Hyperuricemia; Abnormal glucose (hx of prediabetes); Obesity (BMI 30.0-34.9); Osteopenia; Pubic ramus fracture ([place]); Systolic murmur; and Lumbar radiculopathy on their problem list.   Observations/Objective:  BP 130/78   Pulse 87   Temp 97.9 F (36.6 C)   Resp 18   Ht '5\' 1"'$  (1.549 m)   Wt 159 lb 3.2 oz (72.2 kg)   SpO2 95%   BMI 30.08 kg/m   Dry cough . Sl hoarse. No stridor.   HEENT - WNL. Neck - supple.  Chest - C Few scattered bilat med dry rales and rhonchi and no wheezes.  Cor - Nl HS. RRR w/o sig MGR.  No edema. MS- FROM w/o deformities.  Gait Nl. Neuro -  Nl w/o focal  abnormalities.  Assessment and Plan:  1. Tracheobronchitis  - dexamethasone  4 MG tablet;  Take 1 tab 3 x day - 3 days, then 2 x day - 3 days, then 1 tab daily   Dispense: 20 tablet  - azithromycin  250 MG tablet;  Take 2 tablets with Food on  Day 1, then 1 tablet Daily  Dispense: 6 each; Refill: 1 - benzonatate 200 MG capsule;  Take 1 perle 3 x / day to prevent cough   Dispense: 30 capsule; Refill: 1  2. Cough headache  - POC COVID-19     - > Negative - POC Influenza A&B (Binax test)  - >  Negative   Follow Up Instructions:        I discussed the assessment and treatment plan with the patient. The patient was provided an opportunity to ask questions and all were answered. The patient agreed with the plan and demonstrated an understanding of the instructions.       The patient was advised to call back or seek an in-person evaluation if the symptoms worsen or if the condition fails to improve as anticipated.   Kirtland Bouchard, MD

## 2022-08-13 DIAGNOSIS — Z08 Encounter for follow-up examination after completed treatment for malignant neoplasm: Secondary | ICD-10-CM | POA: Diagnosis not present

## 2022-08-13 DIAGNOSIS — L570 Actinic keratosis: Secondary | ICD-10-CM | POA: Diagnosis not present

## 2022-08-13 DIAGNOSIS — L538 Other specified erythematous conditions: Secondary | ICD-10-CM | POA: Diagnosis not present

## 2022-08-13 DIAGNOSIS — L298 Other pruritus: Secondary | ICD-10-CM | POA: Diagnosis not present

## 2022-08-13 DIAGNOSIS — D225 Melanocytic nevi of trunk: Secondary | ICD-10-CM | POA: Diagnosis not present

## 2022-08-13 DIAGNOSIS — Z85828 Personal history of other malignant neoplasm of skin: Secondary | ICD-10-CM | POA: Diagnosis not present

## 2022-08-13 DIAGNOSIS — L814 Other melanin hyperpigmentation: Secondary | ICD-10-CM | POA: Diagnosis not present

## 2022-08-13 DIAGNOSIS — L820 Inflamed seborrheic keratosis: Secondary | ICD-10-CM | POA: Diagnosis not present

## 2022-08-13 DIAGNOSIS — L578 Other skin changes due to chronic exposure to nonionizing radiation: Secondary | ICD-10-CM | POA: Diagnosis not present

## 2022-08-13 DIAGNOSIS — L821 Other seborrheic keratosis: Secondary | ICD-10-CM | POA: Diagnosis not present

## 2022-08-23 ENCOUNTER — Other Ambulatory Visit: Payer: Self-pay

## 2022-08-23 MED ORDER — TELMISARTAN 80 MG PO TABS: ORAL_TABLET | ORAL | 3 refills | Status: DC

## 2022-08-30 ENCOUNTER — Ambulatory Visit
Admission: RE | Admit: 2022-08-30 | Discharge: 2022-08-30 | Disposition: A | Discharge status: Home / Self Care | Payer: Medicare Other | Source: Ambulatory Visit | Attending: Internal Medicine | Admitting: Internal Medicine

## 2022-08-30 DIAGNOSIS — Z1231 Encounter for screening mammogram for malignant neoplasm of breast: Secondary | ICD-10-CM

## 2022-09-20 ENCOUNTER — Other Ambulatory Visit: Payer: Self-pay

## 2022-09-20 MED ORDER — ALLOPURINOL 300 MG PO TABS: ORAL_TABLET | ORAL | 3 refills | Status: DC

## 2022-10-06 DIAGNOSIS — I350 Nonrheumatic aortic (valve) stenosis: Secondary | ICD-10-CM | POA: Insufficient documentation

## 2022-10-06 DIAGNOSIS — I10 Essential (primary) hypertension: Secondary | ICD-10-CM | POA: Insufficient documentation

## 2022-10-06 NOTE — Progress Notes (Signed)
Cardiology Office Note   Date:  10/07/2022   ID:  Angelica Romero, Angelica Romero 04-09-1941, MRN [id number]  PCP:  [name], MD  Cardiologist:   None Referring:  [name], MD  Chief Complaint  Patient presents with   Aortic Stenosis      History of Present Illness: Angelica Romero is a 82 y.o. female who is referred by [name], MD for evaluation of a murmur.  She had moderate AS on echo.  Since I last saw her she has had no new symptoms.  The patient denies any new symptoms such as chest discomfort, neck or arm discomfort. There has been no new shortness of breath, PND or orthopnea. There have been no reported palpitations, presyncope or syncope.  She is limited by back pain.    Past Medical History:  Diagnosis Date   Arthritis    Asthma    Complication of anesthesia    "hard to wake up" per patient   Elevated BP    Heart murmur    mild-moderate AS 10/15/21   Prediabetes    Pubic ramus fracture ([place]) 07/17/2021   Vitamin D deficiency     Past Surgical History:  Procedure Laterality Date   APPENDECTOMY     FOOT SURGERY     Lumbar back surgery     LUMBAR LAMINECTOMY/DECOMPRESSION MICRODISCECTOMY Right 10/19/2021   Procedure: Laminectomy and Foraminotomy - right - Lumbar two-Lumbar three - Lumbar three-Lumbar four - Lumbar four-Lumbar five;  Surgeon: Earnie Larsson, MD;  Location: [facility];  Service: Neurosurgery;  Laterality: Right;   TONSILLECTOMY N/A    VAGINAL HYSTERECTOMY       Current Outpatient Medications  Medication Sig Dispense Refill   allopurinol (ZYLOPRIM) 300 MG tablet TAKE 1 TABLET BY MOUTH ONCE DAILY TO PREVENT GOUT 90 tablet 3   amitriptyline (ELAVIL) 10 MG tablet TAKE 1 TO 2 TABLETS BY MOUTH AT BEDTIME AS NEEDED FOR SLEEP (Patient taking differently: Take 10 mg by mouth at bedtime as needed for sleep.) 60 tablet 0   Ascorbic Acid (VITAMIN C GUMMIE PO) Take 1 each by mouth daily.     aspirin EC 81 MG tablet Take 81 mg by mouth daily. Reported on  07/31/2015     azithromycin (ZITHROMAX) 250 MG tablet Take 2 tablets with Food on  Day 1, then 1 tablet Daily with Food for Sinusitis / Bronchitis 6 each 1   benzonatate (TESSALON) 200 MG capsule Take 1 perle 3 x / day to prevent cough 30 capsule 1   Cholecalciferol (VITAMIN D PO) Take 5,000 Units by mouth daily.     colchicine 0.6 MG tablet Take 1 to 2 tablets  /day to Prevent Gout Flare                                                     /                                      TAKE                                      BY  MOUTH 180 tablet 1   Multiple Vitamin (MULTIVITAMIN WITH MINERALS) TABS tablet Take 1 tablet by mouth daily. 30 tablet 0   telmisartan (MICARDIS) 80 MG tablet Take  1 tablet  Daily  for BP. 90 tablet 3   topiramate (TOPAMAX) 50 MG tablet Take 1/2 to 1 tablet 2 x /day at Suppertime & Bedtime for Dieting & Weight Loss 180 tablet 1   No current facility-administered medications for this visit.    Allergies:   Codeine, Hydrocodone, Lisinopril, Prilosec [omeprazole], and Epinephrine    ROS:  Please see the history of present illness.   Otherwise, review of systems are positive for none.   All other systems are reviewed and negative.    PHYSICAL EXAM: VS:  BP 126/64   Pulse 80   Ht '5\' 1"'$  (1.549 m)   Wt 162 lb (73.5 kg)   SpO2 100%   BMI 30.61 kg/m  , BMI Body mass index is 30.61 kg/m. GENERAL:  Well appearing NECK:  No jugular venous distention, waveform within normal limits, carotid upstroke brisk and symmetric, no bruits, no thyromegaly LUNGS:  Clear to auscultation bilaterally CHEST:  Unremarkable HEART:  PMI not displaced or sustained,S1 and S2 within normal limits, no S3, no S4, no clicks, no rubs, no murmurs ABD:  Flat, positive bowel sounds normal in frequency in pitch, no bruits, no rebound, no guarding, no midline pulsatile mass, no hepatomegaly, no splenomegaly EXT:  2 plus pulses throughout, no edema, no cyanosis no  clubbin    EKG:  EKG is  ordered today. The ekg ordered today demonstrates sinus rhythm, rate 61, axis within normal limits, intervals within normal limits, no acute ST-T wave changes.     Recent Labs: 01/20/2022: Magnesium 2.3; TSH 2.13 04/27/2022: ALT 14; BUN 14; Creat 0.88; Hemoglobin 13.2; Platelets 306; Potassium 4.1; Sodium 139    Lipid Panel    Component Value Date/Time   CHOL 180 04/27/2022 0000   TRIG 159 (H) 04/27/2022 0000   HDL 57 04/27/2022 0000   CHOLHDL 3.2 04/27/2022 0000   VLDL 14 09/08/2016 0937   LDLCALC 97 04/27/2022 0000      Wt Readings from Last 3 Encounters:  10/07/22 162 lb (73.5 kg)  07/14/22 159 lb 3.2 oz (72.2 kg)  04/27/22 165 lb 9.6 oz (75.1 kg)      Other studies Reviewed: Additional studies/ records that were reviewed today include: None. Review of the above records demonstrates: NA    ASSESSMENT AND PLAN:  AS:  This was mild to moderate and I will follow this clinically.    I will check an echocardiogram to see if there is been significant regression in the last year.  She has no new symptoms.  We talked about these at length.  I suspect I can follow this clinically.  Hypertension: Her blood pressure is controlled.  No change in therapy.   Current medicines are reviewed at length with the patient today.  The patient does not have concerns regarding medicines.  The following changes have been made:  no change  Labs/ tests ordered today include:   Orders Placed This Encounter  Procedures   ECHOCARDIOGRAM COMPLETE     Disposition:   FU with me in one year.   Signed, Minus Breeding, MD  10/07/2022 12:20 PM    [place]

## 2022-10-07 ENCOUNTER — Encounter: Payer: Self-pay | Admitting: Cardiology

## 2022-10-07 ENCOUNTER — Ambulatory Visit: Payer: Medicare Other | Attending: Cardiology | Admitting: Cardiology

## 2022-10-07 VITALS — BP 126/64 | HR 80 | Ht 61.0 in | Wt 162.0 lb

## 2022-10-07 DIAGNOSIS — I10 Essential (primary) hypertension: Secondary | ICD-10-CM

## 2022-10-07 DIAGNOSIS — I350 Nonrheumatic aortic (valve) stenosis: Secondary | ICD-10-CM

## 2022-10-07 NOTE — Patient Instructions (Signed)
Medication Instructions:  Your physician recommends that you continue on your current medications as directed. Please refer to the Current Medication list given to you today.  *If you need a refill on your cardiac medications before your next appointment, please call your pharmacy*  Testing/Procedures: Your physician has requested that you have an echocardiogram. Echocardiography is a painless test that uses sound waves to create images of your heart. It provides your doctor with information about the size and shape of your heart and how well your heart's chambers and valves are working. This procedure takes approximately one hour. There are no restrictions for this procedure. Please do NOT wear cologne, perfume, aftershave, or lotions (deodorant is allowed). Please arrive 15 minutes prior to your appointment time.  Follow-Up: At [facility], you and your health needs are our priority.  As part of our continuing mission to provide you with exceptional heart care, we have created designated Provider Care Teams.  These Care Teams include your primary Cardiologist (physician) and Advanced Practice Providers (APPs -  Physician Assistants and Nurse Practitioners) who all work together to provide you with the care you need, when you need it.  We recommend signing up for the patient portal called "MyChart".  Sign up information is provided on this After Visit Summary.  MyChart is used to connect with patients for Virtual Visits (Telemedicine).  Patients are able to view lab/test results, encounter notes, upcoming appointments, etc.  Non-urgent messages can be sent to your provider as well.   To learn more about what you can do with MyChart, go to [email or web address].    Your next appointment:   12 month(s)  Provider:   Dr. Percival Spanish

## 2022-10-14 DIAGNOSIS — M5416 Radiculopathy, lumbar region: Secondary | ICD-10-CM | POA: Diagnosis not present

## 2022-10-14 DIAGNOSIS — M48061 Spinal stenosis, lumbar region without neurogenic claudication: Secondary | ICD-10-CM | POA: Diagnosis not present

## 2022-10-14 DIAGNOSIS — R1031 Right lower quadrant pain: Secondary | ICD-10-CM | POA: Diagnosis not present

## 2022-10-14 DIAGNOSIS — M47816 Spondylosis without myelopathy or radiculopathy, lumbar region: Secondary | ICD-10-CM | POA: Diagnosis not present

## 2022-10-15 ENCOUNTER — Telehealth: Payer: Self-pay

## 2022-10-15 NOTE — Progress Notes (Signed)
Reviewing patients chart prior to assessment call. Reviewed office visits, specialists visits, hospital visits, medications, adherence, and labs. Spoke with patient and she states that's he is doing well overall. She has had some increased pain in her back and she has been going to Dr. Trenton Gammon her neurosurgeon to help with that and he is doing scans and things to try to get to the bottom of her increased pain.   Time Spent: 15 min.  Hildred Alamin, CTL

## 2022-10-28 DIAGNOSIS — M5126 Other intervertebral disc displacement, lumbar region: Secondary | ICD-10-CM | POA: Diagnosis not present

## 2022-10-28 DIAGNOSIS — M5416 Radiculopathy, lumbar region: Secondary | ICD-10-CM | POA: Diagnosis not present

## 2022-10-28 DIAGNOSIS — M47816 Spondylosis without myelopathy or radiculopathy, lumbar region: Secondary | ICD-10-CM | POA: Diagnosis not present

## 2022-11-03 DIAGNOSIS — M5416 Radiculopathy, lumbar region: Secondary | ICD-10-CM | POA: Diagnosis not present

## 2022-11-08 ENCOUNTER — Ambulatory Visit (HOSPITAL_COMMUNITY): Payer: Medicare Other | Attending: Cardiovascular Disease

## 2022-11-08 DIAGNOSIS — I350 Nonrheumatic aortic (valve) stenosis: Secondary | ICD-10-CM | POA: Diagnosis not present

## 2022-11-08 LAB — ECHOCARDIOGRAM COMPLETE
AR max vel: 1.2 cm2
AV Area VTI: 1.2 cm2
AV Area mean vel: 1.14 cm2
AV Mean grad: 17.5 mmHg
AV Peak grad: 30.5 mmHg
Ao pk vel: 2.76 m/s
Area-P 1/2: 2.99 cm2
S' Lateral: 2.6 cm

## 2022-11-17 DIAGNOSIS — M5416 Radiculopathy, lumbar region: Secondary | ICD-10-CM | POA: Diagnosis not present

## 2022-11-22 ENCOUNTER — Telehealth: Payer: Self-pay | Admitting: Pharmacist

## 2022-11-22 NOTE — Progress Notes (Signed)
HC focus outreach prep started. Reviewing office visits, specialists visits, hospital visits, adherence, medications, and lab. Confirm visit date: 11/23/22, 3-5pm Have you seen any providers since your last visit or any hospitalizations? 11/03/22- Pool (Neuro)- no full notes available. 10/14/22- Pool (Neuro)- no full notes available. 10/07/22- Hochrein (Cardiology)- no med changes.  12/16/23Oneta Rack (PCP)- started azithromycin 250mg  2 tab day 1 then 1 tab daily for 4 days, started benzonatate 200mg  tid prn, started dexamethasone 4mg  1 tab tid x 3 days, then bid x 3 days, then qd. Have there been any changes to your medications since your last visit? (Med. Adherence, care gaps) LVM Have you had any problems with getting your medications from your pharmacy? (Copay barriers) LVM What is your top health concern you'd like to discuss for your upcoming visit? HC: Last Care Plan date- initial visit was 05/25/2022.  11/22/22- LVM reminding pt of visit and to call back if any changes need to be made or concerns.   Total time: 

## 2022-11-23 ENCOUNTER — Ambulatory Visit: Payer: Self-pay | Admitting: Pharmacist

## 2022-11-23 DIAGNOSIS — M1000 Idiopathic gout, unspecified site: Secondary | ICD-10-CM

## 2022-11-23 DIAGNOSIS — Z79899 Other long term (current) drug therapy: Secondary | ICD-10-CM

## 2022-11-23 NOTE — Progress Notes (Signed)
Focused Pharmacist Outreach     1. Gout- Recent gout flares from high sugar intake; colchicine cleared flares adequately. No further issues or concerns.   2. Denies falls or injuries, issues with UTIs or constipation.   3. No medication changes to date.     CPP Telemedicine Time: 5 min

## 2022-11-24 ENCOUNTER — Encounter: Payer: Self-pay | Admitting: Internal Medicine

## 2022-12-07 DIAGNOSIS — E782 Mixed hyperlipidemia: Secondary | ICD-10-CM | POA: Diagnosis not present

## 2022-12-07 DIAGNOSIS — M8580 Other specified disorders of bone density and structure, unspecified site: Secondary | ICD-10-CM | POA: Diagnosis not present

## 2022-12-07 DIAGNOSIS — R0989 Other specified symptoms and signs involving the circulatory and respiratory systems: Secondary | ICD-10-CM | POA: Diagnosis not present

## 2023-01-21 DIAGNOSIS — H353131 Nonexudative age-related macular degeneration, bilateral, early dry stage: Secondary | ICD-10-CM | POA: Diagnosis not present

## 2023-01-21 DIAGNOSIS — H04123 Dry eye syndrome of bilateral lacrimal glands: Secondary | ICD-10-CM | POA: Diagnosis not present

## 2023-01-21 DIAGNOSIS — H1131 Conjunctival hemorrhage, right eye: Secondary | ICD-10-CM | POA: Diagnosis not present

## 2023-01-25 ENCOUNTER — Encounter: Payer: Self-pay | Admitting: Internal Medicine

## 2023-01-25 ENCOUNTER — Ambulatory Visit (INDEPENDENT_AMBULATORY_CARE_PROVIDER_SITE_OTHER): Payer: Medicare Other | Admitting: Internal Medicine

## 2023-01-25 VITALS — BP 120/80 | HR 80 | Temp 97.9°F | Resp 16 | Ht 61.0 in | Wt 161.0 lb

## 2023-01-25 DIAGNOSIS — R7309 Other abnormal glucose: Secondary | ICD-10-CM

## 2023-01-25 DIAGNOSIS — Z0000 Encounter for general adult medical examination without abnormal findings: Secondary | ICD-10-CM | POA: Diagnosis not present

## 2023-01-25 DIAGNOSIS — Z6835 Body mass index (BMI) 35.0-35.9, adult: Secondary | ICD-10-CM

## 2023-01-25 DIAGNOSIS — E559 Vitamin D deficiency, unspecified: Secondary | ICD-10-CM | POA: Diagnosis not present

## 2023-01-25 DIAGNOSIS — Z0001 Encounter for general adult medical examination with abnormal findings: Secondary | ICD-10-CM

## 2023-01-25 DIAGNOSIS — Z1211 Encounter for screening for malignant neoplasm of colon: Secondary | ICD-10-CM

## 2023-01-25 DIAGNOSIS — Z136 Encounter for screening for cardiovascular disorders: Secondary | ICD-10-CM | POA: Diagnosis not present

## 2023-01-25 DIAGNOSIS — E782 Mixed hyperlipidemia: Secondary | ICD-10-CM | POA: Diagnosis not present

## 2023-01-25 DIAGNOSIS — M1000 Idiopathic gout, unspecified site: Secondary | ICD-10-CM

## 2023-01-25 DIAGNOSIS — R0989 Other specified symptoms and signs involving the circulatory and respiratory systems: Secondary | ICD-10-CM | POA: Diagnosis not present

## 2023-01-25 DIAGNOSIS — Z79899 Other long term (current) drug therapy: Secondary | ICD-10-CM | POA: Diagnosis not present

## 2023-01-25 NOTE — Patient Instructions (Signed)

## 2023-01-25 NOTE — Progress Notes (Signed)
Annual Screening/Preventative Visit & Comprehensive Evaluation &  Examination   Future Appointments  Date Time Provider Department  01/25/2023                  cpe  2:00 PM Lucky Cowboy, MD [facility]  04/28/2023                 wellness 11:00 AM Raynelle Dick, NP [facility]  02/02/2024                 cpe  2:00 PM Lucky Cowboy, MD [facility]        This very nice 82 y.o. [name] presents for a Screening /Preventative Visit & comprehensive evaluation and management of multiple medical co-morbidities.  Patient has been followed for HTN, HLD, Prediabetes  and Vitamin D Deficiency.  Patient has Gout controlled on her meds.  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  She also reports ongoing difficulty with insomnia & tendency to sadness or depressed mood.         In Dec 2022, patient was hospitalized with a post-fall pubic rami fx & Covid Pneumonia treated with Remdesivir.       In March 2023, patient was hospitalized by Dr Julio Sicks for Lumbar radiculopathy  & underwent uncomplicated right-sided lumbar decompressive surgery.          HTN predates circa 2016. Patient's BP has been controlled at home and patient denies any cardiac symptoms as chest pain, palpitations, shortness of breath, dizziness or ankle swelling. Today's BP is at goal - 120/80 .         Patient's hyperlipidemia is not controlled with diet.  Last lipids were at goal:  Lab Results  Component Value Date   CHOL 180 04/27/2022   HDL 57 04/27/2022   LDLCALC 97 04/27/2022   TRIG 159 (H) 04/27/2022   CHOLHDL 3.2 04/27/2022         Patient has  Moderately Obesity (BMI ~ 32 ) and hx/o prediabetes (A1c 5.9% /2016) and patient denies reactive hypoglycemic symptoms, visual blurring, diabetic polys or paresthesias. Last A1c was at goal :  Lab Results  Component Value Date   HGBA1C 5.6 01/20/2022   Wt Readings from Last 3 Encounters:  01/25/23 161 lb (73 kg)  10/07/22 162 lb  (73.5 kg)  07/14/22 159 lb 3.2 oz (72.2 kg)         Finally, patient has history of Vitamin D Deficiency ("33"/2016) and last Vitamin D was at goal :  Lab Results  Component Value Date   VD25OH 77 01/20/2022       Current Outpatient Medications on File Prior to Visit  Medication Sig   allopurinol 300 MG tablet TAKE 1 TABLET DAILY    VITAMIN C GUMMIE  Take 1 gummy daily.   aspirin EC 81 MG tablet Take daily.    VITAMIN D  10,000 Units Take daily.    colchicine 0.6 MG tablet TAKE 1 TABLET  DAILY    telmisartan  80 MG tablet Take 1 tablet  daily for blood pressure   topiramate 50 MG tablet Take 1/2 to 1 tablet 2 x /day at night     Allergies  Allergen Reactions   Codeine Nausea And Vomiting   Hydrocodone Nausea And Vomiting   Lisinopril Cough   Omeprazole Nausea And Vomiting     Past Medical History:  Diagnosis Date   Elevated BP    Prediabetes  Vitamin D deficiency      Health Maintenance  Topic Date Due   COVID-19 Vaccine (1) Never done   Zoster Vaccines- Shingrix (1 of 2) Never done   INFLUENZA VACCINE  03/09/2021   TETANUS/TDAP  07/30/2025   DEXA SCAN  Completed   PNA vac Low Risk Adult  Completed   HPV VACCINES  Aged Out     Immunization History  Administered Date(s) Administered   DT  07/31/2015   DTaP 06/09/2005   Fluad Quad(high Dose ) 05/11/2019   Influenza, High Dose  04/13/2017, 06/10/2020   Influenza 04/09/2014, 05/10/2015, 04/22/2016   Pneumococcal  - 13 07/01/2014   Pneumococcal  - 23 06/10/1999, 06/09/2008   Zoster, Live 06/10/2007    Cologuard - 10/2018 negative    Last MGM - 08/31/2022   No past surgical history on file.   Family History  Problem Relation Age of Onset   Breast cancer Mother      Social History   Tobacco Use   Smoking status: Never   Smokeless tobacco: Never  Substance Use Topics   Alcohol use: No    Alcohol/week: 0.0 standard drinks      ROS Constitutional: Denies fever, chills, weight  loss/gain, headaches, insomnia,  night sweats, and change in appetite. Does c/o fatigue. Eyes: Denies redness, blurred vision, diplopia, discharge, itchy, watery eyes.  ENT: Denies discharge, congestion, post nasal drip, epistaxis, sore throat, earache, hearing loss, dental pain, Tinnitus, Vertigo, Sinus pain, snoring.  Cardio: Denies chest pain, palpitations, irregular heartbeat, syncope, dyspnea, diaphoresis, orthopnea, PND, claudication, edema Respiratory: denies cough, dyspnea, DOE, pleurisy, hoarseness, laryngitis, wheezing.  Gastrointestinal: Denies dysphagia, heartburn, reflux, water brash, pain, cramps, nausea, vomiting, bloating, diarrhea, constipation, hematemesis, melena, hematochezia, jaundice, hemorrhoids Genitourinary: Denies dysuria, frequency, urgency, nocturia, hesitancy, discharge, hematuria, flank pain Breast: Breast lumps, nipple discharge, bleeding.  Musculoskeletal: Denies arthralgia, myalgia, stiffness, Jt. Swelling, pain, limp, and strain/sprain. Denies falls. Skin: Denies puritis, rash, hives, warts, acne, eczema, changing in skin lesion Neuro: No weakness, tremor, incoordination, spasms, paresthesia, pain Psychiatric: Denies confusion, memory loss, sensory loss. Denies Depression. Endocrine: Denies change in weight, skin, hair change, nocturia, and paresthesia, diabetic polys, visual blurring, hyper / hypo glycemic episodes.  Heme/Lymph: No excessive bleeding, bruising, enlarged lymph nodes.  Physical Exam  BP 120/80   Pulse 80   Temp 97.9 F (36.6 C)   Resp 16   Ht 5\' 1"  (1.549 m)   Wt 161 lb (73 kg)   SpO2 99%   BMI 30.42 kg/m   General Appearance: Over nourished, well groomed and in no apparent distress.  Eyes: PERRLA, EOMs, conjunctiva no swelling or erythema, normal fundi and vessels. Sinuses: No frontal/maxillary tenderness ENT/Mouth: EACs patent / TMs  nl. Nares clear without erythema, swelling, mucoid exudates. Oral hygiene is good. No erythema,  swelling, or exudate. Tongue normal, non-obstructing. Tonsils not swollen or erythematous. Hearing normal.  Neck: Supple, thyroid not palpable. No bruits, nodes or JVD. Respiratory: Respiratory effort normal.  BS equal and clear bilateral without rales, rhonci, wheezing or stridor. Cardio: Heart sounds are normal with regular rate and rhythm and no murmurs, rubs or gallops. Peripheral pulses are normal and equal bilaterally without edema. No aortic or femoral bruits. Chest: symmetric with normal excursions and percussion. Breasts: Symmetric, without lumps, nipple discharge, retractions, or fibrocystic changes.  Abdomen: Flat, soft with bowel sounds active. Nontender, no guarding, rebound, hernias, masses, or organomegaly.  Lymphatics: Non tender without lymphadenopathy.  Genitourinary:  Musculoskeletal: Full ROM all peripheral extremities,  joint stability, 5/5 strength, and normal gait. Skin: Warm and dry without rashes, lesions, cyanosis, clubbing or  ecchymosis.  Neuro: Cranial nerves intact, reflexes equal bilaterally. Normal muscle tone, no cerebellar symptoms. Sensation intact.  Pysch: Alert and oriented X 3, normal affect, Insight and Judgment appropriate.    Assessment and Plan  1. Annual Preventative Screening Examination   2. Labile hypertension  - EKG 12-Lead - Urinalysis, Routine w reflex microscopic - Microalbumin / creatinine urine ratio - CBC with Differential/Platelet - COMPLETE METABOLIC PANEL WITH GFR - Magnesium - TSH  3. Hyperlipidemia, mixed  - EKG 12-Lead - Lipid panel - TSH  4. Abnormal glucose  - EKG 12-Lead - Hemoglobin A1c - Insulin, random  5. Vitamin D deficiency  - VITAMIN D 25 Hydroxy   6. Idiopathic chronic gout   - Uric acid  7. Class 2 severe obesity due to excess calories with serious comorbidity  and body mass index (BMI) of 35.0 to 35.9 in adult (HCC)  - TSH  8. Screening for colorectal cancer  - POC Hemoccult Bld  9.  Screening for heart disease  - EKG 12-Lead  10. Medication management  - Urinalysis, Routine w reflex microscopic - Microalbumin / creatinine urine ratio - CBC with Differential/Platelet - COMPLETE METABOLIC PANEL WITH GFR - Magnesium - Lipid panel - TSH - Hemoglobin A1c - Insulin, random - VITAMIN D 25 Hydroxy  - Uric acid         Patient was counseled in prudent diet to achieve/maintain BMI less than 25 for weight control, BP monitoring, regular exercise and medications. Discussed med's effects and SE's. Screening labs and tests as requested with regular follow-up as recommended. Over 40 minutes of exam, counseling, chart review and high complex critical decision making was performed.   Marinus Maw, MD

## 2023-01-26 ENCOUNTER — Other Ambulatory Visit: Payer: Self-pay | Admitting: Internal Medicine

## 2023-01-26 LAB — CBC WITH DIFFERENTIAL/PLATELET
Absolute Monocytes: 453 cells/uL (ref 200–950)
Basophils Absolute: 37 cells/uL (ref 0–200)
Basophils Relative: 0.6 %
Eosinophils Absolute: 130 cells/uL (ref 15–500)
Eosinophils Relative: 2.1 %
HCT: 36.8 % (ref 35.0–45.0)
Hemoglobin: 12.3 g/dL (ref 11.7–15.5)
Lymphs Abs: 2356 cells/uL (ref 850–3900)
MCH: 31.1 pg (ref 27.0–33.0)
MCHC: 33.4 g/dL (ref 32.0–36.0)
MCV: 93.2 fL (ref 80.0–100.0)
MPV: 10.4 fL (ref 7.5–12.5)
Monocytes Relative: 7.3 %
Neutro Abs: 3224 cells/uL (ref 1500–7800)
Neutrophils Relative %: 52 %
Platelets: 249 10*3/uL (ref 140–400)
RBC: 3.95 10*6/uL (ref 3.80–5.10)
RDW: 14.4 % (ref 11.0–15.0)
Total Lymphocyte: 38 %
WBC: 6.2 10*3/uL (ref 3.8–10.8)

## 2023-01-26 LAB — MAGNESIUM: Magnesium: 2.2 mg/dL (ref 1.5–2.5)

## 2023-01-26 LAB — TSH: TSH: 1.53 mIU/L (ref 0.40–4.50)

## 2023-01-26 LAB — COMPLETE METABOLIC PANEL WITH GFR
AG Ratio: 2 (calc) (ref 1.0–2.5)
ALT: 17 U/L (ref 6–29)
AST: 25 U/L (ref 10–35)
Albumin: 4.7 g/dL (ref 3.6–5.1)
Alkaline phosphatase (APISO): 85 U/L (ref 37–153)
BUN: 22 mg/dL (ref 7–25)
CO2: 25 mmol/L (ref 20–32)
Calcium: 10 mg/dL (ref 8.6–10.4)
Chloride: 109 mmol/L (ref 98–110)
Creat: 0.84 mg/dL (ref 0.60–0.95)
Globulin: 2.4 g/dL (calc) (ref 1.9–3.7)
Glucose, Bld: 90 mg/dL (ref 65–99)
Potassium: 4.1 mmol/L (ref 3.5–5.3)
Sodium: 140 mmol/L (ref 135–146)
Total Bilirubin: 0.5 mg/dL (ref 0.2–1.2)
Total Protein: 7.1 g/dL (ref 6.1–8.1)
eGFR: 68 mL/min/{1.73_m2} (ref >=60)

## 2023-01-26 LAB — URIC ACID: Uric Acid, Serum: 3.3 mg/dL (ref 2.5–7.0)

## 2023-01-26 LAB — LIPID PANEL
Cholesterol: 184 mg/dL (ref <200)
HDL: 62 mg/dL (ref >=50)
LDL Cholesterol (Calc): 104 mg/dL (calc) — ABNORMAL HIGH
Non-HDL Cholesterol (Calc): 122 mg/dL (calc) (ref <130)
Total CHOL/HDL Ratio: 3 (calc) (ref <5.0)
Triglycerides: 86 mg/dL (ref <150)

## 2023-01-26 LAB — MICROALBUMIN / CREATININE URINE RATIO
Creatinine, Urine: 78 mg/dL (ref 20–275)
Microalb, Ur: <0.2 mg/dL

## 2023-01-26 LAB — HEMOGLOBIN A1C
Hgb A1c MFr Bld: 5.8 % of total Hgb — ABNORMAL HIGH (ref <5.7)
Mean Plasma Glucose: 120 mg/dL
eAG (mmol/L): 6.6 mmol/L

## 2023-01-26 LAB — URINALYSIS, ROUTINE W REFLEX MICROSCOPIC
Bilirubin Urine: NEGATIVE
Glucose, UA: NEGATIVE
Hgb urine dipstick: NEGATIVE
Ketones, ur: NEGATIVE
Leukocytes,Ua: NEGATIVE
Nitrite: NEGATIVE
Protein, ur: NEGATIVE
Specific Gravity, Urine: 1.017 (ref 1.001–1.035)
pH: 5.5 (ref 5.0–8.0)

## 2023-01-26 LAB — INSULIN, RANDOM: Insulin: 7.4 u[IU]/mL

## 2023-01-26 LAB — VITAMIN D 25 HYDROXY (VIT D DEFICIENCY, FRACTURES): Vit D, 25-Hydroxy: 58 ng/mL (ref 30–100)

## 2023-01-26 NOTE — Progress Notes (Signed)
^<^<^<^<^<^<^<^<^<^<^<^<^<^<^<^<^<^<^<^<^<^<^<^<^<^<^<^<^<^<^<^<^<^<^<^<^ ^>^>^>^>^>^>^>^>^>^>^>>^>^>^>^>^>^>^>^>^>^>^>^>^>^>^>^>^>^>^>^>^>^>^>^>^>  -  Test results slightly outside the reference range are not unusual. If there is anything important, I will review this with you,  otherwise it is considered normal test values.  If you have further questions,  please do not hesitate to contact me at the office or via My Chart.   ^<^<^<^<^<^<^<^<^<^<^<^<^<^<^<^<^<^<^<^<^<^<^<^<^<^<^<^<^<^<^<^<^<^<^<^<^ ^>^>^>^>^>^>^>^>^>^>^>^>^>^>^>^>^>^>^>^>^>^>^>^>^>^>^>^>^>^>^>^>^>^>^>^>^  -  Chol = 184   -  Great   - Very low risk for Heart Attack  / Stroke  ^>^>^>^>^>^>^>^>^>^>^>^>^>^>^>^>^>^>^>^>^>^>^>^>^>^>^>^>^>^>^>^>^>^>^>^>^ ^>^>^>^>^>^>^>^>^>^>^>^>^>^>^>^>^>^>^>^>^>^>^>^>^>^>^>^>^>^>^>^>^>^>^>^>^  -  A1c ( 12 week average blood sugar )  is slightly elevated , So  . . . . . Marland Kitchen   - Avoid Sweets, Candy & White Stuff   - White Rice, White Baker, [place] Flour  - Breads &  Pasta  ^<^<^<^<^<^<^<^<^<^<^<^<^<^<^<^<^<^<^<^<^<^<^<^<^<^<^<^<^<^<^<^<^<^<^<^<^ ^>^>^>^>^>^>^>^>^>^>^>^>^>^>^>^>^>^>^>^>^>^>^>^>^>^>^>^>^>^>^>^>^>^>^>^>^  -   Uric Acid / Gout test is Normal - So you may stop your Colchicine  ^<^<^<^<^<^<^<^<^<^<^<^<^<^<^<^<^<^<^<^<^<^<^<^<^<^<^<^<^<^<^<^<^<^<^<^<^ ^>^>^>^>^>^>^>^>^>^>^>^>^>^>^>^>^>^>^>^>^>^>^>^>^>^>^>^>^>^>^>^>^>^>^>^>^  -  Vitamin D= 58  - OK   ^<^<^<^<^<^<^<^<^<^<^<^<^<^<^<^<^<^<^<^<^<^<^<^<^<^<^<^<^<^<^<^<^<^<^<^<^ ^>^>^>^>^>^>^>^>^>^>^>^>^>^>^>^>^>^>^>^>^>^>^>^>^>^>^>^>^>^>^>^>^>^>^>^>^  -  All Else - CBC - Kidneys - Electrolytes - Liver - Magnesium & Thyroid    - all  Normal / OK  ^<^<^<^<^<^<^<^<^<^<^<^<^<^<^<^<^<^<^<^<^<^<^<^<^<^<^<^<^<^<^<^<^<^<^<^<^ ^>^>^>^>^>^>^>^>^>^>^>^>^>^>^>^>^>^>^>^>^>^>^>^>^>^>^>^>^>^>^>^>^>^>^>^>^

## 2023-01-29 ENCOUNTER — Encounter: Payer: Self-pay | Admitting: Internal Medicine

## 2023-02-22 ENCOUNTER — Other Ambulatory Visit: Payer: Self-pay

## 2023-02-22 DIAGNOSIS — Z1212 Encounter for screening for malignant neoplasm of rectum: Secondary | ICD-10-CM | POA: Diagnosis not present

## 2023-02-22 DIAGNOSIS — Z1211 Encounter for screening for malignant neoplasm of colon: Secondary | ICD-10-CM

## 2023-02-22 LAB — POC HEMOCCULT BLD/STL (HOME/3-CARD/SCREEN)
Card #2 Fecal Occult Blod, POC: NEGATIVE
Card #3 Fecal Occult Blood, POC: NEGATIVE
Fecal Occult Blood, POC: NEGATIVE

## 2023-04-26 ENCOUNTER — Ambulatory Visit: Payer: Medicare Other | Admitting: Adult Health

## 2023-04-27 NOTE — Progress Notes (Unsigned)
MEDICARE ANNUAL WELLNESS VISIT AND FOLLOW UP  Assessment:   Annual Medicare Wellness Visit Due annually  Health maintenance reviewed  Labile hypertension - continue medications- telmisartan 80 mg every day - Continue DASH diet, exercise and monitor at home. Call if greater than 130/80.  -     CBC with Differential/Platelet -     CMP/GFR  Abnormal glucose  Discussed disease and risks Discussed diet/exercise, weight management  Check A1C q78m; CMP for glucose otherwise  Hyperlipidemia -continue to decrease fatty foods, increase activity.  -     Lipid panel  Vitamin D deficiency Continue supplementation  Obesity with co morbidities - BMI 32 - long discussion about weight loss, diet, and exercise - goal for slow weight loss <155 lb per patient set goal  - Continue Topamax , monitor for side effects  DJD, multiple sites Controlled, ortho/neuro following  Hyperuricemia On allopurinol 150 mg, had SE with 300 mg, uses colchicine PRN flares Discussed diet/lifestyle; discussed colchicine to hold and start if future gout flares  Osteopenia - get dexa in 2 years, continue Vit D and Ca, weight bearing exercises - DEXA Ordered  Medication management Medications reviewed and Questions answered  - Magnesium  Closed fracture of ramus of left pubis (HCC) Patient reports managing at home, declines referral back to ortho  Discussed follow up xray at 12 weeks, order placed  If good healing an discuss PT if needed, patient is optimistic about gradually increasing exercises herself, declines at this time  Systolic murmur 3/6 systolic, patient denies sx, persistent since hospitalization per patient, has not had ECHO, patient is interested in pursuing due to persistence; cardiology referral placed.  Neuropathy Has Gabapentin at home but does not relieve Continue to monitor, if worsens will refer to neuro   Over 30 minutes of face to face interview, exam, counseling, chart review,  and critical decision making was performed  Future Appointments  Date Time Provider Department Center  07/28/2023 11:30 AM Lucky Cowboy, MD [facility] None  10/27/2023 10:30 AM Raynelle Dick, NP [facility] None  02/02/2024  2:00 PM Lucky Cowboy, MD [facility] None  05/01/2024 11:00 AM Raynelle Dick, NP [facility] None    Plan:   During the course of the visit the patient was educated and counseled about appropriate screening and preventive services including:   Pneumococcal vaccine  Influenza vaccine Td vaccine Prevnar 13 Screening electrocardiogram Screening mammography Bone densitometry screening Colorectal cancer screening Diabetes screening Glaucoma screening Nutrition counseling  Advanced directives: given info/requested copies   Subjective:   Angelica Romero is a 82 y.o. female who presents for Medicare Annual Wellness Visit and 3 month follow up on hypertension, prediabetes, hyperlipidemia, vitamin D def and morbid obesity.  Husband passed away 11-13-2019. She did with therapist at [facility]. She feels she is doing fairly well. Living by herself now, has cats and dogs to keep her company. Family is close by and checks on her regularly.  She had mechanical fall (tripped on cat) in December 2022 and was admitted for left rami fracture, Dr. August Saucer recommended conservative therapy, PT and vitamin D. She was also found to be covid 19 positive with pneumonia and underwent remdesivir therapy. She was discharged home on 07/23/2021 with plan for PT/OT, follow up with orthopedic surgery in 3-4 weeks. She reports never had PT, hasn't heard from ortho and never saw. Doesn't want to see them, has been doing gradual exercises at home, declines referral back to ortho or for PT, plans to start at  home after 12 weeks. We discussed follow up xray which she is agreeable to schedule. Doing some chair exercises for RLE. Occasional use of Tylenol. She follows with Dr. Skip Estimable and do suggest  possible fusion  She does have hx of osteopenia, last 06/02/2021 with left fem neck T -1.4.   BMI is Body mass index is 30.76 kg/m., she has been working on diet, stops eating after sundown, watching portions. Active around her yard but not intentionally active.  Wt Readings from Last 3 Encounters:  04/28/23 162 lb 12.8 oz (73.8 kg)  01/25/23 161 lb (73 kg)  10/07/22 162 lb (73.5 kg)   Her blood pressure has been controlled at home, today their BP is BP: 102/62  BP Readings from Last 3 Encounters:  04/28/23 102/62  01/25/23 120/80  10/07/22 126/64  She does workout. She denies chest pain, shortness of breath, dizziness. She is doing chair exercise and works on balance  She is not on cholesterol medication and denies myalgias. Her cholesterol is at goal. The cholesterol last visit was:   Lab Results  Component Value Date   CHOL 184 01/25/2023   HDL 62 01/25/2023   LDLCALC 104 (H) 01/25/2023   TRIG 86 01/25/2023   CHOLHDL 3.0 01/25/2023   She has hx of prediabetes improved to normal at last check. She reports that she has been working on diet and exercise.  Lab Results  Component Value Date   HGBA1C 5.8 (H) 01/25/2023   Last GFR Lab Results  Component Value Date   EGFR 68 01/25/2023    Patient is on Vitamin D supplement, takes 86578 IU but not daily  Lab Results  Component Value Date   VD25OH 48 01/25/2023     Patient is on allopurinol (takes 1/2 tab only due to interance, takes colchicine if needed.) for gout and does  not report a recent flare.  Lab Results  Component Value Date   LABURIC 3.3 01/25/2023      Medication Review Current Outpatient Medications on File Prior to Visit  Medication Sig Dispense Refill   allopurinol (ZYLOPRIM) 300 MG tablet Take 300 mg by mouth daily.     Ascorbic Acid (VITAMIN C GUMMIE PO) Take 1 each by mouth daily.     aspirin EC 81 MG tablet Take 81 mg by mouth daily. Reported on 07/31/2015     Cholecalciferol (VITAMIN D PO) Take  5,000 Units by mouth daily.     colchicine 0.6 MG tablet Take 1 to 2 tablets  /day to Prevent Gout Flare                                                     /                                      TAKE                                      BY                               MOUTH 180 tablet 1  Multiple Vitamin (MULTIVITAMIN WITH MINERALS) TABS tablet Take 1 tablet by mouth daily. 30 tablet 0   telmisartan (MICARDIS) 80 MG tablet Take  1 tablet  Daily  for BP. 90 tablet 3   No current facility-administered medications on file prior to visit.    Allergies: Allergies  Allergen Reactions   Codeine Nausea And Vomiting   Hydrocodone Nausea And Vomiting   Lisinopril Cough   Prilosec [Omeprazole] Nausea And Vomiting   Epinephrine Palpitations    Irregular heart rate    Current Problems (verified) has Labile hypertension; Vitamin D deficiency; Hyperlipidemia, mixed; DJD (degenerative joint disease), multiple sites; Hyperuricemia; Abnormal glucose; Obesity (BMI 30.0-34.9); Osteopenia; Pubic ramus fracture (HCC); Systolic murmur; Lumbar radiculopathy; Nonrheumatic aortic valve stenosis; and Essential hypertension on their problem list.  Screening Tests Immunization History  Administered Date(s) Administered   DT (Pediatric) 07/31/2015   DTaP 06/09/2005   Fluad Quad(high Dose 65+) 05/11/2019, 04/21/2021   Influenza, High Dose Seasonal PF 04/13/2017, 06/10/2020   Influenza-Unspecified 04/09/2014, 05/10/2015, 04/22/2016   Pneumococcal Conjugate-13 07/01/2014   Pneumococcal-Unspecified 06/10/1999, 06/09/2008   Zoster, Live 06/10/2007   Health Maintenance  Topic Date Due   INFLUENZA VACCINE  03/10/2023   COVID-19 Vaccine (1) 05/14/2023 (Originally 11/14/1945)   Zoster Vaccines- Shingrix (1 of 2) 07/28/2023 (Originally 11/13/1959)   DEXA SCAN  06/03/2023   Medicare Annual Wellness (AWV)  04/27/2024   MAMMOGRAM  08/30/2024   DTaP/Tdap/Td (3 - Tdap) 07/30/2025   HPV VACCINES  Aged Out   Pneumonia  Vaccine 32+ Years old  Discontinued    Last colonoscopy: cologuard - 10/2018 negative - DONE Last mammogram: annually at [facility] center 08/2022 DEXA: 06/02/2021 osteopenia - T -1.4, due to repeat yesterday  Names of Other Physician/Practitioners you currently use: 1. [facility]- here for primary care 2. Dr. Elpidio Galea, eye doctor, 01/2023 3. Dr. Maurice March, dentist, last visit 2024  Patient Care Team: Lucky Cowboy, MD as PCP - General (Internal Medicine) Mateo Flow, MD as Consulting Physician (Ophthalmology) Cherlyn Roberts, MD as Consulting Physician (Dermatology) Sundra Aland, [facility] (Inactive) as Pharmacist (Pharmacist)  Surgical: She  has a past surgical history that includes Lumbar back surgery; Vaginal hysterectomy; Foot surgery; Tonsillectomy (N/A); Appendectomy; and Lumbar laminectomy/decompression microdiscectomy (Right, 10/19/2021). Family Her family history includes Breast cancer in her mother; Heart attack (age of onset: 30) in her father; Heart attack (age of onset: 74) in her mother. Social history  She reports that she has never smoked. She has never used smokeless tobacco. She reports that she does not drink alcohol and does not use drugs.  MEDICARE WELLNESS OBJECTIVES: Physical activity: chair exercise  Cardiac risk factors: Cardiac Risk Factors include: advanced age (>62men, >64 women);dyslipidemia;hypertension;obesity (BMI >30kg/m2);sedentary lifestyle Depression/mood screen:      04/28/2023   11:35 AM  Depression screen PHQ 2/9  Decreased Interest 0  Down, Depressed, Hopeless 0  PHQ - 2 Score 0    ADLs:     04/28/2023   11:33 AM 01/25/2023    3:11 AM  In your present state of health, do you have any difficulty performing the following activities:  Hearing? 0 0  Vision? 0 0  Difficulty concentrating or making decisions? 0 0  Walking or climbing stairs? 1 0  Dressing or bathing? 0 0  Doing errands, shopping? 0 0      Cognitive Testing  Alert? Yes  Normal Appearance?Yes  Oriented to person? Yes  Place? Yes   Time? Yes  Recall of three objects?  Yes  Can perform simple calculations? Yes  Displays appropriate judgment?Yes  Can read the correct time from a watch face?Yes  EOL planning: Does Patient Have a Medical Advance Directive?: Yes Type of Advance Directive: Healthcare Power of Attorney, Living will Does patient want to make changes to medical advance directive?: No - Patient declined Copy of Healthcare Power of Attorney in Chart?: No - copy requested   Objective:   Today's Vitals   04/28/23 1103  BP: 102/62  Pulse: 71  Temp: (!) 97.5 F (36.4 C)  SpO2: 97%  Weight: 162 lb 12.8 oz (73.8 kg)  Height: 5\' 1"  (1.549 m)     Body mass index is 30.76 kg/m.  General appearance: alert, no distress, WD/WN,  female HEENT: normocephalic, sclerae anicteric, TMs pearly, nares patent, no discharge or erythema, pharynx normal Oral cavity: MMM, no lesions Neck: supple, no lymphadenopathy, no thyromegaly, no masses Heart: RRR, normal S1, S2, 3/6 decreshendo systolic murmur R 2nd ICS Lungs: CTA bilaterally, no wheezes, rhonchi, or rales Abdomen: +bs, soft, non tender, non distended, no masses, no hepatomegaly, no splenomegaly Musculoskeletal: Strength 5/5 slow antalgic gait Extremities: no edema, no cyanosis, no clubbing Pulses: 2+ symmetric, upper and lower extremities, normal cap refill Neurological: alert, oriented x 3, CN2-12 intact, strength normal upper extremities and lower extremities, sensation normal throughout, DTRs 2+ throughout, no cerebellar signs, gait slow steady  Psychiatric: normal affect, behavior normal, pleasant   Medicare Attestation I have personally reviewed: The patient's medical and social history Their use of alcohol, tobacco or illicit drugs Their current medications and supplements The patient's functional ability including ADLs,fall risks, home safety risks,  cognitive, and hearing and visual impairment Diet and physical activities Evidence for depression or mood disorders  The patient's weight, height, BMI, and visual acuity have been recorded in the chart.  I have made referrals, counseling, and provided education to the patient based on review of the above and I have provided the patient with a written personalized care plan for preventive services.     Raynelle Dick, NP   04/28/2023

## 2023-04-28 ENCOUNTER — Encounter: Payer: Self-pay | Admitting: Nurse Practitioner

## 2023-04-28 ENCOUNTER — Ambulatory Visit (INDEPENDENT_AMBULATORY_CARE_PROVIDER_SITE_OTHER): Payer: Medicare Other | Admitting: Nurse Practitioner

## 2023-04-28 VITALS — BP 102/62 | HR 71 | Temp 97.5°F | Ht 61.0 in | Wt 162.8 lb

## 2023-04-28 DIAGNOSIS — E782 Mixed hyperlipidemia: Secondary | ICD-10-CM | POA: Diagnosis not present

## 2023-04-28 DIAGNOSIS — R0989 Other specified symptoms and signs involving the circulatory and respiratory systems: Secondary | ICD-10-CM

## 2023-04-28 DIAGNOSIS — G629 Polyneuropathy, unspecified: Secondary | ICD-10-CM

## 2023-04-28 DIAGNOSIS — Z0001 Encounter for general adult medical examination with abnormal findings: Secondary | ICD-10-CM

## 2023-04-28 DIAGNOSIS — Z0000 Encounter for general adult medical examination without abnormal findings: Secondary | ICD-10-CM

## 2023-04-28 DIAGNOSIS — E559 Vitamin D deficiency, unspecified: Secondary | ICD-10-CM

## 2023-04-28 DIAGNOSIS — R011 Cardiac murmur, unspecified: Secondary | ICD-10-CM

## 2023-04-28 DIAGNOSIS — E790 Hyperuricemia without signs of inflammatory arthritis and tophaceous disease: Secondary | ICD-10-CM

## 2023-04-28 DIAGNOSIS — Z6835 Body mass index (BMI) 35.0-35.9, adult: Secondary | ICD-10-CM

## 2023-04-28 DIAGNOSIS — R6889 Other general symptoms and signs: Secondary | ICD-10-CM

## 2023-04-28 DIAGNOSIS — M153 Secondary multiple arthritis: Secondary | ICD-10-CM

## 2023-04-28 DIAGNOSIS — S32592A Other specified fracture of left pubis, initial encounter for closed fracture: Secondary | ICD-10-CM

## 2023-04-28 DIAGNOSIS — Z23 Encounter for immunization: Secondary | ICD-10-CM | POA: Diagnosis not present

## 2023-04-28 DIAGNOSIS — E2839 Other primary ovarian failure: Secondary | ICD-10-CM

## 2023-04-28 DIAGNOSIS — Z79899 Other long term (current) drug therapy: Secondary | ICD-10-CM | POA: Diagnosis not present

## 2023-04-28 DIAGNOSIS — M8580 Other specified disorders of bone density and structure, unspecified site: Secondary | ICD-10-CM

## 2023-04-28 DIAGNOSIS — R7309 Other abnormal glucose: Secondary | ICD-10-CM

## 2023-04-28 MED ORDER — TOPIRAMATE 50 MG PO TABS: ORAL_TABLET | ORAL | 1 refills | Status: AC

## 2023-04-28 NOTE — Patient Instructions (Signed)
Neuropathic Pain Neuropathic pain is pain caused by damage to the nerves that are responsible for certain sensations in your body (sensory nerves). Neuropathic pain can make you more sensitive to pain. Even a minor sensation can feel very painful. This is usually a long-term (chronic) condition that can be difficult to treat. The type of pain differs from person to person. It may: Start suddenly (acute), or it may develop slowly and become chronic. Come and go as damaged nerves heal, or it may stay at the same level for years. Cause emotional distress, loss of sleep, and a lower quality of life. What are the causes? The most common cause of this condition is diabetes. Many other diseases and conditions can also cause neuropathic pain. Causes of neuropathic pain can be classified as: Toxic. This is caused by medicines and chemicals. The most common causes of toxic neuropathic pain is damage from medicines that kill cancer cells (chemotherapy) or alcohol abuse. Metabolic. This can be caused by: Diabetes. Lack of vitamins like B12. Traumatic. Any injury that cuts, crushes, or stretches a nerve can cause damage and pain. Compression-related. If a sensory nerve gets trapped or compressed for a long period of time, the blood supply to the nerve can be cut off. Vascular. Many blood vessel diseases can cause neuropathic pain by decreasing blood supply and oxygen to nerves. Autoimmune. This type of pain results from diseases in which the body's defense system (immune system) mistakenly attacks sensory nerves. Examples of autoimmune diseases that can cause neuropathic pain include lupus and multiple sclerosis. Infectious. Many types of viral infections can damage sensory nerves and cause pain. Shingles infection is a common cause of this type of pain. Inherited. Neuropathic pain can be a symptom of many diseases that are passed down through families (genetic). What increases the risk? You are more likely to  develop this condition if: You have diabetes. You smoke. You drink too much alcohol. You are taking certain medicines, including chemotherapy or medicines that treat immune system disorders. What are the signs or symptoms? The main symptom is pain. Neuropathic pain is often described as: Burning. Shock-like. Stinging. Hot or cold. Itching. How is this diagnosed? No single test can diagnose neuropathic pain. It is diagnosed based on: A physical exam and your symptoms. Your health care provider will ask you about your pain. You may be asked to use a pain scale to describe how bad your pain is. Tests. These may be done to see if you have a cause and location of any nerve damage. They include: Nerve conduction studies and electromyography to test how well nerve signals travel through your nerves and muscles (electrodiagnostic testing). Skin biopsy to evaluate for small fiber neuropathy. Imaging studies, such as: X-rays. CT scan. MRI. How is this treated? Treatment for neuropathic pain may change over time. You may need to try different treatment options or a combination of treatments. Some options include: Treating the underlying cause of the neuropathy, such as diabetes, kidney disease, or vitamin deficiencies. Stopping medicines that can cause neuropathy, such as chemotherapy. Medicine to relieve pain. Medicines may include: Prescription or over-the-counter pain medicine. Anti-seizure medicine. Antidepressant medicines. Pain-relieving patches or creams that are applied to painful areas of skin. A medicine to numb the area (local anesthetic), which can be injected as a nerve block. Transcutaneous nerve stimulation. This uses electrical currents to block painful nerve signals. The treatment is painless. Alternative treatments, such as: Acupuncture. Meditation. Massage. Occupational or physical therapy. Pain management programs. Counseling. Follow  these instructions at  home: Medicines  Take over-the-counter and prescription medicines only as told by your health care provider. Ask your health care provider if the medicine prescribed to you: Requires you to avoid driving or using machinery. Can cause constipation. You may need to take these actions to prevent or treat constipation: Drink enough fluid to keep your urine pale yellow. Take over-the-counter or prescription medicines. Eat foods that are high in fiber, such as beans, whole grains, and fresh fruits and vegetables. Limit foods that are high in fat and processed sugars, such as fried or sweet foods. Lifestyle  Have a good support system at home. Consider joining a chronic pain support group. Do not use any products that contain nicotine or tobacco. These products include cigarettes, chewing tobacco, and vaping devices, such as e-cigarettes. If you need help quitting, ask your health care provider. Do not drink alcohol. General instructions Learn as much as you can about your condition. Work closely with all your health care providers to find the treatment plan that works best for you. Ask your health care provider what activities are safe for you. Keep all follow-up visits. This is important. Contact a health care provider if: Your pain treatments are not working. You are having side effects from your medicines. You are struggling with tiredness (fatigue), mood changes, depression, or anxiety. Get help right away if: You have thoughts of hurting yourself. Get help right away if you feel like you may hurt yourself or others, or have thoughts about taking your own life. Go to your nearest emergency room or: Call 911. Call the National Suicide Prevention Lifeline at 7650667355 or 988. This is open 24 hours a day. Text the Crisis Text Line at 415-470-6083. Summary Neuropathic pain is pain caused by damage to the nerves that are responsible for certain sensations in your body (sensory  nerves). Neuropathic pain may come and go as damaged nerves heal, or it may stay at the same level for years. Neuropathic pain is usually a long-term condition that can be difficult to treat. Consider joining a chronic pain support group. This information is not intended to replace advice given to you by your health care provider. Make sure you discuss any questions you have with your health care provider. Document Revised: 03/23/2021 Document Reviewed: 03/23/2021 Elsevier Patient Education  2024 [employer].

## 2023-04-29 LAB — COMPLETE METABOLIC PANEL WITH GFR
AG Ratio: 1.8 (calc) (ref 1.0–2.5)
ALT: 17 U/L (ref 6–29)
AST: 26 U/L (ref 10–35)
Albumin: 4.6 g/dL (ref 3.6–5.1)
Alkaline phosphatase (APISO): 92 U/L (ref 37–153)
BUN: 22 mg/dL (ref 7–25)
CO2: 25 mmol/L (ref 20–32)
Calcium: 10.4 mg/dL (ref 8.6–10.4)
Chloride: 108 mmol/L (ref 98–110)
Creat: 0.93 mg/dL (ref 0.60–0.95)
Globulin: 2.5 g/dL (calc) (ref 1.9–3.7)
Glucose, Bld: 89 mg/dL (ref 65–99)
Potassium: 4.9 mmol/L (ref 3.5–5.3)
Sodium: 140 mmol/L (ref 135–146)
Total Bilirubin: 0.4 mg/dL (ref 0.2–1.2)
Total Protein: 7.1 g/dL (ref 6.1–8.1)
eGFR: 61 mL/min/{1.73_m2} (ref >=60)

## 2023-04-29 LAB — LIPID PANEL
Cholesterol: 188 mg/dL (ref <200)
HDL: 60 mg/dL (ref >=50)
LDL Cholesterol (Calc): 111 mg/dL (calc) — ABNORMAL HIGH
Non-HDL Cholesterol (Calc): 128 mg/dL (calc) (ref <130)
Total CHOL/HDL Ratio: 3.1 (calc) (ref <5.0)
Triglycerides: 81 mg/dL (ref <150)

## 2023-04-29 LAB — CBC WITH DIFFERENTIAL/PLATELET
Absolute Monocytes: 532 cells/uL (ref 200–950)
Basophils Absolute: 39 cells/uL (ref 0–200)
Basophils Relative: 0.7 %
Eosinophils Absolute: 123 cells/uL (ref 15–500)
Eosinophils Relative: 2.2 %
HCT: 40 % (ref 35.0–45.0)
Hemoglobin: 13.3 g/dL (ref 11.7–15.5)
Lymphs Abs: 2464 cells/uL (ref 850–3900)
MCH: 31.9 pg (ref 27.0–33.0)
MCHC: 33.3 g/dL (ref 32.0–36.0)
MCV: 95.9 fL (ref 80.0–100.0)
MPV: 10.6 fL (ref 7.5–12.5)
Monocytes Relative: 9.5 %
Neutro Abs: 2442 cells/uL (ref 1500–7800)
Neutrophils Relative %: 43.6 %
Platelets: 267 10*3/uL (ref 140–400)
RBC: 4.17 10*6/uL (ref 3.80–5.10)
RDW: 14.3 % (ref 11.0–15.0)
Total Lymphocyte: 44 %
WBC: 5.6 10*3/uL (ref 3.8–10.8)

## 2023-04-29 LAB — MAGNESIUM: Magnesium: 2.3 mg/dL (ref 1.5–2.5)

## 2023-05-02 ENCOUNTER — Other Ambulatory Visit: Payer: Self-pay | Admitting: Internal Medicine

## 2023-05-16 ENCOUNTER — Other Ambulatory Visit: Payer: Self-pay | Admitting: Nurse Practitioner

## 2023-05-16 DIAGNOSIS — Z1231 Encounter for screening mammogram for malignant neoplasm of breast: Secondary | ICD-10-CM

## 2023-05-16 DIAGNOSIS — E2839 Other primary ovarian failure: Secondary | ICD-10-CM

## 2023-05-30 DIAGNOSIS — Z961 Presence of intraocular lens: Secondary | ICD-10-CM | POA: Diagnosis not present

## 2023-05-30 DIAGNOSIS — H04123 Dry eye syndrome of bilateral lacrimal glands: Secondary | ICD-10-CM | POA: Diagnosis not present

## 2023-05-30 DIAGNOSIS — H40013 Open angle with borderline findings, low risk, bilateral: Secondary | ICD-10-CM | POA: Diagnosis not present

## 2023-05-30 DIAGNOSIS — H353131 Nonexudative age-related macular degeneration, bilateral, early dry stage: Secondary | ICD-10-CM | POA: Diagnosis not present

## 2023-05-31 ENCOUNTER — Other Ambulatory Visit: Payer: Self-pay | Admitting: Nurse Practitioner

## 2023-07-28 ENCOUNTER — Ambulatory Visit (INDEPENDENT_AMBULATORY_CARE_PROVIDER_SITE_OTHER): Payer: Medicare Other | Admitting: Internal Medicine

## 2023-07-28 ENCOUNTER — Encounter: Payer: Self-pay | Admitting: Internal Medicine

## 2023-07-28 VITALS — BP 110/60 | HR 70 | Temp 97.9°F | Resp 16 | Ht 61.0 in | Wt 164.3 lb

## 2023-07-28 DIAGNOSIS — R7309 Other abnormal glucose: Secondary | ICD-10-CM

## 2023-07-28 DIAGNOSIS — M1000 Idiopathic gout, unspecified site: Secondary | ICD-10-CM | POA: Diagnosis not present

## 2023-07-28 DIAGNOSIS — R0989 Other specified symptoms and signs involving the circulatory and respiratory systems: Secondary | ICD-10-CM | POA: Diagnosis not present

## 2023-07-28 DIAGNOSIS — E782 Mixed hyperlipidemia: Secondary | ICD-10-CM | POA: Diagnosis not present

## 2023-07-28 DIAGNOSIS — E559 Vitamin D deficiency, unspecified: Secondary | ICD-10-CM

## 2023-07-28 DIAGNOSIS — Z79899 Other long term (current) drug therapy: Secondary | ICD-10-CM

## 2023-07-28 NOTE — Patient Instructions (Addendum)
Due to recent changes in healthcare laws, you may see the results of your imaging and laboratory studies on MyChart before your provider has had a chance to review them.  We understand that in some cases there may be results that are confusing or concerning to you. Not all laboratory results come back in the same time frame and the provider may be waiting for multiple results in order to interpret others.  Please give [place] 48 hours in order for your provider to thoroughly review all the results before contacting the office for clarification of your results.  ++++++++++++++++++++++++++  Vit D  & Vit C 1,000 mg   are recommended to help protect  against the Covid-19 and other Corona viruses.    Also it's recommended  to take  Zinc 50 mg  x 1/2 tablet = 25 mg  / day  to help  protect against the Covid-19   and best place to get  is also on [employer].[email or web address]  and don't pay more than 6-8 cents /pill !   +++++++++++++++++++++++++++++++++++++++ Recommend Adult Low Dose Aspirin or  coated  Aspirin 81 mg daily  To reduce risk of Colon Cancer 40 %,  Skin Cancer 26 % ,  Melanoma 46%  and  Pancreatic cancer 60% +++++++++++++++++++++++++++++++++++++++++ Vitamin D goal  is between 70-100.  Please make sure that you are taking your Vitamin D as directed.  It is very important as a natural anti-inflammatory  helping hair, skin, and nails, as well as reducing stroke and heart attack risk.  It helps your bones and helps with mood. It also decreases numerous cancer risks so please take it as directed.  Low Vit D is associated with a 200-300% higher risk for CANCER  and 200-300% higher risk for HEART   ATTACK  &  STROKE.   .....................................Marland Kitchen It is also associated with higher death rate at younger ages,  autoimmune diseases like Rheumatoid arthritis, Lupus, Multiple Sclerosis.    Also many other serious conditions, like depression, Alzheimer's Dementia, infertility, muscle aches,  fatigue, fibromyalgia - just to name a few. +++++++++++++++++++++++++++++++++++++++++ Recommend the book "The END of DIETING" by Dr Monico Hoar  & the book "The END of DIABETES " by Dr Monico Hoar At [email or web address] - get book & Audio CD's    Being diabetic has a  300% increased risk for heart attack, stroke, cancer, and alzheimer- type vascular dementia. It is very important that you work harder with diet by avoiding all foods that are white. Avoid white rice (brown & wild rice is OK), white potatoes (sweetpotatoes in moderation is OK), White bread or wheat bread or anything made out of white flour like bagels, donuts, rolls, buns, biscuits, cakes, pastries, cookies, pizza crust, and pasta (made from white flour & egg whites) - vegetarian pasta or spinach or wheat pasta is OK. Multigrain breads like Arnold's or [employer], or multigrain sandwich thins or flatbreads.  Diet, exercise and weight loss can reverse and cure diabetes in the early stages.  Diet, exercise and weight loss is very important in the control and prevention of complications of diabetes which affects every system in your body, ie. Brain - dementia/stroke, eyes - glaucoma/blindness, heart - heart attack/heart failure, kidneys - dialysis, stomach - gastric paralysis, intestines - malabsorption, nerves - severe painful neuritis, circulation - gangrene & loss of a leg(s), and finally cancer and Alzheimers.    I recommend avoid fried & greasy foods,  sweets/candy, white rice (brown or wild rice or Quinoa  is OK), white potatoes (sweet potatoes are OK) - anything made from white flour - bagels, doughnuts, rolls, buns, biscuits,white and wheat breads, pizza crust and traditional pasta made of white flour & egg white(vegetarian pasta or spinach or wheat pasta is OK).  Multi-grain bread is OK - like multi-grain flat bread or sandwich thins. Avoid alcohol in excess. Exercise is also important.    Eat all the vegetables you want - avoid meat,  especially red meat and dairy - especially cheese.  Cheese is the most concentrated form of trans-fats which is the worst thing to clog up our arteries. Veggie cheese is OK which can be found in the fresh produce section at [employer] or [employer] or [employer]  +++++++++++++++++++++++++++++++++++++++ DASH Eating Plan  DASH stands for "Dietary Approaches to Stop Hypertension."   The DASH eating plan is a healthy eating plan that has been shown to reduce high blood pressure (hypertension). Additional health benefits may include reducing the risk of type 2 diabetes mellitus, heart disease, and stroke. The DASH eating plan may also help with weight loss. WHAT DO I NEED TO KNOW ABOUT THE DASH EATING PLAN? For the DASH eating plan, you will follow these general guidelines: Choose foods with a percent daily value for sodium of less than 5% (as listed on the food label). Use salt-free seasonings or herbs instead of table salt or sea salt. Check with your health care provider or pharmacist before using salt substitutes. Eat lower-sodium products, often labeled as "lower sodium" or "no salt added." Eat fresh foods. Eat more vegetables, fruits, and low-fat dairy products. Choose whole grains. Look for the word "whole" as the first word in the ingredient list. Choose fish  Limit sweets, desserts, sugars, and sugary drinks. Choose heart-healthy fats. Eat veggie cheese  Eat more home-cooked food and less restaurant, buffet, and fast food. Limit fried foods. Cook foods using methods other than frying. Limit canned vegetables. If you do use them, rinse them well to decrease the sodium. When eating at a restaurant, ask that your food be prepared with less salt, or no salt if possible.                      WHAT FOODS CAN I EAT? Read Dr Francis Dowse Fuhrman's books on The End of Dieting & The End of Diabetes  Grains Whole grain or whole wheat bread. Brown rice. Whole grain or whole wheat pasta. Quinoa,  bulgur, and whole grain cereals. Low-sodium cereals. Corn or whole wheat flour tortillas. Whole grain cornbread. Whole grain crackers. Low-sodium crackers.  Vegetables Fresh or frozen vegetables (raw, steamed, roasted, or grilled). Low-sodium or reduced-sodium tomato and vegetable juices. Low-sodium or reduced-sodium tomato sauce and paste. Low-sodium or reduced-sodium canned vegetables.   Fruits All fresh, canned (in natural juice), or frozen fruits.  Protein Products  All fish and seafood.  Dried beans, peas, or lentils. Unsalted nuts and seeds. Unsalted canned beans.  Dairy Low-fat dairy products, such as skim or 1% milk, 2% or reduced-fat cheeses, low-fat ricotta or cottage cheese, or plain low-fat yogurt. Low-sodium or reduced-sodium cheeses.  Fats and Oils Tub margarines without trans fats. Light or reduced-fat mayonnaise and salad dressings (reduced sodium). Avocado. Safflower, olive, or canola oils. Natural peanut or almond butter.  Other Unsalted popcorn and pretzels. The items listed above may not be a complete list of recommended foods or beverages. Contact your dietitian for more options.  +++++++++++++++  WHAT FOODS ARE NOT RECOMMENDED? Grains/ White flour  or wheat flour White bread. White pasta. White rice. Refined cornbread. Bagels and croissants. Crackers that contain trans fat.  Vegetables  Creamed or fried vegetables. Vegetables in a . Regular canned vegetables. Regular canned tomato sauce and paste. Regular tomato and vegetable juices.  Fruits Dried fruits. Canned fruit in light or heavy syrup. Fruit juice.  Meat and Other Protein Products Meat in general - RED meat & White meat.  Fatty cuts of meat. Ribs, chicken wings, all processed meats as bacon, sausage, bologna, salami, fatback, hot dogs, bratwurst and packaged luncheon meats.  Dairy Whole or 2% milk, cream, half-and-half, and cream cheese. Whole-fat or sweetened yogurt. Full-fat cheeses or blue cheese.  Non-dairy creamers and whipped toppings. Processed cheese, cheese spreads, or cheese curds.  Condiments Onion and garlic salt, seasoned salt, table salt, and sea salt. Canned and packaged gravies. Worcestershire sauce. Tartar sauce. Barbecue sauce. Teriyaki sauce. Soy sauce, including reduced sodium. Steak sauce. Fish sauce. Oyster sauce. Cocktail sauce. Horseradish. Ketchup and mustard. Meat flavorings and tenderizers. Bouillon cubes. Hot sauce. Tabasco sauce. Marinades. Taco seasonings. Relishes.  Fats and Oils Butter, stick margarine, lard, shortening and bacon fat. Coconut, palm kernel, or palm oils. Regular salad dressings.  Pickles and olives. Salted popcorn and pretzels.  The items listed above may not be a complete list of foods and beverages to avoid.

## 2023-07-28 NOTE — Progress Notes (Signed)
[facility] [place]      ADULT   &   ADOLESCENT      INTERNAL MEDICINE  Lucky Cowboy, M.D.          Rance Muir, ANP        Adela Glimpse, FNP  [facility] [address]  [place], [place]. [address] Telephone [phone] Telefax [phone]    Future Appointments  Date Time Provider Department  07/28/2023                    6 mo ov 11:30 AM Lucky Cowboy, MD [facility]  10/20/2023 11:40 AM Rollene Rotunda, MD [facility]  10/27/2023                   wellness 10:30 AM Raynelle Dick, NP [facility]  02/02/2024                   cpe  2:00 PM Lucky Cowboy, MD [facility]  05/01/2024                   3 mo ov 11:00 AM Raynelle Dick, NP [facility]      History of Present Illness:       This very nice 82 y.o.  [name] presents for 6 month follow up with HTN, HLD, Pre-Diabetes and Vitamin D Deficiency. Patient has hx/o Gout controlled on her meds. Patient has chronic pain consequent of her Osteoarthritis.        Patient is treated for HTN ( 2016) & BP has been controlled at home. Today's BP is at goal - 110/60 . Patient has had no complaints of any cardiac type chest pain, palpitations, dyspnea Angelica Romero /PND, dizziness, claudication   or dependent edema.        Hyperlipidemia is  not controlled with diet . Last Lipids were at goal:  Lab Results  Component Value Date   CHOL 188 04/28/2023   HDL 60 04/28/2023   LDLCALC 111 (H) 04/28/2023   TRIG 81 04/28/2023   CHOLHDL 3.1 04/28/2023     Also, the patient has Moderate Obesity (BMI 32.56) and consequent PreDiabetes (A1c 5.9% /2016) and has had no symptoms of reactive hypoglycemia, diabetic polys, paresthesias or visual blurring.  Last A1c was not at goal:  Lab Results  Component Value Date   HGBA1C 5.8 (H) 01/25/2023            Further, the patient also has history of Vitamin D Deficiency ("33"/2016)  and supplements vitamin D without any suspected side-effects. Last vitamin D was sl low  (goal 70-100):  Lab  Results  Component Value Date   VD25OH 58 01/25/2023       Current Outpatient Medications  Medication Instructions   allopurinol  300 MG tablet TAKE 1 TABLET DAILY    VITAMIN C GUMMIE  1 each  Daily   aspirin EC  81 mg Daily   VITAMIN D 5,000 Units Daily   colchicine 0.6 MG tablet Take 1 to 2 tablets  /day to Prevent Gout Flare    Multiple Vitamin 1 tablet, Oral, Daily   telmisartan  80 MG tablet TAKE 1 TABLET DAILY    topiramate  50 MG tablet Take 1/2 to 1 tablet 2 x /day at Suppertime & Bedtime     Allergies  Allergen Reactions   Codeine Nausea And Vomiting   Hydrocodone Nausea And Vomiting   Lisinopril Cough    cough   Omeprazole Nausea And Vomiting  PMHx:   Past Medical History:  Diagnosis Date   Elevated BP    Prediabetes    Vitamin D deficiency      Immunization History  Administered Date(s) Administered   DT (Pediatric) 07/31/2015   DTaP 06/09/2005   Influenza, High Dose  04/13/2017   Influenza 04/09/2014, 05/10/2015, 04/22/2016   Pneumococcal -13 07/01/2014   Pneumococcal-23 06/10/1999, 06/09/2008   Zoster 06/10/2007     Past Surgical History:  Procedure Laterality Date   APPENDECTOMY     FOOT SURGERY     Lumbar back surgery     LUMBAR LAMINECTOMY/DECOMPRESSION MICRODISCECTOMY Right 10/19/2021   Procedure: Laminectomy and Foraminotomy - right - Lumbar two-Lumbar three - Lumbar three-Lumbar four - Lumbar four-Lumbar five;  Surgeon: Julio Sicks, MD;  Location: [facility] OR;  Service: Neurosurgery;  Laterality: Right;   TONSILLECTOMY N/A    VAGINAL HYSTERECTOMY      FHx:    Reviewed / unchanged  SHx:    Reviewed / unchanged    Systems Review:  Constitutional: Denies fever, chills, wt changes, headaches, insomnia, fatigue, night sweats, change in appetite. Eyes: Denies redness, blurred vision, diplopia, discharge, itchy, watery eyes.  ENT: Denies discharge, congestion, post nasal drip, epistaxis, sore throat, earache, hearing loss, dental pain,  tinnitus, vertigo, sinus pain, snoring.  CV: Denies chest pain, palpitations, irregular heartbeat, syncope, dyspnea, diaphoresis, orthopnea, PND, claudication or edema. Respiratory: denies cough, dyspnea, DOE, pleurisy, hoarseness, laryngitis, wheezing.  Gastrointestinal: Denies dysphagia, odynophagia, heartburn, reflux, water brash, abdominal pain or cramps, nausea, vomiting, bloating, diarrhea, constipation, hematemesis, melena, hematochezia  or hemorrhoids. Genitourinary: Denies dysuria, frequency, urgency, nocturia, hesitancy, discharge, hematuria or flank pain. Musculoskeletal: Denies arthralgias, myalgias, stiffness, jt. swelling, pain, limping or strain/sprain.  Skin: Denies pruritus, rash, hives, warts, acne, eczema or change in skin lesion(s). Neuro: No weakness, tremor, incoordination, spasms, paresthesia or pain. Psychiatric: Denies confusion, memory loss or sensory loss. Endo: Denies change in weight, skin or hair change.  Heme/Lymph: No excessive bleeding, bruising or enlarged lymph nodes.  Physical Exam  BP 110/60   Pulse 70   Temp 97.9 F (36.6 C)   Resp 16   Ht 5\' 1"  (1.549 m)   Wt 164 lb 4.8 oz (74.5 kg)   SpO2 98%   BMI 31.04 kg/m   Appears  well nourished, well groomed  and in no distress.  Eyes: PERRLA, EOMs, conjunctiva no swelling or erythema. Sinuses: No frontal/maxillary tenderness ENT/Mouth: EAC's clear, TM's nl w/o erythema, bulging. Nares clear w/o erythema, swelling, exudates. Oropharynx clear without erythema or exudates. Oral hygiene is good. Tongue normal, non obstructing. Hearing intact.  Neck: Supple. Thyroid not palpable. Car 2+/2+ without bruits, nodes or JVD. Chest: Respirations nl with BS clear & equal w/o rales, rhonchi, wheezing or stridor.  Cor: Heart sounds normal w/ regular rate and rhythm without sig. murmurs, gallops, clicks or rubs. Peripheral pulses normal and equal  without edema.  Abdomen: Soft & bowel sounds normal. Non-tender w/o  guarding, rebound, hernias, masses or organomegaly.  Lymphatics: Unremarkable.  Musculoskeletal: Full ROM all peripheral extremities, joint stability, 5/5 strength and normal gait.  Skin: Warm, dry without exposed rashes, lesions or ecchymosis apparent.  Neuro: Cranial nerves intact, reflexes equal bilaterally. Sensory-motor testing grossly intact. Tendon reflexes grossly intact.  Pysch: Alert & oriented x 3.  Insight and judgement nl & appropriate. No ideations.  Assessment and Plan:   1. Labile hypertension (Primary)  - monitor blood pressure   - Continue DASH diet.  Reminder to go to  the ER if any CP,  SOB, nausea, dizziness, severe HA, changes vision/speech.     - CBC with Differential/Platelet - COMPLETE METABOLIC PANEL WITH GFR - Magnesium - TSH   2. Hyperlipidemia, mixed  - Continue diet/meds, exercise,& lifestyle modifications.  - Continue monitor periodic cholesterol/liver & renal functions    - Lipid panel - TSH   3. Abnormal glucose   - Continue diet, exercise, lifestyle modifications.  - Monitor appropriate labs.    - Hemoglobin A1c - Insulin, random   4. Vitamin D deficiency  - Continue supplementation.    - VITAMIN D 25 Hydroxy    5. Idiopathic gout  - Uric acid   6. Medication management  - CBC with Differential/Platelet - COMPLETE METABOLIC PANEL WITH GFR - Magnesium - Lipid panel - TSH - Hemoglobin A1c - Insulin, random - VITAMIN D 25 Hydroxy  - Uric acid        Discussed  regular exercise, BP monitoring, weight control to achieve/maintain BMI less than 25 and discussed med and SE's. Recommended labs to assess and monitor clinical status with further disposition pending results of labs.  I discussed the assessment and treatment plan with the patient. The patient was provided an opportunity to ask questions and all were answered. The patient agreed with the plan and demonstrated an understanding of the instructions.  I provided over 30  minutes of exam, counseling, chart review and  complex critical decision making.      The patient was advised to call back or seek an in-person evaluation if the symptoms worsen or if the condition fails to improve as anticipated.   Marinus Maw, MD

## 2023-07-29 LAB — COMPLETE METABOLIC PANEL WITH GFR
AG Ratio: 1.7 (calc) (ref 1.0–2.5)
ALT: 16 U/L (ref 6–29)
AST: 24 U/L (ref 10–35)
Albumin: 4.4 g/dL (ref 3.6–5.1)
Alkaline phosphatase (APISO): 78 U/L (ref 37–153)
BUN: 21 mg/dL (ref 7–25)
CO2: 26 mmol/L (ref 20–32)
Calcium: 10.1 mg/dL (ref 8.6–10.4)
Chloride: 108 mmol/L (ref 98–110)
Creat: 0.94 mg/dL (ref 0.60–0.95)
Globulin: 2.6 g/dL (ref 1.9–3.7)
Glucose, Bld: 89 mg/dL (ref 65–99)
Potassium: 4.5 mmol/L (ref 3.5–5.3)
Sodium: 141 mmol/L (ref 135–146)
Total Bilirubin: 0.4 mg/dL (ref 0.2–1.2)
Total Protein: 7 g/dL (ref 6.1–8.1)
eGFR: 60 mL/min/{1.73_m2} (ref >=60)

## 2023-07-29 LAB — CBC WITH DIFFERENTIAL/PLATELET
Absolute Lymphocytes: 2115 {cells}/uL (ref 850–3900)
Absolute Monocytes: 455 {cells}/uL (ref 200–950)
Basophils Absolute: 40 {cells}/uL (ref 0–200)
Basophils Relative: 0.8 %
Eosinophils Absolute: 150 {cells}/uL (ref 15–500)
Eosinophils Relative: 3 %
HCT: 38 % (ref 35.0–45.0)
Hemoglobin: 12.5 g/dL (ref 11.7–15.5)
MCH: 31.5 pg (ref 27.0–33.0)
MCHC: 32.9 g/dL (ref 32.0–36.0)
MCV: 95.7 fL (ref 80.0–100.0)
MPV: 10.9 fL (ref 7.5–12.5)
Monocytes Relative: 9.1 %
Neutro Abs: 2240 {cells}/uL (ref 1500–7800)
Neutrophils Relative %: 44.8 %
Platelets: 251 10*3/uL (ref 140–400)
RBC: 3.97 10*6/uL (ref 3.80–5.10)
RDW: 14.3 % (ref 11.0–15.0)
Total Lymphocyte: 42.3 %
WBC: 5 10*3/uL (ref 3.8–10.8)

## 2023-07-29 LAB — TSH: TSH: 1.66 m[IU]/L (ref 0.40–4.50)

## 2023-07-29 LAB — INSULIN, RANDOM: Insulin: 9 u[IU]/mL

## 2023-07-29 LAB — LIPID PANEL
Cholesterol: 174 mg/dL (ref <200)
HDL: 70 mg/dL (ref >=50)
LDL Cholesterol (Calc): 90 mg/dL
Non-HDL Cholesterol (Calc): 104 mg/dL (ref <130)
Total CHOL/HDL Ratio: 2.5 (calc) (ref <5.0)
Triglycerides: 55 mg/dL (ref <150)

## 2023-07-29 LAB — MAGNESIUM: Magnesium: 2.3 mg/dL (ref 1.5–2.5)

## 2023-07-29 LAB — HEMOGLOBIN A1C
Hgb A1c MFr Bld: 5.6 %{Hb} (ref <5.7)
Mean Plasma Glucose: 114 mg/dL
eAG (mmol/L): 6.3 mmol/L

## 2023-07-29 LAB — URIC ACID: Uric Acid, Serum: 3 mg/dL (ref 2.5–7.0)

## 2023-07-29 LAB — VITAMIN D 25 HYDROXY (VIT D DEFICIENCY, FRACTURES): Vit D, 25-Hydroxy: 48 ng/mL (ref 30–100)

## 2023-07-29 NOTE — Progress Notes (Signed)
[][][][][][][][][][][][][][][][][][][][][][][][][][][][][][][][][][][][][][][][][]][][][][][][][][][][][][][][][][][][][][][][][[][][][][]  -  Test results slightly outside the reference range are not unusual. If there is anything important, I will review this with you,  otherwise it is considered normal test values.  If you have further questions,  please do not hesitate to contact me at the office or via My Chart.   [] [] [] [] [] [] [] [] [] [] [] [] [] [] [] [] [] [] [] [] [] [] [] [] [] [] [] [] [] [] [] [] [] [] [] [] [] [] [] [] [] ][] [] [] [] [] [] [] [] [] [] [] [] [] [] [] [] [] [] [] [] [] [] [[] [] [] [] []   -  Chol = 174  &   LDL = 90   Both  Excellent   - Very low risk for Heart Attack  / Stroke  [] [] [] [] [] [] [] [] [] [] [] [] [] [] [] [] [] [] [] [] [] [] [] [] [] [] [] [] [] [] [] [] [] [] [] [] [] [] [] [] [] ][] [] [] [] [] [] [] [] [] [] [] [] [] [] [] [] [] [] [] [] [] [] [[] [] [] [] []   -  A1c = 5.6% back in Normal  NonDiabetic Range  - Great  !  [] [] [] [] [] [] [] [] [] [] [] [] [] [] [] [] [] [] [] [] [] [] [] [] [] [] [] [] [] [] [] [] [] [] [] [] [] [] [] [] [] ][] [] [] [] [] [] [] [] [] [] [] [] [] [] [] [] [] [] [] [] [] [] [[] [] [] [] []   -  Vitamin D = 48 is low   - Vitamin D goal is between 70-100.   - Please make sure that you are taking your Vitamin D as directed.   - It is very important as a natural anti-inflammatory and helping the                          immune system protect against viral infections, like Flu  & the Covid    - Also helps hair, skin, and nails, as well as reducing stroke and heart attack risk.   - It helps your bones  &  and helps with mood.  - It also decreases numerous cancer risks, so please                                                                                           take it as directed.   - Low Vit D is associated with a 200-300% higher risk for CANCER   and 200-300% higher risk for HEART   ATTACK  &  STROKE.    - It is also associated with higher death rate at younger ages,   autoimmune diseases like Rheumatoid arthritis, Lupus, Multiple Sclerosis.     - Also many other serious  conditions, like depression, Alzheimer's Dementia                                                                             muscle aches, fatigue, fibromyalgia   [] [] [] [] [] [] [] [] [] [] [] [] [] [] [] [] [] [] [] [] [] [] [] [] [] [] [] [] [] [] [] [] [] [] [] [] [] [] [] [] [] ][] [] [] [] [] [] [] [] [] [] [] [] [] [] [] [] [] [] [] [] [] [] [[] [] [] [] []   - Uric acid is Normal -    Please continue Allopurinol   [] [] [] [] [] [] [] [] [] [] [] [] [] [] [] [] [] [] [] [] [] [] [] [] [] [] [] [] [] [] [] [] [] [] [] [] [] [] [] [] [] ][] [] [] [] [] [] [] [] [] [] [] [] [] [] [] [] [] [] [] [] [] [] [[] [] [] [] []   -  All Else - CBC - Kidneys - Electrolytes - Liver - Magnesium & Thyroid    - all  Normal / OK  [] [] [] [] [] [] [] [] [] [] [] [] [] [] [] [] [] [] [] [] [] [] [] [] [] [] [] [] [] [] [] [] [] [] [] [] [] [] [] [] [] ][] [] [] [] [] [] [] [] [] [] [] [] [] [] [] [] [] [] [] [] [] [] [[] [] [] [] []

## 2023-07-30 ENCOUNTER — Encounter: Payer: Self-pay | Admitting: Internal Medicine

## 2023-10-05 ENCOUNTER — Other Ambulatory Visit: Payer: Self-pay | Admitting: Cardiology

## 2023-10-05 DIAGNOSIS — E2839 Other primary ovarian failure: Secondary | ICD-10-CM

## 2023-10-05 DIAGNOSIS — Z1231 Encounter for screening mammogram for malignant neoplasm of breast: Secondary | ICD-10-CM

## 2023-10-14 ENCOUNTER — Ambulatory Visit: Payer: Medicare Other | Admitting: Cardiology

## 2023-10-20 ENCOUNTER — Ambulatory Visit: Payer: Medicare Other | Admitting: Cardiology

## 2023-10-27 ENCOUNTER — Ambulatory Visit: Payer: Medicare Other | Admitting: Nurse Practitioner

## 2023-11-01 ENCOUNTER — Ambulatory Visit: Payer: Medicare Other | Admitting: Nurse Practitioner

## 2023-12-07 NOTE — Progress Notes (Signed)
  Cardiology Office Note:   Date:  12/08/2023  ID:  Angelica Romero, DOB 1940/11/01, MRN [id number] PCP: Angelica Genet, MD  [facility] Providers Cardiologist:  None {  History of Present Illness:   Angelica Romero is a 83 y.o. female who is referred by Angelica Genet, MD for evaluation of a murmur.  She had moderate AS on echo in 2024.  Since I last saw her she has no new cardiac complaints.  She is active in her yard.  She does chair yoga.  The patient denies any new symptoms such as chest discomfort, neck or arm discomfort. There has been no new shortness of breath, PND or orthopnea. There have been no reported palpitations, presyncope or syncope.  ROS: As stated in the HPI and negative for all other systems.  Studies Reviewed:    EKG:   EKG Interpretation Date/Time:  Thursday Dec 08 2023 10:38:24 EDT Ventricular Rate:  57 PR Interval:  200 QRS Duration:  80 QT Interval:  422 QTC Calculation: 410 R Axis:   13  Text Interpretation: Sinus bradycardia Low voltage QRS Poor anterior R wave progression When compared with ECG of 19-Jul-2021 12:45, Vent. rate has decreased BY  30 BPM Confirmed by Angelica Romero ([id number]) on 12/08/2023 10:40:29 AM    Risk Assessment/Calculations:              Physical Exam:   VS:  BP 124/66   Pulse (!) 57   Ht 5' (1.524 m)   Wt 165 lb 3.2 oz (74.9 kg)   SpO2 99%   BMI 32.26 kg/m    Wt Readings from Last 3 Encounters:  12/08/23 165 lb 3.2 oz (74.9 kg)  07/28/23 164 lb 4.8 oz (74.5 kg)  04/28/23 162 lb 12.8 oz (73.8 kg)     GEN: Well nourished, well developed in no acute distress NECK: No JVD; No carotid bruits CARDIAC: RRR,   3 out of 6 apical systolic murmur radiating at the aortic outflow tract, no diastolic murmurs, rubs, gallops RESPIRATORY:  Clear to auscultation without rales, wheezing or rhonchi  ABDOMEN: Soft, non-tender, non-distended EXTREMITIES:  No edema; No deformity   ASSESSMENT AND PLAN:   AS:   She has no symptoms  related to this.  This was mild to moderate last year.  I will follow-up with an echocardiogram in April 2026 but she will let me know if she has any symptoms prior to that.  Hypertension: Her blood pressure is at target.  No change in therapy.    Follow up with me after the echo next year.  Signed, Angelica Grates, MD

## 2023-12-08 ENCOUNTER — Encounter: Payer: Self-pay | Admitting: Cardiology

## 2023-12-08 ENCOUNTER — Ambulatory Visit: Payer: Medicare Other | Attending: Cardiology | Admitting: Cardiology

## 2023-12-08 VITALS — BP 124/66 | HR 57 | Ht 60.0 in | Wt 165.2 lb

## 2023-12-08 DIAGNOSIS — I350 Nonrheumatic aortic (valve) stenosis: Secondary | ICD-10-CM

## 2023-12-08 DIAGNOSIS — I10 Essential (primary) hypertension: Secondary | ICD-10-CM

## 2023-12-08 NOTE — Patient Instructions (Signed)
 Medication Instructions:  Your physician recommends that you continue on your current medications as directed. Please refer to the Current Medication list given to you today.    *If you need a refill on your cardiac medications before your next appointment, please call your pharmacy*   Lab Work: NONE    If you have labs (blood work) drawn today and your tests are completely normal, you will receive your results only by: MyChart Message (if you have MyChart) OR A paper copy in the mail If you have any lab test that is abnormal or we need to change your treatment, we will call you to review the results.   Testing/Procedures:  Echo will be scheduled at [facility] [address] NEXT YEAR IN APRIL 2026.  Your physician has requested that you have an echocardiogram. Echocardiography is a painless test that uses sound waves to create images of your heart. It provides your doctor with information about the size and shape of your heart and how well your heart's chambers and valves are working. This procedure takes approximately one hour. There are no restrictions for this procedure. Please do NOT wear cologne, perfume, aftershave, or lotions (deodorant is allowed). Please arrive 15 minutes prior to your appointment time.    Follow-Up: At [facility], you and your health needs are our priority.  As part of our continuing mission to provide you with exceptional heart care, we have created designated Provider Care Teams.  These Care Teams include your primary Cardiologist (physician) and Advanced Practice Providers (APPs -  Physician Assistants and Nurse Practitioners) who all work together to provide you with the care you need, when you need it.  We recommend signing up for the patient portal called "MyChart".  Sign up information is provided on this After Visit Summary.  MyChart is used to connect with patients for Virtual Visits (Telemedicine).  Patients are able to view lab/test results,  encounter notes, upcoming appointments, etc.  Non-urgent messages can be sent to your provider as well.   To learn more about what you can do with MyChart, go to [email or web address].    Your next appointment:   1 year(s)  The format for your next appointment:   In Person  Provider:   Eilleen Grates, MD    Other Instructions

## 2023-12-09 ENCOUNTER — Ambulatory Visit
Admission: RE | Admit: 2023-12-09 | Discharge: 2023-12-09 | Disposition: A | Discharge status: Home / Self Care | Payer: Medicare Other | Source: Ambulatory Visit | Attending: Nurse Practitioner | Admitting: Nurse Practitioner

## 2023-12-09 DIAGNOSIS — E2839 Other primary ovarian failure: Secondary | ICD-10-CM

## 2023-12-09 DIAGNOSIS — Z1231 Encounter for screening mammogram for malignant neoplasm of breast: Secondary | ICD-10-CM

## 2023-12-14 ENCOUNTER — Other Ambulatory Visit: Payer: Self-pay | Admitting: Cardiology

## 2023-12-14 DIAGNOSIS — R928 Other abnormal and inconclusive findings on diagnostic imaging of breast: Secondary | ICD-10-CM

## 2023-12-29 ENCOUNTER — Ambulatory Visit
Admission: RE | Admit: 2023-12-29 | Discharge: 2023-12-29 | Disposition: A | Discharge status: Home / Self Care | Source: Ambulatory Visit | Attending: Cardiology | Admitting: Cardiology

## 2023-12-29 ENCOUNTER — Other Ambulatory Visit: Payer: Self-pay | Admitting: Cardiology

## 2023-12-29 DIAGNOSIS — N6320 Unspecified lump in the left breast, unspecified quadrant: Secondary | ICD-10-CM

## 2023-12-29 DIAGNOSIS — R928 Other abnormal and inconclusive findings on diagnostic imaging of breast: Secondary | ICD-10-CM

## 2024-02-02 ENCOUNTER — Encounter: Payer: Medicare Other | Admitting: Internal Medicine

## 2024-02-07 ENCOUNTER — Encounter: Payer: Medicare Other | Admitting: Internal Medicine

## 2024-03-28 DIAGNOSIS — J329 Chronic sinusitis, unspecified: Secondary | ICD-10-CM | POA: Diagnosis not present

## 2024-03-28 DIAGNOSIS — R42 Dizziness and giddiness: Secondary | ICD-10-CM | POA: Diagnosis not present

## 2024-03-28 DIAGNOSIS — D509 Iron deficiency anemia, unspecified: Secondary | ICD-10-CM | POA: Diagnosis not present

## 2024-03-28 DIAGNOSIS — L659 Nonscarring hair loss, unspecified: Secondary | ICD-10-CM | POA: Diagnosis not present

## 2024-03-28 DIAGNOSIS — R7309 Other abnormal glucose: Secondary | ICD-10-CM | POA: Diagnosis not present

## 2024-05-01 ENCOUNTER — Ambulatory Visit: Payer: Medicare Other | Admitting: Nurse Practitioner

## 2024-05-07 ENCOUNTER — Ambulatory Visit: Payer: Medicare Other | Admitting: Nurse Practitioner

## 2024-05-10 ENCOUNTER — Ambulatory Visit: Payer: Medicare Other | Admitting: Nurse Practitioner

## 2024-05-11 ENCOUNTER — Ambulatory Visit: Payer: Medicare Other | Admitting: Nurse Practitioner

## 2024-07-02 ENCOUNTER — Ambulatory Visit
Admission: RE | Admit: 2024-07-02 | Discharge: 2024-07-02 | Disposition: A | Discharge status: Home / Self Care | Source: Ambulatory Visit | Attending: Cardiology | Admitting: Cardiology

## 2024-07-02 ENCOUNTER — Other Ambulatory Visit

## 2024-07-02 ENCOUNTER — Encounter

## 2024-07-02 DIAGNOSIS — N6320 Unspecified lump in the left breast, unspecified quadrant: Secondary | ICD-10-CM

## 2024-07-07 ENCOUNTER — Ambulatory Visit: Payer: Self-pay | Admitting: Cardiology

## 2024-11-09 ENCOUNTER — Other Ambulatory Visit (HOSPITAL_COMMUNITY)
